# Patient Record
Sex: Male | Born: 1945 | Race: White | Hispanic: No | Marital: Single | Smoking: Current every day smoker
Health system: Southern US, Community
[De-identification: ages and names within clinical notes are randomized; demographics above are authoritative.]

## PROBLEM LIST (undated history)

## (undated) DIAGNOSIS — Z9989 Dependence on other enabling machines and devices: Secondary | ICD-10-CM

## (undated) DIAGNOSIS — R350 Frequency of micturition: Secondary | ICD-10-CM

## (undated) DIAGNOSIS — N210 Calculus in bladder: Secondary | ICD-10-CM

## (undated) DIAGNOSIS — F329 Major depressive disorder, single episode, unspecified: Secondary | ICD-10-CM

## (undated) DIAGNOSIS — Z973 Presence of spectacles and contact lenses: Secondary | ICD-10-CM

## (undated) DIAGNOSIS — G4733 Obstructive sleep apnea (adult) (pediatric): Secondary | ICD-10-CM

## (undated) DIAGNOSIS — E785 Hyperlipidemia, unspecified: Secondary | ICD-10-CM

## (undated) DIAGNOSIS — N281 Cyst of kidney, acquired: Secondary | ICD-10-CM

## (undated) DIAGNOSIS — Z87442 Personal history of urinary calculi: Secondary | ICD-10-CM

## (undated) DIAGNOSIS — F32A Depression, unspecified: Secondary | ICD-10-CM

## (undated) DIAGNOSIS — I10 Essential (primary) hypertension: Secondary | ICD-10-CM

## (undated) HISTORY — PX: TONSILLECTOMY: SUR1361

---

## 2000-02-19 ENCOUNTER — Encounter: Admission: RE | Admit: 2000-02-19 | Discharge: 2000-02-19 | Payer: Self-pay | Admitting: Neurology

## 2000-02-19 ENCOUNTER — Encounter: Payer: Self-pay | Admitting: Neurology

## 2004-02-12 HISTORY — PX: COLONOSCOPY: SHX174

## 2004-05-08 ENCOUNTER — Encounter: Admission: RE | Admit: 2004-05-08 | Discharge: 2004-05-08 | Payer: Self-pay | Admitting: Neurology

## 2008-02-29 ENCOUNTER — Emergency Department (HOSPITAL_COMMUNITY): Admission: EM | Admit: 2008-02-29 | Discharge: 2008-02-29 | Payer: Self-pay | Admitting: Emergency Medicine

## 2010-05-28 LAB — URINE MICROSCOPIC-ADD ON

## 2010-05-28 LAB — URINALYSIS, ROUTINE W REFLEX MICROSCOPIC
Bilirubin Urine: NEGATIVE
Glucose, UA: NEGATIVE mg/dL
Ketones, ur: NEGATIVE mg/dL
Nitrite: NEGATIVE
Protein, ur: NEGATIVE mg/dL
Specific Gravity, Urine: 1.022 (ref 1.005–1.030)
Urobilinogen, UA: 0.2 mg/dL (ref 0.0–1.0)
pH: 7 (ref 5.0–8.0)

## 2013-10-20 ENCOUNTER — Other Ambulatory Visit: Payer: Self-pay | Admitting: Internal Medicine

## 2013-10-20 DIAGNOSIS — R42 Dizziness and giddiness: Secondary | ICD-10-CM

## 2013-10-23 ENCOUNTER — Ambulatory Visit
Admission: RE | Admit: 2013-10-23 | Discharge: 2013-10-23 | Disposition: A | Discharge status: Home / Self Care | Payer: Medicare Other | Source: Ambulatory Visit | Attending: Internal Medicine | Admitting: Internal Medicine

## 2013-10-23 DIAGNOSIS — R42 Dizziness and giddiness: Secondary | ICD-10-CM

## 2013-10-28 ENCOUNTER — Ambulatory Visit
Admission: RE | Admit: 2013-10-28 | Discharge: 2013-10-28 | Disposition: A | Discharge status: Home / Self Care | Payer: Medicare Other | Source: Ambulatory Visit | Attending: Internal Medicine | Admitting: Internal Medicine

## 2013-10-28 ENCOUNTER — Encounter (INDEPENDENT_AMBULATORY_CARE_PROVIDER_SITE_OTHER): Payer: Self-pay

## 2013-10-28 DIAGNOSIS — R42 Dizziness and giddiness: Secondary | ICD-10-CM

## 2014-08-14 ENCOUNTER — Encounter (HOSPITAL_COMMUNITY): Payer: Self-pay | Admitting: Adult Health

## 2014-08-14 ENCOUNTER — Emergency Department (HOSPITAL_COMMUNITY): Payer: PPO

## 2014-08-14 ENCOUNTER — Emergency Department (HOSPITAL_COMMUNITY)
Admission: EM | Admit: 2014-08-14 | Discharge: 2014-08-14 | Disposition: A | Discharge status: Home / Self Care | Payer: PPO | Attending: Emergency Medicine | Admitting: Emergency Medicine

## 2014-08-14 DIAGNOSIS — Z720 Tobacco use: Secondary | ICD-10-CM | POA: Insufficient documentation

## 2014-08-14 DIAGNOSIS — M25511 Pain in right shoulder: Secondary | ICD-10-CM | POA: Insufficient documentation

## 2014-08-14 DIAGNOSIS — I10 Essential (primary) hypertension: Secondary | ICD-10-CM | POA: Diagnosis not present

## 2014-08-14 DIAGNOSIS — R202 Paresthesia of skin: Secondary | ICD-10-CM | POA: Insufficient documentation

## 2014-08-14 HISTORY — DX: Essential (primary) hypertension: I10

## 2014-08-14 MED ORDER — KETOROLAC TROMETHAMINE 60 MG/2ML IM SOLN
60.0000 mg | Freq: Once | INTRAMUSCULAR | Status: AC
  Administered 2014-08-14: 60 mg via INTRAMUSCULAR
  Filled 2014-08-14: qty 2

## 2014-08-14 MED ORDER — HYDROCODONE-ACETAMINOPHEN 5-325 MG PO TABS: 1.0000 | ORAL_TABLET | ORAL | Status: DC | PRN

## 2014-08-14 MED ORDER — PREDNISONE 20 MG PO TABS
60.0000 mg | ORAL_TABLET | Freq: Once | ORAL | Status: AC
  Administered 2014-08-14: 60 mg via ORAL
  Filled 2014-08-14: qty 3

## 2014-08-14 MED ORDER — IBUPROFEN 800 MG PO TABS: 800.0000 mg | ORAL_TABLET | Freq: Three times a day (TID) | ORAL | Status: DC

## 2014-08-14 MED ORDER — PREDNISONE 20 MG PO TABS: 40.0000 mg | ORAL_TABLET | Freq: Every day | ORAL | Status: DC

## 2014-08-14 NOTE — ED Provider Notes (Signed)
CSN: [id number]     Arrival date & time 08/14/14  1517 History  This chart was scribed for non-physician practitioner, Delsa Grana, PA-C, working with Quintella Reichert, MD, by Stephania Fragmin, ED Scribe. This patient was seen in room TR06C/TR06C and the patient's care was started at 4:34 PM.    Chief Complaint  Patient presents with  . Shoulder Pain   The history is provided by the patient. No language interpreter was used.    HPI Comments: Stephen Collins is a 69 y.o. male who presents to the Emergency Department complaining of constant, 9/10 right shoulder pain that began 3 weeks ago, resolved for a period of time, and returned yesterday, gradually worsening since. He complains of an associated tingling sensation in all his right fingers, although he adds he thinks this may be due to moving his right arm less from his discomfort. He denies any known trauma or injury. Patient states he does fly-fishing, which involves some repetitive motion, although he last fished 1 week ago. Certain movements exacerbate his pain. Patient took 2 Advil tablets this morning with only some temporary relief. He denies fever, chills, neck pain, back pain, SOB, or chest pain. Patient has NKDA.   Past Medical History  Diagnosis Date  . Hypertension    History reviewed. No pertinent past surgical history. History reviewed. No pertinent family history. History  Substance Use Topics  . Smoking status: Current Every Day Smoker    Types: Cigarettes  . Smokeless tobacco: Not on file  . Alcohol Use: Yes    Review of Systems  Constitutional: Negative for fever and chills.  Respiratory: Negative for shortness of breath.   Cardiovascular: Negative for chest pain.  Musculoskeletal: Positive for arthralgias (right shoulder pain). Negative for back pain and neck pain.   Allergies  Review of patient's allergies indicates no known allergies.  Home Medications   Prior to Admission medications   Not on File   BP 155/82  mmHg  Pulse 94  Temp(Src) 98.4 F (36.9 C) (Oral)  Resp 18  SpO2 94% Physical Exam  Constitutional: He is oriented to person, place, and time. He appears well-developed and well-nourished. No distress.  HENT:  Head: Normocephalic and atraumatic.  Right Ear: External ear normal.  Left Ear: External ear normal.  Nose: Nose normal.  Mouth/Throat: Oropharynx is clear and moist.  Eyes: Conjunctivae and EOM are normal. Pupils are equal, round, and reactive to light. Right eye exhibits no discharge. Left eye exhibits no discharge. No scleral icterus.  Neck: Normal range of motion. Neck supple. No tracheal deviation present.  Cardiovascular: Normal rate and regular rhythm.  Exam reveals no gallop and no friction rub.   No murmur heard. Pulses:      Radial pulses are 2+ on the right side, and 2+ on the left side.  Pulmonary/Chest: Effort normal and breath sounds normal. No stridor. No respiratory distress. He has no wheezes. He has no rales. He exhibits no tenderness.  Abdominal: Soft.  Musculoskeletal: He exhibits tenderness.       Cervical back: He exhibits normal range of motion, no tenderness, no bony tenderness, no swelling, no edema, no deformity and no spasm.       Thoracic back: Normal.  Sharp/dull intact upper extremities bilaterally Radial pulses 2+ Right shoulder No bony tenderness to clavicle, [place] joint, AC joint, and over scapula.  Tender over bicepital groove and glenohumeral joint.  No obvious effusion. No deformity.  No visible redness or swelling.  ROM limited  do to pain, normal passive ROM Right elbow normal ROM Right wrist normal ROM  Neurological: He is alert and oriented to person, place, and time. He exhibits normal muscle tone.  Skin: Skin is warm and dry. No rash noted. He is not diaphoretic. No erythema. No pallor.  Psychiatric: He has a normal mood and affect. His behavior is normal.  Nursing note and vitals reviewed.   ED Course  Procedures (including critical  care time)  DIAGNOSTIC STUDIES: Oxygen Saturation is 94% on RA, normal by my interpretation.    COORDINATION OF CARE: 4:41 PM - Discussed treatment plan with pt at bedside which includes Rx steroid taper (prednisone) and pain medication (Norco), as well as Toradol injection administered here to reduce inflammation, and pt agreed to plan.  Imaging Review Dg Shoulder Right  08/14/2014   CLINICAL DATA:  Right shoulder pain for 3 weeks.  EXAM: RIGHT SHOULDER - 2+ VIEW  COMPARISON:  None.  FINDINGS: There is no evidence of fracture or dislocation. There is no evidence of arthropathy or other focal bone abnormality. Soft tissues are unremarkable.  IMPRESSION: Negative right shoulder radiographs.   Electronically Signed   By: San Morelle M.D.   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

## 2014-08-14 NOTE — Discharge Instructions (Signed)
Acromioclavicular Injuries The acromioclavicular [facility]) joint is the joint in the shoulder. There are many bands of tissue (ligaments) that surround the [facility] bones and joints. These bands of tissue can tear, which can lead to sprains and separations. The bones of the [facility] joint can also break (fracture).  HOME CARE   Put ice on the injured area.  Put ice in a plastic bag.  Place a towel between your skin and the bag.  Leave the ice on for 15-20 minutes, 03-04 times a day.  Wear your sling as told by your doctor. Remove the sling before showering and bathing. Keep the shoulder in the same place as when the sling is on. Do not lift the arm.  Gently tighten your figure-eight splint (if applied) every day. Tighten it enough to keep the shoulders held back. There should be room to place your finger between your body and the strap. Loosen the splint right away if you lose feeling (numbness) or have tingling in your hands.  Only take medicine as told by your doctor.  Keep all follow-up visits with your doctor. GET HELP RIGHT AWAY IF:   Your medicine does not help your pain.  You have more puffiness (swelling) or your bruising gets worse rather than better.  You were unable to follow up as told by your doctor.  You have tingling or lose even more feeling in your arm, forearm, or hand.  Your arm is cold or pale.  You have more pain in the hand, forearm, or fingers. MAKE SURE YOU:   Understand these instructions.  Will watch your condition.  Will get help right away if you are not doing well or get worse. Document Released: 07/18/2009 Document Revised: 04/22/2011 Document Reviewed: 07/18/2009 [facility] Patient Information 2015 [place], [place]. This information is not intended to replace advice given to you by your health care provider. Make sure you discuss any questions you have with your health care provider.  Musculoskeletal Pain Musculoskeletal pain is muscle and boney aches and pains.  These pains can occur in any part of the body. Your caregiver may treat you without knowing the cause of the pain. They may treat you if blood or urine tests, X-rays, and other tests were normal.  CAUSES There is often not a definite cause or reason for these pains. These pains may be caused by a type of germ (virus). The discomfort may also come from overuse. Overuse includes working out too hard when your body is not fit. Boney aches also come from weather changes. Bone is sensitive to atmospheric pressure changes. HOME CARE INSTRUCTIONS   Ask when your test results will be ready. Make sure you get your test results.  Only take over-the-counter or prescription medicines for pain, discomfort, or fever as directed by your caregiver. If you were given medications for your condition, do not drive, operate machinery or power tools, or sign legal documents for 24 hours. Do not drink alcohol. Do not take sleeping pills or other medications that may interfere with treatment.  Continue all activities unless the activities cause more pain. When the pain lessens, slowly resume normal activities. Gradually increase the intensity and duration of the activities or exercise.  During periods of severe pain, bed rest may be helpful. Lay or sit in any position that is comfortable.  Putting ice on the injured area.  Put ice in a bag.  Place a towel between your skin and the bag.  Leave the ice on for 15 to 20  minutes, 3 to 4 times a day.  Follow up with your caregiver for continued problems and no reason can be found for the pain. If the pain becomes worse or does not go away, it may be necessary to repeat tests or do additional testing. Your caregiver may need to look further for a possible cause. SEEK IMMEDIATE MEDICAL CARE IF:  You have pain that is getting worse and is not relieved by medications.  You develop chest pain that is associated with shortness or breath, sweating, feeling sick to your stomach  (nauseous), or throw up (vomit).  Your pain becomes localized to the abdomen.  You develop any new symptoms that seem different or that concern you. MAKE SURE YOU:   Understand these instructions.  Will watch your condition.  Will get help right away if you are not doing well or get worse. Document Released: 01/28/2005 Document Revised: 04/22/2011 Document Reviewed: 10/02/2012 [facility] Patient Information 2015 [place], [place]. This information is not intended to replace advice given to you by your health care provider. Make sure you discuss any questions you have with your health care provider.  Heat Therapy Heat therapy can help ease sore, stiff, injured, and tight muscles and joints. Heat relaxes your muscles, which may help ease your pain.  RISKS AND COMPLICATIONS If you have any of the following conditions, do not use heat therapy unless your health care provider has approved:  Poor circulation.  Healing wounds or scarred skin in the area being treated.  Diabetes, heart disease, or high blood pressure.  Not being able to feel (numbness) the area being treated.  Unusual swelling of the area being treated.  Active infections.  Blood clots.  Cancer.  Inability to communicate pain. This may include young children and people who have problems with their brain function (dementia).  Pregnancy. Heat therapy should only be used on old, pre-existing, or long-lasting (chronic) injuries. Do not use heat therapy on new injuries unless directed by your health care provider. HOW TO USE HEAT THERAPY There are several different kinds of heat therapy, including:  Moist heat pack.  Warm water bath.  Hot water bottle.  Electric heating pad.  Heated gel pack.  Heated wrap.  Electric heating pad. Use the heat therapy method suggested by your health care provider. Follow your health care provider's instructions on when and how to use heat therapy. GENERAL HEAT THERAPY  RECOMMENDATIONS  Do not sleep while using heat therapy. Only use heat therapy while you are awake.  Your skin may turn pink while using heat therapy. Do not use heat therapy if your skin turns red.  Do not use heat therapy if you have new pain.  High heat or long exposure to heat can cause burns. Be careful when using heat therapy to avoid burning your skin.  Do not use heat therapy on areas of your skin that are already irritated, such as with a rash or sunburn. SEEK MEDICAL CARE IF:  You have blisters, redness, swelling, or numbness.  You have new pain.  Your pain is worse. MAKE SURE YOU:  Understand these instructions.  Will watch your condition.  Will get help right away if you are not doing well or get worse. Document Released: 04/22/2011 Document Revised: 06/14/2013 Document Reviewed: 03/23/2013 [facility] Patient Information 2015 [place], [place]. This information is not intended to replace advice given to you by your health care provider. Make sure you discuss any questions you have with your health care provider.  Shoulder Pain  The shoulder is the joint that connects your arm to your body. Muscles and band-like tissues that connect bones to muscles (tendons) hold the joint together. Shoulder pain is felt if an injury or medical problem affects one or more parts of the shoulder. HOME CARE   Put ice on the sore area.  Put ice in a plastic bag.  Place a towel between your skin and the bag.  Leave the ice on for 15-20 minutes, 03-04 times a day for the first 2 days.  Stop using cold packs if they do not help with the pain.  If you were given something to keep your shoulder from moving (sling; shoulder immobilizer), wear it as told. Only take it off to shower or bathe.  Move your arm as little as possible, but keep your hand moving to prevent puffiness (swelling).  Squeeze a soft ball or foam pad as much as possible to help prevent swelling.  Take medicine as told by  your doctor. GET HELP IF:  You have progressing new pain in your arm, hand, or fingers.  Your hand or fingers get cold.  Your medicine does not help lessen your pain. GET HELP RIGHT AWAY IF:   Your arm, hand, or fingers are numb or tingling.  Your arm, hand, or fingers are puffy (swollen), painful, or turn white or blue. MAKE SURE YOU:   Understand these instructions.  Will watch your condition.  Will get help right away if you are not doing well or get worse. Document Released: 07/17/2007 Document Revised: 06/14/2013 Document Reviewed: 08/12/2011 [facility] Patient Information 2015 [place], [place]. This information is not intended to replace advice given to you by your health care provider. Make sure you discuss any questions you have with your health care provider.

## 2014-08-14 NOTE — ED Notes (Addendum)
Presents with right shoulder and arm pain began a few weeks ago but has gotten worse-last night became severe and reports a slight tingling in hand denies pain in chest, denies SOB, denies nausea. Tried ice and ibuprofen with no relief. Raising shoulder up makes pain worse-denies injury

## 2014-08-14 NOTE — ED Notes (Signed)
Declined W/C at D/C and was escorted to lobby by RN. 

## 2015-02-27 DIAGNOSIS — N210 Calculus in bladder: Secondary | ICD-10-CM | POA: Diagnosis not present

## 2015-02-27 DIAGNOSIS — R3121 Asymptomatic microscopic hematuria: Secondary | ICD-10-CM | POA: Diagnosis not present

## 2015-02-28 ENCOUNTER — Other Ambulatory Visit: Payer: Self-pay | Admitting: Urology

## 2015-04-05 DIAGNOSIS — I10 Essential (primary) hypertension: Secondary | ICD-10-CM | POA: Diagnosis not present

## 2015-04-05 DIAGNOSIS — N210 Calculus in bladder: Secondary | ICD-10-CM | POA: Diagnosis not present

## 2015-04-05 DIAGNOSIS — E119 Type 2 diabetes mellitus without complications: Secondary | ICD-10-CM | POA: Diagnosis not present

## 2015-04-05 DIAGNOSIS — L989 Disorder of the skin and subcutaneous tissue, unspecified: Secondary | ICD-10-CM | POA: Diagnosis not present

## 2015-04-05 DIAGNOSIS — Z6833 Body mass index (BMI) 33.0-33.9, adult: Secondary | ICD-10-CM | POA: Diagnosis not present

## 2015-04-05 DIAGNOSIS — R5383 Other fatigue: Secondary | ICD-10-CM | POA: Diagnosis not present

## 2015-04-05 DIAGNOSIS — E784 Other hyperlipidemia: Secondary | ICD-10-CM | POA: Diagnosis not present

## 2015-04-05 DIAGNOSIS — G4733 Obstructive sleep apnea (adult) (pediatric): Secondary | ICD-10-CM | POA: Diagnosis not present

## 2015-04-07 ENCOUNTER — Encounter (HOSPITAL_BASED_OUTPATIENT_CLINIC_OR_DEPARTMENT_OTHER): Payer: Self-pay | Admitting: *Deleted

## 2015-04-07 DIAGNOSIS — N210 Calculus in bladder: Secondary | ICD-10-CM | POA: Diagnosis not present

## 2015-04-10 ENCOUNTER — Encounter (HOSPITAL_BASED_OUTPATIENT_CLINIC_OR_DEPARTMENT_OTHER): Payer: Self-pay | Admitting: *Deleted

## 2015-04-10 NOTE — Progress Notes (Signed)
NPO AFTER MN.  ARRIVE AT 0715.  NEEDS ISTAT AND EKG.  WILL TAKE AM MEDS W/ SIPS OF WATER DOS.

## 2015-04-14 ENCOUNTER — Encounter (HOSPITAL_BASED_OUTPATIENT_CLINIC_OR_DEPARTMENT_OTHER): Payer: Self-pay | Admitting: *Deleted

## 2015-04-14 ENCOUNTER — Ambulatory Visit (HOSPITAL_BASED_OUTPATIENT_CLINIC_OR_DEPARTMENT_OTHER)
Admission: RE | Admit: 2015-04-14 | Discharge: 2015-04-14 | Disposition: A | Discharge status: Home / Self Care | Payer: PPO | Source: Ambulatory Visit | Attending: Urology | Admitting: Urology

## 2015-04-14 ENCOUNTER — Other Ambulatory Visit: Payer: Self-pay

## 2015-04-14 ENCOUNTER — Ambulatory Visit (HOSPITAL_BASED_OUTPATIENT_CLINIC_OR_DEPARTMENT_OTHER): Payer: PPO | Admitting: Certified Registered"

## 2015-04-14 ENCOUNTER — Encounter (HOSPITAL_BASED_OUTPATIENT_CLINIC_OR_DEPARTMENT_OTHER)
Admission: RE | Disposition: A | Discharge status: Home / Self Care | Payer: Self-pay | Source: Ambulatory Visit | Attending: Urology

## 2015-04-14 DIAGNOSIS — I10 Essential (primary) hypertension: Secondary | ICD-10-CM | POA: Diagnosis not present

## 2015-04-14 DIAGNOSIS — R300 Dysuria: Secondary | ICD-10-CM | POA: Diagnosis not present

## 2015-04-14 DIAGNOSIS — N210 Calculus in bladder: Secondary | ICD-10-CM | POA: Diagnosis not present

## 2015-04-14 DIAGNOSIS — G4730 Sleep apnea, unspecified: Secondary | ICD-10-CM | POA: Insufficient documentation

## 2015-04-14 DIAGNOSIS — Z87891 Personal history of nicotine dependence: Secondary | ICD-10-CM | POA: Insufficient documentation

## 2015-04-14 DIAGNOSIS — F329 Major depressive disorder, single episode, unspecified: Secondary | ICD-10-CM | POA: Insufficient documentation

## 2015-04-14 DIAGNOSIS — E669 Obesity, unspecified: Secondary | ICD-10-CM | POA: Diagnosis not present

## 2015-04-14 DIAGNOSIS — Z6832 Body mass index (BMI) 32.0-32.9, adult: Secondary | ICD-10-CM | POA: Insufficient documentation

## 2015-04-14 DIAGNOSIS — Z79899 Other long term (current) drug therapy: Secondary | ICD-10-CM | POA: Insufficient documentation

## 2015-04-14 DIAGNOSIS — E119 Type 2 diabetes mellitus without complications: Secondary | ICD-10-CM | POA: Diagnosis not present

## 2015-04-14 DIAGNOSIS — E785 Hyperlipidemia, unspecified: Secondary | ICD-10-CM | POA: Diagnosis not present

## 2015-04-14 DIAGNOSIS — R3915 Urgency of urination: Secondary | ICD-10-CM | POA: Diagnosis not present

## 2015-04-14 HISTORY — DX: Calculus in bladder: N21.0

## 2015-04-14 HISTORY — DX: Dependence on other enabling machines and devices: Z99.89

## 2015-04-14 HISTORY — PX: CYSTOSCOPY WITH LITHOLAPAXY: SHX1425

## 2015-04-14 HISTORY — DX: Obstructive sleep apnea (adult) (pediatric): G47.33

## 2015-04-14 HISTORY — DX: Frequency of micturition: R35.0

## 2015-04-14 HISTORY — DX: Presence of spectacles and contact lenses: Z97.3

## 2015-04-14 HISTORY — DX: Major depressive disorder, single episode, unspecified: F32.9

## 2015-04-14 HISTORY — DX: Hyperlipidemia, unspecified: E78.5

## 2015-04-14 HISTORY — DX: Depression, unspecified: F32.A

## 2015-04-14 HISTORY — DX: Cyst of kidney, acquired: N28.1

## 2015-04-14 HISTORY — DX: Personal history of urinary calculi: Z87.442

## 2015-04-14 LAB — POCT I-STAT 4, (NA,K, GLUC, HGB,HCT)
Glucose, Bld: 122 mg/dL — ABNORMAL HIGH (ref 65–99)
HCT: 46 % (ref 39.0–52.0)
HEMOGLOBIN: 15.6 g/dL (ref 13.0–17.0)
POTASSIUM: 4 mmol/L (ref 3.5–5.1)
Sodium: 142 mmol/L (ref 135–145)

## 2015-04-14 SURGERY — CYSTOSCOPY, WITH BLADDER CALCULUS LITHOLAPAXY
Anesthesia: General

## 2015-04-14 MED ORDER — FENTANYL CITRATE (PF) 100 MCG/2ML IJ SOLN
INTRAMUSCULAR | Status: AC
Start: 2015-04-14 — End: 2015-04-14
  Filled 2015-04-14: qty 2

## 2015-04-14 MED ORDER — LIDOCAINE HCL (CARDIAC) 20 MG/ML IV SOLN
INTRAVENOUS | Status: DC | PRN
  Administered 2015-04-14: 60 mg via INTRAVENOUS

## 2015-04-14 MED ORDER — DEXAMETHASONE SODIUM PHOSPHATE 10 MG/ML IJ SOLN
INTRAMUSCULAR | Status: AC
  Filled 2015-04-14: qty 1

## 2015-04-14 MED ORDER — CIPROFLOXACIN IN D5W 400 MG/200ML IV SOLN
INTRAVENOUS | Status: AC
  Filled 2015-04-14: qty 200

## 2015-04-14 MED ORDER — PROPOFOL 10 MG/ML IV BOLUS
INTRAVENOUS | Status: AC
  Filled 2015-04-14: qty 20

## 2015-04-14 MED ORDER — STERILE WATER FOR IRRIGATION IR SOLN
Status: DC | PRN
  Administered 2015-04-14: 1000 mL via INTRAVESICAL
  Administered 2015-04-14: 500 mL via INTRAVESICAL
  Administered 2015-04-14: 3000 mL via INTRAVESICAL

## 2015-04-14 MED ORDER — TRAMADOL HCL 50 MG PO TABS
50.0000 mg | ORAL_TABLET | Freq: Once | ORAL | Status: DC
  Filled 2015-04-14: qty 1

## 2015-04-14 MED ORDER — LIDOCAINE HCL 2 % EX GEL
CUTANEOUS | Status: DC | PRN
  Administered 2015-04-14: 1 via URETHRAL

## 2015-04-14 MED ORDER — EPHEDRINE SULFATE 50 MG/ML IJ SOLN
INTRAMUSCULAR | Status: DC | PRN
  Administered 2015-04-14: 15 mg via INTRAVENOUS
  Administered 2015-04-14 (×2): 10 mg via INTRAVENOUS

## 2015-04-14 MED ORDER — LACTATED RINGERS IV SOLN
INTRAVENOUS | Status: DC
  Administered 2015-04-14: 08:00:00 via INTRAVENOUS
  Filled 2015-04-14: qty 1000

## 2015-04-14 MED ORDER — PROPOFOL 10 MG/ML IV BOLUS
INTRAVENOUS | Status: DC | PRN
  Administered 2015-04-14: 150 mg via INTRAVENOUS
  Administered 2015-04-14: 50 mg via INTRAVENOUS

## 2015-04-14 MED ORDER — OXYCODONE HCL 5 MG/5ML PO SOLN
5.0000 mg | Freq: Once | ORAL | Status: DC | PRN
  Filled 2015-04-14: qty 5

## 2015-04-14 MED ORDER — LIDOCAINE HCL (CARDIAC) 20 MG/ML IV SOLN
INTRAVENOUS | Status: AC
  Filled 2015-04-14: qty 5

## 2015-04-14 MED ORDER — ONDANSETRON HCL 4 MG/2ML IJ SOLN
4.0000 mg | Freq: Four times a day (QID) | INTRAMUSCULAR | Status: DC | PRN
  Filled 2015-04-14: qty 2

## 2015-04-14 MED ORDER — PHENAZOPYRIDINE HCL 200 MG PO TABS: 200.0000 mg | ORAL_TABLET | Freq: Three times a day (TID) | ORAL | Status: DC | PRN

## 2015-04-14 MED ORDER — PHENAZOPYRIDINE HCL 200 MG PO TABS
200.0000 mg | ORAL_TABLET | Freq: Once | ORAL | Status: DC
  Filled 2015-04-14: qty 1

## 2015-04-14 MED ORDER — FENTANYL CITRATE (PF) 100 MCG/2ML IJ SOLN
INTRAMUSCULAR | Status: DC | PRN
Start: 2015-04-14 — End: 2015-04-14
  Administered 2015-04-14: 25 ug via INTRAVENOUS
  Administered 2015-04-14: 50 ug via INTRAVENOUS

## 2015-04-14 MED ORDER — OXYCODONE HCL 5 MG PO TABS
5.0000 mg | ORAL_TABLET | Freq: Once | ORAL | Status: DC | PRN
  Filled 2015-04-14: qty 1

## 2015-04-14 MED ORDER — FENTANYL CITRATE (PF) 100 MCG/2ML IJ SOLN
25.0000 ug | INTRAMUSCULAR | Status: DC | PRN
  Filled 2015-04-14: qty 1

## 2015-04-14 MED ORDER — CIPROFLOXACIN IN D5W 400 MG/200ML IV SOLN
400.0000 mg | INTRAVENOUS | Status: AC
  Administered 2015-04-14: 400 mg via INTRAVENOUS
  Filled 2015-04-14: qty 200

## 2015-04-14 MED ORDER — ONDANSETRON HCL 4 MG/2ML IJ SOLN
INTRAMUSCULAR | Status: AC
  Filled 2015-04-14: qty 2

## 2015-04-14 MED ORDER — TRAMADOL HCL 50 MG PO TABS: 50.0000 mg | ORAL_TABLET | Freq: Four times a day (QID) | ORAL | Status: DC | PRN

## 2015-04-14 MED ORDER — SODIUM CHLORIDE 0.9 % IR SOLN
Status: DC | PRN
  Administered 2015-04-14 (×3): 1000 mL via INTRAVESICAL
  Administered 2015-04-14: 3000 mL via INTRAVESICAL
  Administered 2015-04-14 (×5): 1000 mL via INTRAVESICAL

## 2015-04-14 MED ORDER — DEXAMETHASONE SODIUM PHOSPHATE 4 MG/ML IJ SOLN
INTRAMUSCULAR | Status: DC | PRN
  Administered 2015-04-14: 4 mg via INTRAVENOUS
  Administered 2015-04-14: 10 mg via INTRAVENOUS

## 2015-04-14 SURGICAL SUPPLY — 21 items
BAG DRAIN URO-CYSTO SKYTR STRL (DRAIN) ×2 IMPLANT
CANISTER SUCT LVC 12 LTR MEDI- (MISCELLANEOUS) IMPLANT
CLOTH BEACON ORANGE TIMEOUT ST (SAFETY) ×2 IMPLANT
DRSG TELFA 3X8 NADH (GAUZE/BANDAGES/DRESSINGS) IMPLANT
ELECT REM PT RETURN 9FT ADLT (ELECTROSURGICAL)
ELECTRODE REM PT RTRN 9FT ADLT (ELECTROSURGICAL) IMPLANT
EVACUATOR MICROVAS BLADDER (UROLOGICAL SUPPLIES) ×2 IMPLANT
FIBER LASER FLEXIVA 1000 (UROLOGICAL SUPPLIES) ×2 IMPLANT
GLOVE BIO SURGEON STRL SZ7.5 (GLOVE) ×2 IMPLANT
GOWN STRL REUS W/ TWL XL LVL3 (GOWN DISPOSABLE) ×1 IMPLANT
GOWN STRL REUS W/TWL XL LVL3 (GOWN DISPOSABLE) ×1
IV NS 1000ML (IV SOLUTION) ×9
IV NS 1000ML BAXH (IV SOLUTION) ×9 IMPLANT
IV NS IRRIG 3000ML ARTHROMATIC (IV SOLUTION) ×2 IMPLANT
KIT ROOM TURNOVER WOR (KITS) ×2 IMPLANT
MANIFOLD NEPTUNE II (INSTRUMENTS) ×2 IMPLANT
NS IRRIG 500ML POUR BTL (IV SOLUTION) IMPLANT
PACK CYSTO (CUSTOM PROCEDURE TRAY) ×2 IMPLANT
TUBE CONNECTING 12X1/4 (SUCTIONS) ×2 IMPLANT
WATER STERILE IRR 3000ML UROMA (IV SOLUTION) ×2 IMPLANT
WATER STERILE IRR 500ML POUR (IV SOLUTION) ×2 IMPLANT

## 2015-04-14 NOTE — Op Note (Signed)
Preoperative diagnosis:  1. Bladder stones   Postoperative diagnosis:  1. same   Procedure:  1. Cystolithalopaxy > 2.0cm  Surgeon: Ardis Hughs, MD  Anesthesia: Gen.   Complications: None  Intraoperative findings:  the patient had 2 large bladder stones approximately 1.5 cm each and several small stones within his bladder. All stone fragments were removed. The patient had kissing obstructing lateral lobes of his prostate as well as a very high median bar.  EBL: Minimal  Specimens:  The bladder stones were taken to the [facility] for further stone analysis.   Indication: Stephen Collins is a 70 y.o. patient with  large bladder stones that were found as part of a hematuria workup.   After reviewing the management options for treatment, he elected to proceed with the above surgical procedure(s). We have discussed the potential benefits and risks of the procedure, side effects of the proposed treatment, the likelihood of the patient achieving the goals of the procedure, and any potential problems that might occur during the procedure or recuperation. Informed consent has been obtained.  The patient did decline performing a TURP simultaneously.   Description of procedure:  The patient was taken to the operating room and general anesthesia was induced.  The patient was placed in the dorsal lithotomy position, prepped and draped in the usual sterile fashion, and preoperative antibiotics were administered. A preoperative time-out was performed.   Cystourethroscopy was performed.  The patient's urethra was examined and was normal bilobar prostatic hypertrophy with a median lobe. The bladder was then systematically examined in its entirety. There was no evidence for any bladder tumors or other mucosal pathology.   The bladder stones were noted as above.    Then using the 1000  laser fiber with settings of 1 J and 50 Hz the bladder stones were subsequently and systematically  fragmented into small enough fragments as they could be evacuated through the cystoscopic sheath. Once the stone fragments had been completely removed, I then performed a second cystoscopic evaluation to ensure that there were no additional tumors or mucosal abnormalities. There were several small laser burns on the posterior wall, these were minor in nature.  The bladder was then emptied, the scope removed,  and the procedure ended.  The patient appeared to tolerate the procedure well and without complications.  The patient was able to be awakened and transferred to the recovery unit in satisfactory condition.    Ardis Hughs, M.D.

## 2015-04-14 NOTE — Anesthesia Procedure Notes (Addendum)
Procedure Name: LMA Insertion Date/Time: 04/14/2015 8:42 AM Performed by: Denna Haggard D Pre-anesthesia Checklist: Patient identified, Emergency Drugs available, Suction available and Patient being monitored Patient Re-evaluated:Patient Re-evaluated prior to inductionOxygen Delivery Method: Circle System Utilized Preoxygenation: Pre-oxygenation with 100% oxygen Intubation Type: IV induction Ventilation: Mask ventilation without difficulty LMA: LMA inserted LMA Size: 4.0 Number of attempts: 1 Airway Equipment and Method: bite block Placement Confirmation: positive ETCO2 Tube secured with: Tape Dental Injury: Teeth and Oropharynx as per pre-operative assessment

## 2015-04-14 NOTE — Anesthesia Preprocedure Evaluation (Signed)
Anesthesia Evaluation  Patient identified by MRN, date of birth, ID band Patient awake    Reviewed: Allergy & Precautions, NPO status , Patient's Chart, lab work & pertinent test results  Airway Mallampati: II   Neck ROM: full    Dental   Pulmonary sleep apnea and Continuous Positive Airway Pressure Ventilation , Current Smoker,    breath sounds clear to auscultation       Cardiovascular hypertension,  Rhythm:regular Rate:Normal     Neuro/Psych Depression    GI/Hepatic   Endo/Other  obese  Renal/GU Kidney stones   Bladder stones    Musculoskeletal   Abdominal   Peds  Hematology   Anesthesia Other Findings   Reproductive/Obstetrics                             Anesthesia Physical Anesthesia Plan  ASA: II  Anesthesia Plan: General   Post-op Pain Management:    Induction: Intravenous  Airway Management Planned: LMA  Additional Equipment:   Intra-op Plan:   Post-operative Plan:   Informed Consent: I have reviewed the patients History and Physical, chart, labs and discussed the procedure including the risks, benefits and alternatives for the proposed anesthesia with the patient or authorized representative who has indicated his/her understanding and acceptance.     Plan Discussed with: CRNA, Anesthesiologist and Surgeon  Anesthesia Plan Comments:         Anesthesia Quick Evaluation

## 2015-04-14 NOTE — H&P (Deleted)
Reason For Visit f/u for microscopic hematuria   History of Present Illness 11M referred by Dr. Berneta Sages, MD for further eval and management of microscopic hematuria.    Patient states that he has not had any significant progression of his voiding symptoms over the last 6 months. He does state that he may have some worsening urinary frequency. However, his nocturia is stable at 1-2 times per night, he has a strong stream, feels that he empties his bladder completely, and has persistent urgency. The patient's urge is nothing new, he denies incontinence associated with it. Patient denies any weight loss, night sweats, but has chronic fatigue.   PSA was 2.5 in 11/2014 - up from 1.6 in 11/2013. normal DRE  Cysto 12/16 - three small bladder stones, largest one ~2cm  CT 12/16 - bilateral small non-obstructing stones and bladder stones  Interval: Since the patient was last seen, his voiding symptoms have been somewhat progressive. He now complains of urinary urgency and dysuria. He has not had any fevers or chills. He does not have any flank pain. Has not had any gross hematuria.   Past Medical History Problems  1. History of diabetes mellitus (Z86.39) 2. History of hyperlipidemia (Z86.39) 3. History of hypertension (Z86.79) 4. History of sleep apnea (Z86.69)  Surgical History Problems  1. History of Rhinoplasty  Current Meds 1. Atorvastatin Calcium 20 MG Oral Tablet;  Therapy: (Recorded:15Dec2016) to Recorded 2. BuPROPion HCl ER (SR) 150 MG Oral Tablet Extended Release 12 Hour;  Therapy: (Recorded:15Dec2016) to Recorded 3. Losartan Potassium 100 MG Oral Tablet;  Therapy: (Recorded:15Dec2016) to Recorded 4. Wellbutrin XL 150 MG Oral Tablet Extended Release 24 Hour;  Therapy: (Recorded:15Dec2016) to Recorded  Allergies Medication  1. No Known Drug Allergies  Family History Problems  1. Family history of Death of family member : Mother, Father   Mother at age 25; natural  causesFather at age 31; stroke; cva 2. Family history of cerebrovascular accident (CVA) (Z82.3) : Father  Social History Problems  1. Alcohol use (Z78.9)   3 2. Caffeine use (F15.90)   2 3. Divorced 4. Former smoker [phone]   1 ppd for 40 years and quit in 2011 5. Number of children   2 daughters 6. Occupation   Self- employed  Review of Systems No changes in pts bowel habits, neurological changes, or progressive lower urinary tract symptoms.      Vitals Vital Signs [Data Includes: Last 1 Day]  Recorded: [id number] 03:09PM  Blood Pressure: 150 / 87 Heart Rate: 84  Physical Exam Constitutional: Well nourished and well developed . No acute distress.  ENT:. The ears and nose are normal in appearance.  Neck: The appearance of the neck is normal and no neck mass is present.  Pulmonary: No respiratory distress and normal respiratory rhythm and effort.  Cardiovascular: Heart rate and rhythm are normal . No peripheral edema.  Abdomen: The abdomen is soft and nontender. No masses are palpated. No CVA tenderness. No hernias are palpable. No hepatosplenomegaly noted.  Skin: Normal skin turgor, no visible rash and no visible skin lesions.  Neuro/Psych:. Mood and affect are appropriate.    Results/Data Urine [Data Includes: Last 1 Day]   [id number]  COLOR YELLOW   APPEARANCE CLOUDY   SPECIFIC GRAVITY 1.025   pH 5.0   GLUCOSE NEGATIVE   BILIRUBIN NEGATIVE   KETONE NEGATIVE   BLOOD 3+   PROTEIN NEGATIVE   NITRITE NEGATIVE   LEUKOCYTE ESTERASE NEGATIVE   SQUAMOUS EPITHELIAL/HPF 0-5 HPF  WBC NONE SEEN WBC/HPF  RBC 40-60 RBC/HPF  BACTERIA NONE SEEN HPF  CRYSTALS NONE SEEN HPF  CASTS NONE SEEN LPF  Yeast NONE SEEN HPF   Urinalysis today demonstrates microscopic hematuria without evidence of infection   Assessment Assessed  1. Bladder calculus (N21.0)  Plan Asymptomatic microscopic hematuria  1. Follow-up Schedule Surgery Office  Follow-up  Status: Complete   Done: [id number] 2. URINE CULTURE; Status:Canceled - Specimen/Data Collection,Appointment;  Health Maintenance  3. UA With REFLEX; [Do Not Release]; Status:Complete;   [id number] 02:31PM  Discussion/Summary I went over the patient's CT scan with him quite some detail. We discussed the small nonobstructing bilateral nephrolithiasis. Recommended that we continue to watch these, but would not recommend any intervention at this time. However, I think that he should have his bladder stones removed. I described the surgery for him which would involve general anesthesia, cystoscopy, and laser lithotripsy. We also discussed the option of concomitant TURP as this is almost certainly result of incomplete bladder emptying. However, the patient's symptoms are minimal from an obstructing prostate standpoint and as such, the patient has declined this part of the operation. We will hopefully get this scheduled ASAP.

## 2015-04-14 NOTE — H&P (Signed)
Reason For Visit f/u for microscopic hematuria   History of Present Illness 89M referred by Dr. Berneta Sages, MD for further eval and management of microscopic hematuria.    Patient states that he has not had any significant progression of his voiding symptoms over the last 6 months. He does state that he may have some worsening urinary frequency. However, his nocturia is stable at 1-2 times per night, he has a strong stream, feels that he empties his bladder completely, and has persistent urgency. The patient's urge is nothing new, he denies incontinence associated with it. Patient denies any weight loss, night sweats, but has chronic fatigue.   PSA was 2.5 in 11/2014 - up from 1.6 in 11/2013. normal DRE  Cysto 12/16 - three small bladder stones, largest one ~2cm  CT 12/16 - bilateral small non-obstructing stones and bladder stones  Interval: Since the patient was last seen, his voiding symptoms have been somewhat progressive. He now complains of urinary urgency and dysuria. He has not had any fevers or chills. He does not have any flank pain. Has not had any gross hematuria.   Past Medical History Problems  1. History of diabetes mellitus (Z86.39) 2. History of hyperlipidemia (Z86.39) 3. History of hypertension (Z86.79) 4. History of sleep apnea (Z86.69)  Surgical History Problems  1. History of Rhinoplasty  Current Meds 1. Atorvastatin Calcium 20 MG Oral Tablet;  Therapy: (Recorded:15Dec2016) to Recorded 2. BuPROPion HCl ER (SR) 150 MG Oral Tablet Extended Release 12 Hour;  Therapy: (Recorded:15Dec2016) to Recorded 3. Losartan Potassium 100 MG Oral Tablet;  Therapy: (Recorded:15Dec2016) to Recorded 4. Wellbutrin XL 150 MG Oral Tablet Extended Release 24 Hour;  Therapy: (Recorded:15Dec2016) to Recorded  Allergies Medication  1. No Known Drug Allergies  Family History Problems  1. Family history of Death of family member : Mother, Father   Mother at age 71; natural  causesFather at age 71; stroke; cva 2. Family history of cerebrovascular accident (CVA) (Z82.3) : Father  Social History Problems  1. Alcohol use (Z78.9)   3 2. Caffeine use (F15.90)   2 3. Divorced 4. Former smoker [phone])   1 ppd for 40 years and quit in 2011 5. Number of children   2 daughters 6. Occupation   Self- employed  Review of Systems No changes in pts bowel habits, neurological changes, or progressive lower urinary tract symptoms.      Vitals Vital Signs [Data Includes: Last 1 Day]  Recorded: [id number] 03:09PM  Blood Pressure: 150 / 87 Heart Rate: 84  Physical Exam Constitutional: Well nourished and well developed . No acute distress.  ENT:. The ears and nose are normal in appearance.  Neck: The appearance of the neck is normal and no neck mass is present.  Pulmonary: No respiratory distress and normal respiratory rhythm and effort.  Cardiovascular: Heart rate and rhythm are normal . No peripheral edema.  Abdomen: The abdomen is soft and nontender. No masses are palpated. No CVA tenderness. No hernias are palpable. No hepatosplenomegaly noted.  Skin: Normal skin turgor, no visible rash and no visible skin lesions.  Neuro/Psych:. Mood and affect are appropriate.    Results/Data Urine [Data Includes: Last 1 Day]   [id number]  COLOR YELLOW   APPEARANCE CLOUDY   SPECIFIC GRAVITY 1.025   pH 5.0   GLUCOSE NEGATIVE   BILIRUBIN NEGATIVE   KETONE NEGATIVE   BLOOD 3+   PROTEIN NEGATIVE   NITRITE NEGATIVE   LEUKOCYTE ESTERASE NEGATIVE   SQUAMOUS EPITHELIAL/HPF 0-5 HPF  WBC NONE SEEN WBC/HPF  RBC 40-60 RBC/HPF  BACTERIA NONE SEEN HPF  CRYSTALS NONE SEEN HPF  CASTS NONE SEEN LPF  Yeast NONE SEEN HPF   Urinalysis today demonstrates microscopic hematuria without evidence of infection   Assessment Assessed  1. Bladder calculus (N21.0)  Plan Asymptomatic microscopic hematuria  1. Follow-up Schedule Surgery Office  Follow-up  Status: Complete   Done: [id number] 2. URINE CULTURE; Status:Canceled - Specimen/Data Collection,Appointment;  Health Maintenance  3. UA With REFLEX; [Do Not Release]; Status:Complete;   [id number] 02:31PM  Discussion/Summary I went over the patient's CT scan with him quite some detail. We discussed the small nonobstructing bilateral nephrolithiasis. Recommended that we continue to watch these, but would not recommend any intervention at this time. However, I think that he should have his bladder stones removed. I described the surgery for him which would involve general anesthesia, cystoscopy, and laser lithotripsy. We also discussed the option of concomitant TURP as this is almost certainly result of incomplete bladder emptying. However, the patient's symptoms are minimal from an obstructing prostate standpoint and as such, the patient has declined this part of the operation. We will hopefully get this scheduled ASAP.

## 2015-04-14 NOTE — Interval H&P Note (Signed)
History and Physical Interval Note:  04/14/2015 8:26 AM  Stephen Collins  has presented today for surgery, with the diagnosis of BLADDER STONE  The various methods of treatment have been discussed with the patient and family. After consideration of risks, benefits and other options for treatment, the patient has consented to  Procedure(s): CYSTOSCOPY WITH LITHOLAPAXY (N/A) as a surgical intervention .  The patient's history has been reviewed, patient examined, no change in status, stable for surgery.  I have reviewed the patient's chart and labs.  Questions were answered to the patient's satisfaction.     Louis Meckel W

## 2015-04-14 NOTE — Transfer of Care (Signed)
Immediate Anesthesia Transfer of Care Note  Patient: Stephen Collins  Procedure(s) Performed: Procedure(s) (LRB): CYSTOSCOPY WITH LITHOLAPAXY (N/A)  Patient Location: PACU  Anesthesia Type: General  Level of Consciousness: awake, oriented, sedated and patient cooperative  Airway & Oxygen Therapy: Patient Spontanous Breathing and Patient connected to face mask oxygen  Post-op Assessment: Report given to PACU RN and Post -op Vital signs reviewed and stable  Post vital signs: Reviewed and stable  Complications: No apparent anesthesia complications

## 2015-04-14 NOTE — Discharge Instructions (Signed)
Transurethral Removal of Bladder stones  General instructions:     Your recent bladder surgery requires very little post hospital care but some definite precautions.  Despite the fact that no skin incisions were used, the area around the bladder incisions are raw and covered with scabs to promote healing and prevent bleeding. Certain precautions are needed to insure that the scabs are not disturbed over the next 2-4 weeks while the healing proceeds.  Because the raw surface inside your bladder and the irritating effects of urine you may expect frequency of urination and/or urgency (a stronger desire to urinate) and perhaps even getting up at night more often. This will usually resolve or improve slowly over the healing period. You may see some blood in your urine over the first 6 weeks. Do not be alarmed, even if the urine was clear for a while. Get off your feet and drink lots of fluids until clearing occurs. If you start to pass clots or don't improve call us.  Diet:  You may return to your normal diet immediately. Because of the raw surface of your bladder, alcohol, spicy foods, foods high in acid and drinks with caffeine may cause irritation or frequency and should be used in moderation. To keep your urine flowing freely and avoid constipation, drink plenty of fluids during the day (8-10 glasses). Tip: Avoid cranberry juice because it is very acidic.  Activity:  Your physical activity doesn't need to be restricted. However, if you are very active, you may see some blood in the urine. We suggest that you reduce your activity under the circumstances until the bleeding has stopped.  Bowels:  It is important to keep your bowels regular during the postoperative period. Straining with bowel movements can cause bleeding. A bowel movement every other day is reasonable. Use a mild laxative if needed, such as milk of magnesia 2-3 tablespoons, or 2 Dulcolax tablets. Call if you continue to have  problems. If you had been taking narcotics for pain, before, during or after your surgery, you may be constipated. Take a laxative if necessary.    Medication:  You should resume your pre-surgery medications unless told not to. In addition you may be given an antibiotic to prevent or treat infection. Antibiotics are not always necessary. All medication should be taken as prescribed until the bottles are finished unless you are having an unusual reaction to one of the drugs.      Post Anesthesia Home Care Instructions  Activity: Get plenty of rest for the remainder of the day. A responsible adult should stay with you for 24 hours following the procedure.  For the next 24 hours, DO NOT: -Drive a car -Paediatric nurse -Drink alcoholic beverages -Take any medication unless instructed by your physician -Make any legal decisions or sign important papers.  Meals: Start with liquid foods such as gelatin or soup. Progress to regular foods as tolerated. Avoid greasy, spicy, heavy foods. If nausea and/or vomiting occur, drink only clear liquids until the nausea and/or vomiting subsides. Call your physician if vomiting continues.  Special Instructions/Symptoms: Your throat may feel dry or sore from the anesthesia or the breathing tube placed in your throat during surgery. If this causes discomfort, gargle with warm salt water. The discomfort should disappear within 24 hours.  If you had a scopolamine patch placed behind your ear for the management of post- operative nausea and/or vomiting:  1. The medication in the patch is effective for 72 hours, after which it should be  removed.  Wrap patch in a tissue and discard in the trash. Wash hands thoroughly with soap and water. 2. You may remove the patch earlier than 72 hours if you experience unpleasant side effects which may include dry mouth, dizziness or visual disturbances. 3. Avoid touching the patch. Wash your hands with soap and water after  contact with the patch.

## 2015-04-14 NOTE — H&P (Deleted)
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 She now has intermittent A. fib. She is on xarelte oh. She presented to our clinic several years ago for gross hematuria. She then re-presented with intermittent flank pain and ongoing gross hematuria. A repeat CT scan demonstrated a 10 mm stone at the right UPJ. The patient presents today for discussion of further management.  The patient describes intermittent severe right flank pain. She's also had gross hematuria. She denies any fevers or chills. She denies any progressive voiding symptoms. She denies any weight loss.   Current Meds 1. B-12 TABS;  Therapy: (Recorded:23Dec2014) to Recorded 2. Cinnamon CAPS;  Therapy: (Recorded:23Dec2014) to Recorded 3. DiltiaZEM HCl ER Coated Beads 120 MG Oral Capsule Extended Release 24 Hour;  Therapy: [phone] to Recorded 4. MetFORMIN HCl - 500 MG Oral Tablet;  Therapy: 27Feb2014 to Recorded 5. Metoprolol Succinate ER 50 MG Oral Tablet Extended Release 24 Hour;  Therapy: [id number] to Recorded 6. Multi-Vitamin TABS;  Therapy: (Recorded:23Dec2014) to Recorded 7. Xarelto 20 MG Oral Tablet;  Therapy: [id number] to Recorded  Allergies Medication  1. Codeine Derivatives  Social History Problems  1. Never smoker  Vitals Vital Signs [Data Includes: Last 1 Day]  Recorded: [id number] 03:32PM  Height: 5 ft 1.5 in Weight: 263 lb  BMI Calculated: 48.89 BSA Calculated: 2.13 Blood Pressure: 113 / 62 Heart Rate: 89  Physical Exam Constitutional: Well nourished and well developed . No acute distress.  ENT:. The ears and nose are normal in appearance.  Neck: The appearance of the neck is normal and no neck mass is present.  Pulmonary: No respiratory distress and normal respiratory rhythm and effort.  Cardiovascular: Heart rate and rhythm are normal . No peripheral  edema. A grade /6 systolic murmur was heard.  Abdomen: The abdomen is obese. The abdomen is soft and nontender. No masses are palpated. No CVA tenderness. No hernias are palpable. No hepatosplenomegaly noted.    Results/Data Urine [Data Includes: Last 1 Day]   [id number]  COLOR BROWN   APPEARANCE TURBID   SPECIFIC GRAVITY    pH TNP   GLUCOSE TNP   BILIRUBIN TNP   KETONE TNP   BLOOD TNP   PROTEIN TNP   NITRITE TNP   LEUKOCYTE ESTERASE TNP   SQUAMOUS EPITHELIAL/HPF NONE SEEN HPF  WBC 0-5 WBC/HPF  RBC >60 RBC/HPF  BACTERIA  HPF  CRYSTALS See Below HPF  CASTS NONE SEEN LPF  Yeast NONE SEEN HPF   I've independently reviewed the patient's CT scan which demonstrates a right-sided 10 mm stone in the UPJ. She also has an additional stone in the left kidney.   Assessment Assessed  1. Gross hematuria (R31.0) 2. Nephrolithiasis (N20.0)  Plan Health Maintenance  1. UA With REFLEX; [Do Not Release]; Status:Resulted - Requires Verification;   [id number] 03:21PM  Discussion/Summary The patient has an intermittently obstructing right UPJ stone. This is likely also the cause of her gross hematuria. The patient would like this stone treated given the nagging and persistence of symptoms that she has been experiencing. Carbocaine factors that she is on xarelte toe. We discussed the treatment options for her which would include shockwave lithotripsy and ureteroscopy. The patient would like to go with ureteroscopy given the higher chance of being stone free. Further, the patient would not need to be off of anticoagulation quite as long. I requested  that the patient be off of anticoagulation for the surgery, she tells me that she often bridges with Lovenox. She understands that following the operation she may experience some bleeding, especially if the anticoagulant is restarted to early. We will plan to leave the stent behind with the stent tether. The patient will then be scheduled for follow-up  in 6 weeks with a renal ultrasound now.

## 2015-04-17 ENCOUNTER — Encounter (HOSPITAL_BASED_OUTPATIENT_CLINIC_OR_DEPARTMENT_OTHER): Payer: Self-pay | Admitting: Urology

## 2015-04-17 NOTE — Anesthesia Postprocedure Evaluation (Signed)
Anesthesia Post Note  Patient: Stephen Collins  Procedure(s) Performed: Procedure(s) (LRB): CYSTOSCOPY WITH LITHOLAPAXY (N/A)  Patient location during evaluation: PACU Anesthesia Type: General Level of consciousness: awake and alert and patient cooperative Pain management: pain level controlled Vital Signs Assessment: post-procedure vital signs reviewed and stable Respiratory status: spontaneous breathing and respiratory function stable Cardiovascular status: stable Anesthetic complications: no    Last Vitals:  Filed Vitals:   04/14/15 1030 04/14/15 1200  BP: 127/66 113/68  Pulse: 78 76  Temp:  36.4 C  Resp: 12 14    Last Pain: There were no vitals filed for this visit.               [place]

## 2015-04-28 DIAGNOSIS — D485 Neoplasm of uncertain behavior of skin: Secondary | ICD-10-CM | POA: Diagnosis not present

## 2015-04-28 DIAGNOSIS — L821 Other seborrheic keratosis: Secondary | ICD-10-CM | POA: Diagnosis not present

## 2015-04-28 DIAGNOSIS — L738 Other specified follicular disorders: Secondary | ICD-10-CM | POA: Diagnosis not present

## 2015-04-28 DIAGNOSIS — D2261 Melanocytic nevi of right upper limb, including shoulder: Secondary | ICD-10-CM | POA: Diagnosis not present

## 2015-05-29 DIAGNOSIS — N210 Calculus in bladder: Secondary | ICD-10-CM | POA: Diagnosis not present

## 2015-09-14 DIAGNOSIS — G4733 Obstructive sleep apnea (adult) (pediatric): Secondary | ICD-10-CM | POA: Diagnosis not present

## 2015-12-01 DIAGNOSIS — Z125 Encounter for screening for malignant neoplasm of prostate: Secondary | ICD-10-CM | POA: Diagnosis not present

## 2015-12-01 DIAGNOSIS — I10 Essential (primary) hypertension: Secondary | ICD-10-CM | POA: Diagnosis not present

## 2015-12-01 DIAGNOSIS — E784 Other hyperlipidemia: Secondary | ICD-10-CM | POA: Diagnosis not present

## 2015-12-01 DIAGNOSIS — E119 Type 2 diabetes mellitus without complications: Secondary | ICD-10-CM | POA: Diagnosis not present

## 2015-12-14 DIAGNOSIS — F39 Unspecified mood [affective] disorder: Secondary | ICD-10-CM | POA: Diagnosis not present

## 2015-12-14 DIAGNOSIS — Z6834 Body mass index (BMI) 34.0-34.9, adult: Secondary | ICD-10-CM | POA: Diagnosis not present

## 2015-12-14 DIAGNOSIS — I10 Essential (primary) hypertension: Secondary | ICD-10-CM | POA: Diagnosis not present

## 2015-12-14 DIAGNOSIS — Z1389 Encounter for screening for other disorder: Secondary | ICD-10-CM | POA: Diagnosis not present

## 2015-12-14 DIAGNOSIS — G4733 Obstructive sleep apnea (adult) (pediatric): Secondary | ICD-10-CM | POA: Diagnosis not present

## 2015-12-14 DIAGNOSIS — Z0000 Encounter for general adult medical examination without abnormal findings: Secondary | ICD-10-CM | POA: Diagnosis not present

## 2015-12-14 DIAGNOSIS — E784 Other hyperlipidemia: Secondary | ICD-10-CM | POA: Diagnosis not present

## 2015-12-14 DIAGNOSIS — Z23 Encounter for immunization: Secondary | ICD-10-CM | POA: Diagnosis not present

## 2015-12-14 DIAGNOSIS — N210 Calculus in bladder: Secondary | ICD-10-CM | POA: Diagnosis not present

## 2015-12-14 DIAGNOSIS — K219 Gastro-esophageal reflux disease without esophagitis: Secondary | ICD-10-CM | POA: Diagnosis not present

## 2015-12-14 DIAGNOSIS — E668 Other obesity: Secondary | ICD-10-CM | POA: Diagnosis not present

## 2015-12-14 DIAGNOSIS — E119 Type 2 diabetes mellitus without complications: Secondary | ICD-10-CM | POA: Diagnosis not present

## 2015-12-15 DIAGNOSIS — Z1212 Encounter for screening for malignant neoplasm of rectum: Secondary | ICD-10-CM | POA: Diagnosis not present

## 2016-01-11 DIAGNOSIS — H524 Presbyopia: Secondary | ICD-10-CM | POA: Diagnosis not present

## 2016-02-23 DIAGNOSIS — G4733 Obstructive sleep apnea (adult) (pediatric): Secondary | ICD-10-CM | POA: Diagnosis not present

## 2016-04-01 DIAGNOSIS — E669 Obesity, unspecified: Secondary | ICD-10-CM | POA: Diagnosis not present

## 2016-04-01 DIAGNOSIS — F17200 Nicotine dependence, unspecified, uncomplicated: Secondary | ICD-10-CM | POA: Diagnosis not present

## 2016-04-01 DIAGNOSIS — G4733 Obstructive sleep apnea (adult) (pediatric): Secondary | ICD-10-CM | POA: Diagnosis not present

## 2016-04-01 DIAGNOSIS — J309 Allergic rhinitis, unspecified: Secondary | ICD-10-CM | POA: Diagnosis not present

## 2016-04-01 DIAGNOSIS — E119 Type 2 diabetes mellitus without complications: Secondary | ICD-10-CM | POA: Diagnosis not present

## 2016-04-01 DIAGNOSIS — I10 Essential (primary) hypertension: Secondary | ICD-10-CM | POA: Diagnosis not present

## 2016-04-01 DIAGNOSIS — Z1389 Encounter for screening for other disorder: Secondary | ICD-10-CM | POA: Diagnosis not present

## 2016-04-01 DIAGNOSIS — Z6834 Body mass index (BMI) 34.0-34.9, adult: Secondary | ICD-10-CM | POA: Diagnosis not present

## 2016-04-01 DIAGNOSIS — F39 Unspecified mood [affective] disorder: Secondary | ICD-10-CM | POA: Diagnosis not present

## 2016-08-19 DIAGNOSIS — F17200 Nicotine dependence, unspecified, uncomplicated: Secondary | ICD-10-CM | POA: Diagnosis not present

## 2016-08-19 DIAGNOSIS — E669 Obesity, unspecified: Secondary | ICD-10-CM | POA: Diagnosis not present

## 2016-08-19 DIAGNOSIS — F39 Unspecified mood [affective] disorder: Secondary | ICD-10-CM | POA: Diagnosis not present

## 2016-08-19 DIAGNOSIS — I10 Essential (primary) hypertension: Secondary | ICD-10-CM | POA: Diagnosis not present

## 2016-08-19 DIAGNOSIS — G4733 Obstructive sleep apnea (adult) (pediatric): Secondary | ICD-10-CM | POA: Diagnosis not present

## 2016-08-19 DIAGNOSIS — E119 Type 2 diabetes mellitus without complications: Secondary | ICD-10-CM | POA: Diagnosis not present

## 2016-08-19 DIAGNOSIS — Z6834 Body mass index (BMI) 34.0-34.9, adult: Secondary | ICD-10-CM | POA: Diagnosis not present

## 2016-12-03 DIAGNOSIS — G4733 Obstructive sleep apnea (adult) (pediatric): Secondary | ICD-10-CM | POA: Diagnosis not present

## 2016-12-26 DIAGNOSIS — Z125 Encounter for screening for malignant neoplasm of prostate: Secondary | ICD-10-CM | POA: Diagnosis not present

## 2016-12-26 DIAGNOSIS — E119 Type 2 diabetes mellitus without complications: Secondary | ICD-10-CM | POA: Diagnosis not present

## 2016-12-26 DIAGNOSIS — Z0000 Encounter for general adult medical examination without abnormal findings: Secondary | ICD-10-CM | POA: Diagnosis not present

## 2016-12-26 DIAGNOSIS — I10 Essential (primary) hypertension: Secondary | ICD-10-CM | POA: Diagnosis not present

## 2017-01-09 DIAGNOSIS — H524 Presbyopia: Secondary | ICD-10-CM | POA: Diagnosis not present

## 2017-01-10 DIAGNOSIS — N210 Calculus in bladder: Secondary | ICD-10-CM | POA: Diagnosis not present

## 2017-01-10 DIAGNOSIS — F17200 Nicotine dependence, unspecified, uncomplicated: Secondary | ICD-10-CM | POA: Diagnosis not present

## 2017-01-10 DIAGNOSIS — E668 Other obesity: Secondary | ICD-10-CM | POA: Diagnosis not present

## 2017-01-10 DIAGNOSIS — Z0000 Encounter for general adult medical examination without abnormal findings: Secondary | ICD-10-CM | POA: Diagnosis not present

## 2017-01-10 DIAGNOSIS — F39 Unspecified mood [affective] disorder: Secondary | ICD-10-CM | POA: Diagnosis not present

## 2017-01-10 DIAGNOSIS — Z1389 Encounter for screening for other disorder: Secondary | ICD-10-CM | POA: Diagnosis not present

## 2017-01-10 DIAGNOSIS — Z1212 Encounter for screening for malignant neoplasm of rectum: Secondary | ICD-10-CM | POA: Diagnosis not present

## 2017-01-10 DIAGNOSIS — G4733 Obstructive sleep apnea (adult) (pediatric): Secondary | ICD-10-CM | POA: Diagnosis not present

## 2017-01-10 DIAGNOSIS — K219 Gastro-esophageal reflux disease without esophagitis: Secondary | ICD-10-CM | POA: Diagnosis not present

## 2017-01-10 DIAGNOSIS — E119 Type 2 diabetes mellitus without complications: Secondary | ICD-10-CM | POA: Diagnosis not present

## 2017-01-10 DIAGNOSIS — I10 Essential (primary) hypertension: Secondary | ICD-10-CM | POA: Diagnosis not present

## 2017-01-10 DIAGNOSIS — Z23 Encounter for immunization: Secondary | ICD-10-CM | POA: Diagnosis not present

## 2017-01-10 DIAGNOSIS — Z6836 Body mass index (BMI) 36.0-36.9, adult: Secondary | ICD-10-CM | POA: Diagnosis not present

## 2017-01-10 DIAGNOSIS — E7849 Other hyperlipidemia: Secondary | ICD-10-CM | POA: Diagnosis not present

## 2017-01-22 DIAGNOSIS — Z1211 Encounter for screening for malignant neoplasm of colon: Secondary | ICD-10-CM | POA: Diagnosis not present

## 2017-01-22 DIAGNOSIS — Z1212 Encounter for screening for malignant neoplasm of rectum: Secondary | ICD-10-CM | POA: Diagnosis not present

## 2017-02-20 DIAGNOSIS — R195 Other fecal abnormalities: Secondary | ICD-10-CM | POA: Diagnosis not present

## 2017-02-20 DIAGNOSIS — E119 Type 2 diabetes mellitus without complications: Secondary | ICD-10-CM | POA: Diagnosis not present

## 2017-02-20 DIAGNOSIS — G4733 Obstructive sleep apnea (adult) (pediatric): Secondary | ICD-10-CM | POA: Diagnosis not present

## 2017-02-20 DIAGNOSIS — F320 Major depressive disorder, single episode, mild: Secondary | ICD-10-CM | POA: Diagnosis not present

## 2017-02-26 DIAGNOSIS — R195 Other fecal abnormalities: Secondary | ICD-10-CM | POA: Diagnosis not present

## 2017-02-26 DIAGNOSIS — K635 Polyp of colon: Secondary | ICD-10-CM | POA: Diagnosis not present

## 2017-02-26 DIAGNOSIS — D126 Benign neoplasm of colon, unspecified: Secondary | ICD-10-CM | POA: Diagnosis not present

## 2017-02-26 DIAGNOSIS — K621 Rectal polyp: Secondary | ICD-10-CM | POA: Diagnosis not present

## 2017-03-04 DIAGNOSIS — K635 Polyp of colon: Secondary | ICD-10-CM | POA: Diagnosis not present

## 2017-03-04 DIAGNOSIS — D126 Benign neoplasm of colon, unspecified: Secondary | ICD-10-CM | POA: Diagnosis not present

## 2017-04-07 DIAGNOSIS — Z6836 Body mass index (BMI) 36.0-36.9, adult: Secondary | ICD-10-CM | POA: Diagnosis not present

## 2017-04-07 DIAGNOSIS — I10 Essential (primary) hypertension: Secondary | ICD-10-CM | POA: Diagnosis not present

## 2017-04-16 DIAGNOSIS — I10 Essential (primary) hypertension: Secondary | ICD-10-CM | POA: Diagnosis not present

## 2017-04-21 DIAGNOSIS — I10 Essential (primary) hypertension: Secondary | ICD-10-CM | POA: Diagnosis not present

## 2017-05-19 DIAGNOSIS — R8290 Unspecified abnormal findings in urine: Secondary | ICD-10-CM | POA: Diagnosis not present

## 2017-05-19 DIAGNOSIS — N390 Urinary tract infection, site not specified: Secondary | ICD-10-CM | POA: Diagnosis not present

## 2017-05-19 DIAGNOSIS — J09X2 Influenza due to identified novel influenza A virus with other respiratory manifestations: Secondary | ICD-10-CM | POA: Diagnosis not present

## 2017-05-19 DIAGNOSIS — H6123 Impacted cerumen, bilateral: Secondary | ICD-10-CM | POA: Diagnosis not present

## 2017-05-19 DIAGNOSIS — J111 Influenza due to unidentified influenza virus with other respiratory manifestations: Secondary | ICD-10-CM | POA: Diagnosis not present

## 2017-05-19 DIAGNOSIS — Z6834 Body mass index (BMI) 34.0-34.9, adult: Secondary | ICD-10-CM | POA: Diagnosis not present

## 2017-05-22 DIAGNOSIS — F17200 Nicotine dependence, unspecified, uncomplicated: Secondary | ICD-10-CM | POA: Diagnosis not present

## 2017-05-22 DIAGNOSIS — I10 Essential (primary) hypertension: Secondary | ICD-10-CM | POA: Diagnosis not present

## 2017-05-22 DIAGNOSIS — F39 Unspecified mood [affective] disorder: Secondary | ICD-10-CM | POA: Diagnosis not present

## 2017-05-22 DIAGNOSIS — E119 Type 2 diabetes mellitus without complications: Secondary | ICD-10-CM | POA: Diagnosis not present

## 2017-05-22 DIAGNOSIS — J111 Influenza due to unidentified influenza virus with other respiratory manifestations: Secondary | ICD-10-CM | POA: Diagnosis not present

## 2017-05-22 DIAGNOSIS — Z6834 Body mass index (BMI) 34.0-34.9, adult: Secondary | ICD-10-CM | POA: Diagnosis not present

## 2017-05-22 DIAGNOSIS — E668 Other obesity: Secondary | ICD-10-CM | POA: Diagnosis not present

## 2017-08-20 DIAGNOSIS — E119 Type 2 diabetes mellitus without complications: Secondary | ICD-10-CM | POA: Diagnosis not present

## 2017-08-20 DIAGNOSIS — K621 Rectal polyp: Secondary | ICD-10-CM | POA: Diagnosis not present

## 2017-08-20 DIAGNOSIS — K5900 Constipation, unspecified: Secondary | ICD-10-CM | POA: Diagnosis not present

## 2017-08-20 DIAGNOSIS — G4733 Obstructive sleep apnea (adult) (pediatric): Secondary | ICD-10-CM | POA: Diagnosis not present

## 2017-08-21 ENCOUNTER — Other Ambulatory Visit: Payer: Self-pay | Admitting: Gastroenterology

## 2017-09-22 ENCOUNTER — Ambulatory Visit (HOSPITAL_COMMUNITY)
Admission: RE | Admit: 2017-09-22 | Discharge: 2017-09-22 | Disposition: A | Discharge status: Home / Self Care | Payer: PPO | Source: Ambulatory Visit | Attending: Gastroenterology | Admitting: Gastroenterology

## 2017-09-22 ENCOUNTER — Ambulatory Visit (HOSPITAL_COMMUNITY): Payer: PPO | Admitting: Certified Registered Nurse Anesthetist

## 2017-09-22 ENCOUNTER — Other Ambulatory Visit: Payer: Self-pay

## 2017-09-22 ENCOUNTER — Encounter (HOSPITAL_COMMUNITY): Payer: Self-pay | Admitting: *Deleted

## 2017-09-22 ENCOUNTER — Encounter (HOSPITAL_COMMUNITY)
Admission: RE | Disposition: A | Discharge status: Home / Self Care | Payer: Self-pay | Source: Ambulatory Visit | Attending: Gastroenterology

## 2017-09-22 DIAGNOSIS — D125 Benign neoplasm of sigmoid colon: Secondary | ICD-10-CM | POA: Diagnosis not present

## 2017-09-22 DIAGNOSIS — F17210 Nicotine dependence, cigarettes, uncomplicated: Secondary | ICD-10-CM | POA: Insufficient documentation

## 2017-09-22 DIAGNOSIS — K640 First degree hemorrhoids: Secondary | ICD-10-CM | POA: Diagnosis not present

## 2017-09-22 DIAGNOSIS — K635 Polyp of colon: Secondary | ICD-10-CM | POA: Diagnosis not present

## 2017-09-22 DIAGNOSIS — Z86010 Personal history of colonic polyps: Secondary | ICD-10-CM | POA: Diagnosis not present

## 2017-09-22 DIAGNOSIS — E785 Hyperlipidemia, unspecified: Secondary | ICD-10-CM | POA: Insufficient documentation

## 2017-09-22 DIAGNOSIS — I10 Essential (primary) hypertension: Secondary | ICD-10-CM | POA: Insufficient documentation

## 2017-09-22 DIAGNOSIS — K621 Rectal polyp: Secondary | ICD-10-CM | POA: Diagnosis not present

## 2017-09-22 DIAGNOSIS — Z79899 Other long term (current) drug therapy: Secondary | ICD-10-CM | POA: Insufficient documentation

## 2017-09-22 DIAGNOSIS — G4733 Obstructive sleep apnea (adult) (pediatric): Secondary | ICD-10-CM | POA: Insufficient documentation

## 2017-09-22 DIAGNOSIS — F329 Major depressive disorder, single episode, unspecified: Secondary | ICD-10-CM | POA: Diagnosis not present

## 2017-09-22 HISTORY — PX: FLEXIBLE SIGMOIDOSCOPY: SHX5431

## 2017-09-22 HISTORY — PX: SUBMUCOSAL INJECTION: SHX5543

## 2017-09-22 HISTORY — PX: POLYPECTOMY: SHX5525

## 2017-09-22 SURGERY — SIGMOIDOSCOPY, FLEXIBLE
Anesthesia: Monitor Anesthesia Care

## 2017-09-22 MED ORDER — PROPOFOL 10 MG/ML IV BOLUS
INTRAVENOUS | Status: AC
  Filled 2017-09-22: qty 40

## 2017-09-22 MED ORDER — PROPOFOL 500 MG/50ML IV EMUL
INTRAVENOUS | Status: DC | PRN
  Administered 2017-09-22: 150 ug/kg/min via INTRAVENOUS

## 2017-09-22 MED ORDER — SODIUM CHLORIDE 0.9 % IV SOLN: INTRAVENOUS | Status: DC

## 2017-09-22 MED ORDER — PROPOFOL 500 MG/50ML IV EMUL
INTRAVENOUS | Status: DC | PRN
  Administered 2017-09-22: 30 mg via INTRAVENOUS

## 2017-09-22 MED ORDER — LACTATED RINGERS IV SOLN
INTRAVENOUS | Status: DC
  Administered 2017-09-22: 11:00:00 via INTRAVENOUS

## 2017-09-22 NOTE — Anesthesia Postprocedure Evaluation (Signed)
Anesthesia Post Note  Patient: Stephen Collins  Procedure(s) Performed: FLEXIBLE SIGMOIDOSCOPY (N/A ) HOT HEMOSTASIS (ARGON PLASMA COAGULATION/BICAP) (N/A ) POLYPECTOMY     Patient location during evaluation: Endoscopy Anesthesia Type: MAC Level of consciousness: sedated Pain management: pain level controlled Vital Signs Assessment: post-procedure vital signs reviewed and stable Respiratory status: spontaneous breathing Cardiovascular status: stable Postop Assessment: no apparent nausea or vomiting Anesthetic complications: no    Last Vitals:  Vitals:   09/22/17 1146 09/22/17 1150  BP: 117/71 106/62  Pulse: 75 77  Resp: 13 18  Temp: 36.5 C   SpO2: 99% 96%    Last Pain:  Vitals:   09/22/17 1150  TempSrc:   PainSc: 0-No pain   Pain Goal:                 Juddson Cobern JR,JOHN Chezney Huether

## 2017-09-22 NOTE — Anesthesia Preprocedure Evaluation (Addendum)
Anesthesia Evaluation  Patient identified by MRN, date of birth, ID band Patient awake    Reviewed: Allergy & Precautions, NPO status , Patient's Chart, lab work & pertinent test results  Airway Mallampati: II   Neck ROM: full    Dental  (+) Poor Dentition,    Pulmonary sleep apnea and Continuous Positive Airway Pressure Ventilation , Current Smoker,    breath sounds clear to auscultation       Cardiovascular hypertension, Pt. on medications Normal cardiovascular exam Rhythm:regular Rate:Normal     Neuro/Psych PSYCHIATRIC DISORDERS Depression    GI/Hepatic negative GI ROS, Neg liver ROS,   Endo/Other  obese  Renal/GU Kidney stones   Bladder stones    Musculoskeletal negative musculoskeletal ROS (+)   Abdominal (+) + obese,   Peds  Hematology negative hematology ROS (+)   Anesthesia Other Findings   Reproductive/Obstetrics                            Anesthesia Physical  Anesthesia Plan  ASA: II  Anesthesia Plan: MAC   Post-op Pain Management:    Induction:   PONV Risk Score and Plan: 0  Airway Management Planned: Natural Airway, Nasal Cannula and Simple Face Mask  Additional Equipment:   Intra-op Plan:   Post-operative Plan:   Informed Consent: I have reviewed the patients History and Physical, chart, labs and discussed the procedure including the risks, benefits and alternatives for the proposed anesthesia with the patient or authorized representative who has indicated his/her understanding and acceptance.     Plan Discussed with: CRNA  Anesthesia Plan Comments:         Anesthesia Quick Evaluation

## 2017-09-22 NOTE — H&P (Signed)
Subjective:   Patient is a 72 y.o. male presents with need for flexible sigmoidoscopy possibly APC coagulation of rectal polyp. Patient had colonoscopy 6 months ago with several polyps removed. In the rectum he had a 2 cm villous adenoma removed with piecemeal technique. We injected the area with spot for tattoo. Pathology on the polyp revealed villous adenoma with high-grade dysplasia. This procedure is done to document complete removal of the polyp and to fulgerated any residual polyp with the APC.Marland Kitchen Procedure including risks and benefits discussed in office.  There are no active problems to display for this patient.  Past Medical History:  Diagnosis Date  . Bilateral renal cysts   . Bladder stones   . Depression   . Frequency of urination   . History of kidney stones   . Hyperlipidemia   . Hypertension   . OSA on CPAP   . Wears glasses     Past Surgical History:  Procedure Laterality Date  . COLONOSCOPY  2006  . CYSTOSCOPY WITH LITHOLAPAXY N/A 04/14/2015   Procedure: CYSTOSCOPY WITH LITHOLAPAXY;  Surgeon: Ardis Hughs, MD;  Location: [facility];  Service: Urology;  Laterality: N/A;  . TONSILLECTOMY  as child    Medications Prior to Admission  Medication Sig Dispense Refill Last Dose  . amLODipine (NORVASC) 10 MG tablet Take 10 mg by mouth every morning.   09/22/2017 at Unknown time  . atorvastatin (LIPITOR) 20 MG tablet Take 20 mg by mouth every morning.   09/22/2017 at Unknown time  . buPROPion (WELLBUTRIN XL) 300 MG 24 hr tablet Take 300 mg by mouth every morning.   09/22/2017 at Unknown time  . irbesartan-hydrochlorothiazide (AVALIDE) 300-12.5 MG tablet Take 1 tablet by mouth every morning.    09/22/2017 at Unknown time  . Multiple Vitamin (MULTIVITAMIN) tablet Take 1 tablet by mouth daily.   09/22/2017 at Unknown time  . phenazopyridine (PYRIDIUM) 200 MG tablet Take 1 tablet (200 mg total) by mouth 3 (three) times daily as needed for pain. (Patient not taking:  Reported on 09/15/2017) 10 tablet 0 Not Taking at Unknown time  . traMADol (ULTRAM) 50 MG tablet Take 1-2 tablets (50-100 mg total) by mouth every 6 (six) hours as needed for moderate pain. (Patient not taking: Reported on 09/15/2017) 10 tablet 0 Not Taking at Unknown time   No Known Allergies  Social History   Tobacco Use  . Smoking status: Current Every Day Smoker    Packs/day: 0.50    Years: 48.00    Pack years: 24.00    Types: Cigarettes  . Smokeless tobacco: Never Used  Substance Use Topics  . Alcohol use: Yes    Alcohol/week: 12.0 standard drinks    Types: 12 Glasses of wine per week    Comment: 1-2 wine daily    History reviewed. No pertinent family history.   Objective:   Patient Vitals for the past 8 hrs:  BP Temp Temp src Pulse Resp SpO2 Height Weight  09/22/17 1035 (!) 141/80 98.2 F (36.8 C) Oral 88 16 99 % 5\' 8"  (1.727 m) 103 kg   No intake/output data recorded. No intake/output data recorded.   See MD Preop evaluation      Assessment:   1. History of rectal polyp with high-grade dysplasia  Plan:   Will proceed with flexible sigmoidoscopy. Will use sedation in case APC needed. The patient has been prep.

## 2017-09-22 NOTE — Discharge Instructions (Signed)
YOU HAD AN ENDOSCOPIC PROCEDURE TODAY: Refer to the procedure report and other information in the discharge instructions given to you for any specific questions about what was found during the examination. If this information does not answer your questions, please call [facility] office at [phone] to clarify.   YOU SHOULD EXPECT: Some feelings of bloating in the abdomen. Passage of more gas than usual. Walking can help get rid of the air that was put into your GI tract during the procedure and reduce the bloating. If you had a lower endoscopy (such as a colonoscopy or flexible sigmoidoscopy) you may notice spotting of blood in your stool or on the toilet paper. Some abdominal soreness may be present for a day or two, also.  DIET: Your first meal following the procedure should be a light meal and then it is ok to progress to your normal diet. A half-sandwich or bowl of soup is an example of a good first meal. Heavy or fried foods are harder to digest and may make you feel nauseous or bloated. Drink plenty of fluids but you should avoid alcoholic beverages for 24 hours. If you had a esophageal dilation, please see attached instructions for diet.   ACTIVITY: Your care partner should take you home directly after the procedure. You should plan to take it easy, moving slowly for the rest of the day. You can resume normal activity the day after the procedure however YOU SHOULD NOT DRIVE, use power tools, machinery or perform tasks that involve climbing or major physical exertion for 24 hours (because of the sedation medicines used during the test).   SYMPTOMS TO REPORT IMMEDIATELY: A gastroenterologist can be reached at any hour. Please call [phone]  for any of the following symptoms:  Following lower endoscopy (colonoscopy, flexible sigmoidoscopy) Excessive amounts of blood in the stool  Significant tenderness, worsening of abdominal pains  Swelling of the abdomen that is new, acute  Fever of 100 or  higher  Following upper endoscopy (EGD, EUS, ERCP, esophageal dilation) Vomiting of blood or coffee ground material  New, significant abdominal pain  New, significant chest pain or pain under the shoulder blades  Painful or persistently difficult swallowing  New shortness of breath  Black, tarry-looking or red, bloody stools  FOLLOW UP:  If any biopsies were taken you will be contacted by phone or by letter within the next 1-3 weeks. Call [phone]  if you have not heard about the biopsies in 3 weeks.  Please also call with any specific questions about appointments or follow up tests.  No aspirin, ibuprofen, Aleve for other medications in this category for 5 days. Tylenol is okay

## 2017-09-22 NOTE — Transfer of Care (Signed)
Immediate Anesthesia Transfer of Care Note  Patient: Stephen Collins  Procedure(s) Performed: FLEXIBLE SIGMOIDOSCOPY (N/A ) HOT HEMOSTASIS (ARGON PLASMA COAGULATION/BICAP) (N/A ) POLYPECTOMY  Patient Location: PACU  Anesthesia Type:MAC  Level of Consciousness: sedated, patient cooperative and responds to stimulation  Airway & Oxygen Therapy: Patient Spontanous Breathing and Patient connected to face mask oxygen  Post-op Assessment: Report given to RN and Post -op Vital signs reviewed and stable  Post vital signs: Reviewed and stable  Last Vitals:  Vitals Value Taken Time  BP    Temp    Pulse 72 09/22/2017 11:44 AM  Resp 13 09/22/2017 11:44 AM  SpO2 97 % 09/22/2017 11:44 AM  Vitals shown include unvalidated device data.  Last Pain:  Vitals:   09/22/17 1035  TempSrc: Oral  PainSc: 0-No pain         Complications: No apparent anesthesia complications

## 2017-09-22 NOTE — Op Note (Signed)
[facility] Patient Name: Stephen Collins Procedure Date: 09/22/2017 MRN: [id number] Attending MD: Nancy Fetter Dr., MD Date of Birth: December 25, 1945 CSN: [id number] Age: 72 Admit Type: Outpatient Procedure:                Flexible Sigmoidoscopy Indications:              High risk colon cancer surveillance: Personal                            history of colonic polyps. Patient had colonoscopy                            6 months ago with a large polyp removed from the                            rectum. This was tattooed. The pathology showed                            high-grade dysplasia. It was felt it was likely                            completely remove that we wanted to come back in 6                            months and make certain. Providers:                Joyice Faster. Thanos Cousineau Dr., MD, Zenon Mayo, RN,                            William Dalton, Technician Referring MD:             [name] MD Medicines:                Monitored Anesthesia Care Complications:            No immediate complications. Estimated Blood Loss:     Estimated blood loss was minimal. Procedure:                Pre-Anesthesia Assessment:                           - Prior to the procedure, a History and Physical                            was performed, and patient medications and                            allergies were reviewed. The patient's tolerance of                            previous anesthesia was also reviewed. The risks                            and benefits of the procedure and the sedation  options and risks were discussed with the patient.                            All questions were answered, and informed consent                            was obtained. Prior Anticoagulants: The patient has                            taken no previous anticoagulant or antiplatelet                            agents. ASA Grade Assessment: II - A patient with                           mild systemic disease. After reviewing the risks                            and benefits, the patient was deemed in                            satisfactory condition to undergo the procedure.                           After obtaining informed consent, the scope was                            passed under direct vision. The [id number]-[id number]                            ([id number] Olympus peds colonoscope was introduced                            through the anus and advanced to the the splenic                            flexure. The flexible sigmoidoscopy was                            accomplished without difficulty. The patient                            tolerated the procedure well. The quality of the                            bowel preparation was good. Scope In: 11:22:11 AM Scope Out: 11:39:54 AM Total Procedure Duration: 0 hours 17 minutes 43 seconds  Findings:      The sigmoid colon and descending colon appeared normal.      The perianal and digital rectal examinations were normal.      A 4 mm polyp was found in the rectum. The polyp was sessile. The polyp       was removed with a hot snare. Resection and retrieval were complete.       This polyp  was located in the tattooed area felt to be residual from the       original rectal polyp. The area around the tattooed appeared       endoscopically normal there did appear to be some scar tissue.      Two sessile polyps were found in the rectum. The polyps were 3 to 4 mm       in size. These polyps were removed with a hot snare. Resection and       retrieval were complete. These polyps were small and located somewhat       distal to the previously remove large polyp.      Non-bleeding internal hemorrhoids were found during retroflexion. The       hemorrhoids were large and Grade I (internal hemorrhoids that do not       prolapse). Was possible improvements anal polyps mixed in with the       hemorrhoids. Impression:                - The sigmoid colon and descending colon are normal.                           - One 4 mm polyp in the rectum, removed with a hot                            snare. Resected and retrieved.                           - Two 3 to 4 mm polyps in the rectum, removed with                            a hot snare. Resected and retrieved.                           - Non-bleeding internal hemorrhoids. Moderate Sedation:      See anesthesia note, no moderate sedation. Recommendation:           - Discharge patient to home.                           - Patient has a contact number available for                            emergencies. The signs and symptoms of potential                            delayed complications were discussed with the                            patient. Return to normal activities tomorrow.                            Written discharge instructions were provided to the                            patient.                           -  No aspirin, ibuprofen, naproxen, or other                            non-steroidal anti-inflammatory drugs for 5 days                            after polyp removal.                           - Repeat flexible sigmoidoscopy in 1 year for                            surveillance.                           - Resume previous diet.                           - Continue present medications. Procedure Code(s):        --- Professional ---                           [id number], Sigmoidoscopy, flexible; with removal of                            tumor(s), polyp(s), or other lesion(s) by snare                            technique Diagnosis Code(s):        --- Professional ---                           K62.1, Rectal polyp                           Z86.010, Personal history of colonic polyps                           K64.0, First degree hemorrhoids CPT copyright 2017 American Medical Association. All rights reserved. The codes documented in this report are  preliminary and upon coder review may  be revised to meet current compliance requirements. Nancy Fetter Dr., MD 09/22/2017 11:48:33 AM This report has been signed electronically. Number of Addenda: 0

## 2017-09-23 ENCOUNTER — Encounter (HOSPITAL_COMMUNITY): Payer: Self-pay | Admitting: Gastroenterology

## 2017-09-26 DIAGNOSIS — E668 Other obesity: Secondary | ICD-10-CM | POA: Diagnosis not present

## 2017-09-26 DIAGNOSIS — I10 Essential (primary) hypertension: Secondary | ICD-10-CM | POA: Diagnosis not present

## 2017-09-26 DIAGNOSIS — F17200 Nicotine dependence, unspecified, uncomplicated: Secondary | ICD-10-CM | POA: Diagnosis not present

## 2017-09-26 DIAGNOSIS — F39 Unspecified mood [affective] disorder: Secondary | ICD-10-CM | POA: Diagnosis not present

## 2017-09-26 DIAGNOSIS — Z6837 Body mass index (BMI) 37.0-37.9, adult: Secondary | ICD-10-CM | POA: Diagnosis not present

## 2017-09-26 DIAGNOSIS — K635 Polyp of colon: Secondary | ICD-10-CM | POA: Diagnosis not present

## 2017-09-26 DIAGNOSIS — E119 Type 2 diabetes mellitus without complications: Secondary | ICD-10-CM | POA: Diagnosis not present

## 2017-11-17 DIAGNOSIS — G4733 Obstructive sleep apnea (adult) (pediatric): Secondary | ICD-10-CM | POA: Diagnosis not present

## 2017-11-24 DIAGNOSIS — Z6835 Body mass index (BMI) 35.0-35.9, adult: Secondary | ICD-10-CM | POA: Diagnosis not present

## 2017-11-24 DIAGNOSIS — H6123 Impacted cerumen, bilateral: Secondary | ICD-10-CM | POA: Diagnosis not present

## 2017-11-24 DIAGNOSIS — J069 Acute upper respiratory infection, unspecified: Secondary | ICD-10-CM | POA: Diagnosis not present

## 2017-11-24 DIAGNOSIS — R05 Cough: Secondary | ICD-10-CM | POA: Diagnosis not present

## 2018-01-28 DIAGNOSIS — H40053 Ocular hypertension, bilateral: Secondary | ICD-10-CM | POA: Diagnosis not present

## 2018-02-06 DIAGNOSIS — E7849 Other hyperlipidemia: Secondary | ICD-10-CM | POA: Diagnosis not present

## 2018-02-06 DIAGNOSIS — R82998 Other abnormal findings in urine: Secondary | ICD-10-CM | POA: Diagnosis not present

## 2018-02-06 DIAGNOSIS — E119 Type 2 diabetes mellitus without complications: Secondary | ICD-10-CM | POA: Diagnosis not present

## 2018-02-06 DIAGNOSIS — Z125 Encounter for screening for malignant neoplasm of prostate: Secondary | ICD-10-CM | POA: Diagnosis not present

## 2018-02-06 DIAGNOSIS — I10 Essential (primary) hypertension: Secondary | ICD-10-CM | POA: Diagnosis not present

## 2018-02-12 DIAGNOSIS — G4733 Obstructive sleep apnea (adult) (pediatric): Secondary | ICD-10-CM | POA: Diagnosis not present

## 2018-02-12 DIAGNOSIS — E668 Other obesity: Secondary | ICD-10-CM | POA: Diagnosis not present

## 2018-02-12 DIAGNOSIS — K635 Polyp of colon: Secondary | ICD-10-CM | POA: Diagnosis not present

## 2018-02-12 DIAGNOSIS — E7849 Other hyperlipidemia: Secondary | ICD-10-CM | POA: Diagnosis not present

## 2018-02-12 DIAGNOSIS — Z6835 Body mass index (BMI) 35.0-35.9, adult: Secondary | ICD-10-CM | POA: Diagnosis not present

## 2018-02-12 DIAGNOSIS — F17200 Nicotine dependence, unspecified, uncomplicated: Secondary | ICD-10-CM | POA: Diagnosis not present

## 2018-02-12 DIAGNOSIS — F39 Unspecified mood [affective] disorder: Secondary | ICD-10-CM | POA: Diagnosis not present

## 2018-02-12 DIAGNOSIS — E1169 Type 2 diabetes mellitus with other specified complication: Secondary | ICD-10-CM | POA: Diagnosis not present

## 2018-02-12 DIAGNOSIS — I10 Essential (primary) hypertension: Secondary | ICD-10-CM | POA: Diagnosis not present

## 2018-02-12 DIAGNOSIS — Z23 Encounter for immunization: Secondary | ICD-10-CM | POA: Diagnosis not present

## 2018-02-12 DIAGNOSIS — Z0000 Encounter for general adult medical examination without abnormal findings: Secondary | ICD-10-CM | POA: Diagnosis not present

## 2018-02-12 DIAGNOSIS — R972 Elevated prostate specific antigen [PSA]: Secondary | ICD-10-CM | POA: Diagnosis not present

## 2018-02-19 DIAGNOSIS — Z1212 Encounter for screening for malignant neoplasm of rectum: Secondary | ICD-10-CM | POA: Diagnosis not present

## 2018-03-20 DIAGNOSIS — R3912 Poor urinary stream: Secondary | ICD-10-CM | POA: Diagnosis not present

## 2018-03-20 DIAGNOSIS — R972 Elevated prostate specific antigen [PSA]: Secondary | ICD-10-CM | POA: Diagnosis not present

## 2018-03-20 DIAGNOSIS — R300 Dysuria: Secondary | ICD-10-CM | POA: Diagnosis not present

## 2018-04-01 DIAGNOSIS — S46811A Strain of other muscles, fascia and tendons at shoulder and upper arm level, right arm, initial encounter: Secondary | ICD-10-CM | POA: Diagnosis not present

## 2018-04-01 DIAGNOSIS — I10 Essential (primary) hypertension: Secondary | ICD-10-CM | POA: Diagnosis not present

## 2018-04-01 DIAGNOSIS — F17200 Nicotine dependence, unspecified, uncomplicated: Secondary | ICD-10-CM | POA: Diagnosis not present

## 2018-04-01 DIAGNOSIS — Z6832 Body mass index (BMI) 32.0-32.9, adult: Secondary | ICD-10-CM | POA: Diagnosis not present

## 2018-04-01 DIAGNOSIS — E669 Obesity, unspecified: Secondary | ICD-10-CM | POA: Diagnosis not present

## 2018-06-11 DIAGNOSIS — G4733 Obstructive sleep apnea (adult) (pediatric): Secondary | ICD-10-CM | POA: Diagnosis not present

## 2018-06-15 DIAGNOSIS — E785 Hyperlipidemia, unspecified: Secondary | ICD-10-CM | POA: Diagnosis not present

## 2018-06-15 DIAGNOSIS — G4733 Obstructive sleep apnea (adult) (pediatric): Secondary | ICD-10-CM | POA: Diagnosis not present

## 2018-06-15 DIAGNOSIS — F39 Unspecified mood [affective] disorder: Secondary | ICD-10-CM | POA: Diagnosis not present

## 2018-06-15 DIAGNOSIS — R972 Elevated prostate specific antigen [PSA]: Secondary | ICD-10-CM | POA: Diagnosis not present

## 2018-06-15 DIAGNOSIS — E669 Obesity, unspecified: Secondary | ICD-10-CM | POA: Diagnosis not present

## 2018-06-15 DIAGNOSIS — Z1331 Encounter for screening for depression: Secondary | ICD-10-CM | POA: Diagnosis not present

## 2018-06-15 DIAGNOSIS — I10 Essential (primary) hypertension: Secondary | ICD-10-CM | POA: Diagnosis not present

## 2018-06-15 DIAGNOSIS — E1169 Type 2 diabetes mellitus with other specified complication: Secondary | ICD-10-CM | POA: Diagnosis not present

## 2018-06-16 DIAGNOSIS — R972 Elevated prostate specific antigen [PSA]: Secondary | ICD-10-CM | POA: Diagnosis not present

## 2018-06-23 DIAGNOSIS — R972 Elevated prostate specific antigen [PSA]: Secondary | ICD-10-CM | POA: Diagnosis not present

## 2018-08-05 DIAGNOSIS — H40053 Ocular hypertension, bilateral: Secondary | ICD-10-CM | POA: Diagnosis not present

## 2018-10-08 DIAGNOSIS — E1169 Type 2 diabetes mellitus with other specified complication: Secondary | ICD-10-CM | POA: Diagnosis not present

## 2018-10-14 DIAGNOSIS — R972 Elevated prostate specific antigen [PSA]: Secondary | ICD-10-CM | POA: Diagnosis not present

## 2018-10-14 DIAGNOSIS — E785 Hyperlipidemia, unspecified: Secondary | ICD-10-CM | POA: Diagnosis not present

## 2018-10-14 DIAGNOSIS — E1169 Type 2 diabetes mellitus with other specified complication: Secondary | ICD-10-CM | POA: Diagnosis not present

## 2018-10-14 DIAGNOSIS — F39 Unspecified mood [affective] disorder: Secondary | ICD-10-CM | POA: Diagnosis not present

## 2018-10-14 DIAGNOSIS — E669 Obesity, unspecified: Secondary | ICD-10-CM | POA: Diagnosis not present

## 2018-10-14 DIAGNOSIS — I10 Essential (primary) hypertension: Secondary | ICD-10-CM | POA: Diagnosis not present

## 2018-10-14 DIAGNOSIS — F17200 Nicotine dependence, unspecified, uncomplicated: Secondary | ICD-10-CM | POA: Diagnosis not present

## 2018-10-14 DIAGNOSIS — G4733 Obstructive sleep apnea (adult) (pediatric): Secondary | ICD-10-CM | POA: Diagnosis not present

## 2019-02-11 ENCOUNTER — Ambulatory Visit: Payer: PPO | Attending: Internal Medicine

## 2019-02-11 DIAGNOSIS — Z20828 Contact with and (suspected) exposure to other viral communicable diseases: Secondary | ICD-10-CM | POA: Diagnosis not present

## 2019-02-11 DIAGNOSIS — Z20822 Contact with and (suspected) exposure to covid-19: Secondary | ICD-10-CM

## 2019-02-13 LAB — NOVEL CORONAVIRUS, NAA: SARS-CoV-2, NAA: NOT DETECTED

## 2019-02-23 DIAGNOSIS — Z125 Encounter for screening for malignant neoplasm of prostate: Secondary | ICD-10-CM | POA: Diagnosis not present

## 2019-02-23 DIAGNOSIS — E1169 Type 2 diabetes mellitus with other specified complication: Secondary | ICD-10-CM | POA: Diagnosis not present

## 2019-02-23 DIAGNOSIS — E7849 Other hyperlipidemia: Secondary | ICD-10-CM | POA: Diagnosis not present

## 2019-02-25 DIAGNOSIS — I10 Essential (primary) hypertension: Secondary | ICD-10-CM | POA: Diagnosis not present

## 2019-02-25 DIAGNOSIS — R82998 Other abnormal findings in urine: Secondary | ICD-10-CM | POA: Diagnosis not present

## 2019-03-02 DIAGNOSIS — F17200 Nicotine dependence, unspecified, uncomplicated: Secondary | ICD-10-CM | POA: Diagnosis not present

## 2019-03-02 DIAGNOSIS — R972 Elevated prostate specific antigen [PSA]: Secondary | ICD-10-CM | POA: Diagnosis not present

## 2019-03-02 DIAGNOSIS — S46811D Strain of other muscles, fascia and tendons at shoulder and upper arm level, right arm, subsequent encounter: Secondary | ICD-10-CM | POA: Diagnosis not present

## 2019-03-02 DIAGNOSIS — E785 Hyperlipidemia, unspecified: Secondary | ICD-10-CM | POA: Diagnosis not present

## 2019-03-02 DIAGNOSIS — G4733 Obstructive sleep apnea (adult) (pediatric): Secondary | ICD-10-CM | POA: Diagnosis not present

## 2019-03-02 DIAGNOSIS — E669 Obesity, unspecified: Secondary | ICD-10-CM | POA: Diagnosis not present

## 2019-03-02 DIAGNOSIS — E1169 Type 2 diabetes mellitus with other specified complication: Secondary | ICD-10-CM | POA: Diagnosis not present

## 2019-03-02 DIAGNOSIS — Z1331 Encounter for screening for depression: Secondary | ICD-10-CM | POA: Diagnosis not present

## 2019-03-02 DIAGNOSIS — F39 Unspecified mood [affective] disorder: Secondary | ICD-10-CM | POA: Diagnosis not present

## 2019-03-02 DIAGNOSIS — K219 Gastro-esophageal reflux disease without esophagitis: Secondary | ICD-10-CM | POA: Diagnosis not present

## 2019-03-02 DIAGNOSIS — I10 Essential (primary) hypertension: Secondary | ICD-10-CM | POA: Diagnosis not present

## 2019-03-02 DIAGNOSIS — K635 Polyp of colon: Secondary | ICD-10-CM | POA: Diagnosis not present

## 2019-03-02 DIAGNOSIS — Z0000 Encounter for general adult medical examination without abnormal findings: Secondary | ICD-10-CM | POA: Diagnosis not present

## 2019-03-12 DIAGNOSIS — Z1212 Encounter for screening for malignant neoplasm of rectum: Secondary | ICD-10-CM | POA: Diagnosis not present

## 2019-03-25 DIAGNOSIS — G4733 Obstructive sleep apnea (adult) (pediatric): Secondary | ICD-10-CM | POA: Diagnosis not present

## 2019-03-26 DIAGNOSIS — G4733 Obstructive sleep apnea (adult) (pediatric): Secondary | ICD-10-CM | POA: Diagnosis not present

## 2019-03-26 DIAGNOSIS — M25511 Pain in right shoulder: Secondary | ICD-10-CM | POA: Diagnosis not present

## 2019-04-06 DIAGNOSIS — H6121 Impacted cerumen, right ear: Secondary | ICD-10-CM | POA: Diagnosis not present

## 2019-04-06 DIAGNOSIS — H9202 Otalgia, left ear: Secondary | ICD-10-CM | POA: Diagnosis not present

## 2019-04-06 DIAGNOSIS — J309 Allergic rhinitis, unspecified: Secondary | ICD-10-CM | POA: Diagnosis not present

## 2019-04-06 DIAGNOSIS — H6592 Unspecified nonsuppurative otitis media, left ear: Secondary | ICD-10-CM | POA: Diagnosis not present

## 2019-04-06 DIAGNOSIS — H6122 Impacted cerumen, left ear: Secondary | ICD-10-CM | POA: Diagnosis not present

## 2019-04-23 ENCOUNTER — Ambulatory Visit: Payer: PPO | Attending: Internal Medicine

## 2019-04-23 DIAGNOSIS — Z23 Encounter for immunization: Secondary | ICD-10-CM

## 2019-04-23 NOTE — Progress Notes (Signed)
   Covid-19 Vaccination Clinic  Name:  Stephen Collins    MRN: [id number] DOB: 06-Sep-1945  04/23/2019  [name] was observed post Covid-19 immunization for 15 minutes without incident. He was provided with Vaccine Information Sheet and instruction to access the V-Safe system.   [name] was instructed to call 911 with any severe reactions post vaccine: Marland Kitchen Difficulty breathing  . Swelling of face and throat  . A fast heartbeat  . A bad rash all over body  . Dizziness and weakness   Immunizations Administered    Name Date Dose VIS Date Route   Pfizer COVID-19 Vaccine 04/23/2019  2:24 PM 0.3 mL 01/22/2019 Intramuscular   Manufacturer: [place]   Lot: [id number]   [place]: [id number]

## 2019-05-18 ENCOUNTER — Ambulatory Visit: Payer: PPO | Attending: Internal Medicine

## 2019-05-18 DIAGNOSIS — Z23 Encounter for immunization: Secondary | ICD-10-CM

## 2019-05-18 NOTE — Progress Notes (Signed)
   Covid-19 Vaccination Clinic  Name:  Stephen Collins    MRN: [id number] DOB: [birth year]  05/18/2019  [name] was observed post Covid-19 immunization for 15 minutes without incident. He was provided with Vaccine Information Sheet and instruction to access the V-Safe system.   [name] was instructed to call 911 with any severe reactions post vaccine: Marland Kitchen Difficulty breathing  . Swelling of face and throat  . A fast heartbeat  . A bad rash all over body  . Dizziness and weakness   Immunizations Administered    Name Date Dose VIS Date Route   Pfizer COVID-19 Vaccine 05/18/2019 11:31 AM 0.3 mL 01/22/2019 Intramuscular   Manufacturer: [employer]   Lot: [id number]   [place]: [id number]

## 2019-05-23 DIAGNOSIS — G4733 Obstructive sleep apnea (adult) (pediatric): Secondary | ICD-10-CM | POA: Diagnosis not present

## 2019-06-15 DIAGNOSIS — G4733 Obstructive sleep apnea (adult) (pediatric): Secondary | ICD-10-CM | POA: Diagnosis not present

## 2019-06-22 DIAGNOSIS — G4733 Obstructive sleep apnea (adult) (pediatric): Secondary | ICD-10-CM | POA: Diagnosis not present

## 2019-07-19 DIAGNOSIS — G4733 Obstructive sleep apnea (adult) (pediatric): Secondary | ICD-10-CM | POA: Diagnosis not present

## 2019-07-19 DIAGNOSIS — F17200 Nicotine dependence, unspecified, uncomplicated: Secondary | ICD-10-CM | POA: Diagnosis not present

## 2019-07-19 DIAGNOSIS — I10 Essential (primary) hypertension: Secondary | ICD-10-CM | POA: Diagnosis not present

## 2019-07-19 DIAGNOSIS — E669 Obesity, unspecified: Secondary | ICD-10-CM | POA: Diagnosis not present

## 2019-07-19 DIAGNOSIS — R972 Elevated prostate specific antigen [PSA]: Secondary | ICD-10-CM | POA: Diagnosis not present

## 2019-07-19 DIAGNOSIS — E1169 Type 2 diabetes mellitus with other specified complication: Secondary | ICD-10-CM | POA: Diagnosis not present

## 2019-07-19 DIAGNOSIS — F39 Unspecified mood [affective] disorder: Secondary | ICD-10-CM | POA: Diagnosis not present

## 2019-07-23 DIAGNOSIS — G4733 Obstructive sleep apnea (adult) (pediatric): Secondary | ICD-10-CM | POA: Diagnosis not present

## 2019-08-22 DIAGNOSIS — G4733 Obstructive sleep apnea (adult) (pediatric): Secondary | ICD-10-CM | POA: Diagnosis not present

## 2019-09-09 DIAGNOSIS — R972 Elevated prostate specific antigen [PSA]: Secondary | ICD-10-CM | POA: Diagnosis not present

## 2019-09-21 ENCOUNTER — Other Ambulatory Visit: Payer: Self-pay | Admitting: Urology

## 2019-09-21 DIAGNOSIS — R972 Elevated prostate specific antigen [PSA]: Secondary | ICD-10-CM

## 2019-09-22 DIAGNOSIS — G4733 Obstructive sleep apnea (adult) (pediatric): Secondary | ICD-10-CM | POA: Diagnosis not present

## 2019-10-02 ENCOUNTER — Other Ambulatory Visit: Payer: Self-pay

## 2019-10-02 ENCOUNTER — Ambulatory Visit
Admission: RE | Admit: 2019-10-02 | Discharge: 2019-10-02 | Disposition: A | Discharge status: Home / Self Care | Payer: PPO | Source: Ambulatory Visit | Attending: Urology | Admitting: Urology

## 2019-10-02 DIAGNOSIS — R972 Elevated prostate specific antigen [PSA]: Secondary | ICD-10-CM

## 2019-10-02 DIAGNOSIS — N3289 Other specified disorders of bladder: Secondary | ICD-10-CM | POA: Diagnosis not present

## 2019-10-02 DIAGNOSIS — K5730 Diverticulosis of large intestine without perforation or abscess without bleeding: Secondary | ICD-10-CM | POA: Diagnosis not present

## 2019-10-02 DIAGNOSIS — K4090 Unilateral inguinal hernia, without obstruction or gangrene, not specified as recurrent: Secondary | ICD-10-CM | POA: Diagnosis not present

## 2019-10-02 DIAGNOSIS — N210 Calculus in bladder: Secondary | ICD-10-CM | POA: Diagnosis not present

## 2019-10-02 MED ORDER — GADOBENATE DIMEGLUMINE 529 MG/ML IV SOLN
20.0000 mL | Freq: Once | INTRAVENOUS | Status: AC | PRN
  Administered 2019-10-02: 20 mL via INTRAVENOUS

## 2019-10-03 DIAGNOSIS — Z20822 Contact with and (suspected) exposure to covid-19: Secondary | ICD-10-CM | POA: Diagnosis not present

## 2019-10-23 DIAGNOSIS — G4733 Obstructive sleep apnea (adult) (pediatric): Secondary | ICD-10-CM | POA: Diagnosis not present

## 2019-10-27 DIAGNOSIS — G4733 Obstructive sleep apnea (adult) (pediatric): Secondary | ICD-10-CM | POA: Diagnosis not present

## 2019-11-22 DIAGNOSIS — G4733 Obstructive sleep apnea (adult) (pediatric): Secondary | ICD-10-CM | POA: Diagnosis not present

## 2019-12-02 DIAGNOSIS — C61 Malignant neoplasm of prostate: Secondary | ICD-10-CM | POA: Diagnosis not present

## 2019-12-02 DIAGNOSIS — R972 Elevated prostate specific antigen [PSA]: Secondary | ICD-10-CM | POA: Diagnosis not present

## 2019-12-16 DIAGNOSIS — E785 Hyperlipidemia, unspecified: Secondary | ICD-10-CM | POA: Diagnosis not present

## 2019-12-16 DIAGNOSIS — E1169 Type 2 diabetes mellitus with other specified complication: Secondary | ICD-10-CM | POA: Diagnosis not present

## 2019-12-16 DIAGNOSIS — K219 Gastro-esophageal reflux disease without esophagitis: Secondary | ICD-10-CM | POA: Diagnosis not present

## 2019-12-16 DIAGNOSIS — E669 Obesity, unspecified: Secondary | ICD-10-CM | POA: Diagnosis not present

## 2019-12-16 DIAGNOSIS — G4733 Obstructive sleep apnea (adult) (pediatric): Secondary | ICD-10-CM | POA: Diagnosis not present

## 2019-12-16 DIAGNOSIS — Z23 Encounter for immunization: Secondary | ICD-10-CM | POA: Diagnosis not present

## 2019-12-16 DIAGNOSIS — F39 Unspecified mood [affective] disorder: Secondary | ICD-10-CM | POA: Diagnosis not present

## 2019-12-16 DIAGNOSIS — F17200 Nicotine dependence, unspecified, uncomplicated: Secondary | ICD-10-CM | POA: Diagnosis not present

## 2019-12-16 DIAGNOSIS — K635 Polyp of colon: Secondary | ICD-10-CM | POA: Diagnosis not present

## 2019-12-16 DIAGNOSIS — R972 Elevated prostate specific antigen [PSA]: Secondary | ICD-10-CM | POA: Diagnosis not present

## 2019-12-16 DIAGNOSIS — I10 Essential (primary) hypertension: Secondary | ICD-10-CM | POA: Diagnosis not present

## 2019-12-23 DIAGNOSIS — G4733 Obstructive sleep apnea (adult) (pediatric): Secondary | ICD-10-CM | POA: Diagnosis not present

## 2020-01-04 DIAGNOSIS — G4733 Obstructive sleep apnea (adult) (pediatric): Secondary | ICD-10-CM | POA: Diagnosis not present

## 2020-01-22 DIAGNOSIS — G4733 Obstructive sleep apnea (adult) (pediatric): Secondary | ICD-10-CM | POA: Diagnosis not present

## 2020-02-22 DIAGNOSIS — G4733 Obstructive sleep apnea (adult) (pediatric): Secondary | ICD-10-CM | POA: Diagnosis not present

## 2020-03-10 DIAGNOSIS — Z125 Encounter for screening for malignant neoplasm of prostate: Secondary | ICD-10-CM | POA: Diagnosis not present

## 2020-03-10 DIAGNOSIS — Z0000 Encounter for general adult medical examination without abnormal findings: Secondary | ICD-10-CM | POA: Diagnosis not present

## 2020-03-10 DIAGNOSIS — E785 Hyperlipidemia, unspecified: Secondary | ICD-10-CM | POA: Diagnosis not present

## 2020-03-10 DIAGNOSIS — E1169 Type 2 diabetes mellitus with other specified complication: Secondary | ICD-10-CM | POA: Diagnosis not present

## 2020-03-16 DIAGNOSIS — Z1212 Encounter for screening for malignant neoplasm of rectum: Secondary | ICD-10-CM | POA: Diagnosis not present

## 2020-03-16 DIAGNOSIS — F39 Unspecified mood [affective] disorder: Secondary | ICD-10-CM | POA: Diagnosis not present

## 2020-03-16 DIAGNOSIS — Z0000 Encounter for general adult medical examination without abnormal findings: Secondary | ICD-10-CM | POA: Diagnosis not present

## 2020-03-16 DIAGNOSIS — R972 Elevated prostate specific antigen [PSA]: Secondary | ICD-10-CM | POA: Diagnosis not present

## 2020-03-16 DIAGNOSIS — E1169 Type 2 diabetes mellitus with other specified complication: Secondary | ICD-10-CM | POA: Diagnosis not present

## 2020-03-16 DIAGNOSIS — Z1331 Encounter for screening for depression: Secondary | ICD-10-CM | POA: Diagnosis not present

## 2020-03-16 DIAGNOSIS — E785 Hyperlipidemia, unspecified: Secondary | ICD-10-CM | POA: Diagnosis not present

## 2020-03-16 DIAGNOSIS — I10 Essential (primary) hypertension: Secondary | ICD-10-CM | POA: Diagnosis not present

## 2020-03-16 DIAGNOSIS — Z1339 Encounter for screening examination for other mental health and behavioral disorders: Secondary | ICD-10-CM | POA: Diagnosis not present

## 2020-03-16 DIAGNOSIS — K635 Polyp of colon: Secondary | ICD-10-CM | POA: Diagnosis not present

## 2020-03-16 DIAGNOSIS — F17200 Nicotine dependence, unspecified, uncomplicated: Secondary | ICD-10-CM | POA: Diagnosis not present

## 2020-03-16 DIAGNOSIS — R82998 Other abnormal findings in urine: Secondary | ICD-10-CM | POA: Diagnosis not present

## 2020-03-16 DIAGNOSIS — E669 Obesity, unspecified: Secondary | ICD-10-CM | POA: Diagnosis not present

## 2020-03-16 DIAGNOSIS — G4733 Obstructive sleep apnea (adult) (pediatric): Secondary | ICD-10-CM | POA: Diagnosis not present

## 2020-03-24 DIAGNOSIS — G4733 Obstructive sleep apnea (adult) (pediatric): Secondary | ICD-10-CM | POA: Diagnosis not present

## 2020-06-09 DIAGNOSIS — S80861A Insect bite (nonvenomous), right lower leg, initial encounter: Secondary | ICD-10-CM | POA: Diagnosis not present

## 2020-06-09 DIAGNOSIS — W57XXXA Bitten or stung by nonvenomous insect and other nonvenomous arthropods, initial encounter: Secondary | ICD-10-CM | POA: Diagnosis not present

## 2020-08-15 DIAGNOSIS — I10 Essential (primary) hypertension: Secondary | ICD-10-CM | POA: Diagnosis not present

## 2020-08-15 DIAGNOSIS — E785 Hyperlipidemia, unspecified: Secondary | ICD-10-CM | POA: Diagnosis not present

## 2020-08-15 DIAGNOSIS — F17200 Nicotine dependence, unspecified, uncomplicated: Secondary | ICD-10-CM | POA: Diagnosis not present

## 2020-08-15 DIAGNOSIS — F39 Unspecified mood [affective] disorder: Secondary | ICD-10-CM | POA: Diagnosis not present

## 2020-08-15 DIAGNOSIS — E1169 Type 2 diabetes mellitus with other specified complication: Secondary | ICD-10-CM | POA: Diagnosis not present

## 2020-08-15 DIAGNOSIS — K219 Gastro-esophageal reflux disease without esophagitis: Secondary | ICD-10-CM | POA: Diagnosis not present

## 2020-08-15 DIAGNOSIS — E669 Obesity, unspecified: Secondary | ICD-10-CM | POA: Diagnosis not present

## 2020-08-15 DIAGNOSIS — G4733 Obstructive sleep apnea (adult) (pediatric): Secondary | ICD-10-CM | POA: Diagnosis not present

## 2020-08-15 DIAGNOSIS — R972 Elevated prostate specific antigen [PSA]: Secondary | ICD-10-CM | POA: Diagnosis not present

## 2020-08-15 DIAGNOSIS — K635 Polyp of colon: Secondary | ICD-10-CM | POA: Diagnosis not present

## 2020-10-17 DIAGNOSIS — C61 Malignant neoplasm of prostate: Secondary | ICD-10-CM | POA: Diagnosis not present

## 2020-11-27 DIAGNOSIS — R972 Elevated prostate specific antigen [PSA]: Secondary | ICD-10-CM | POA: Diagnosis not present

## 2020-11-27 DIAGNOSIS — C61 Malignant neoplasm of prostate: Secondary | ICD-10-CM | POA: Diagnosis not present

## 2020-12-01 ENCOUNTER — Other Ambulatory Visit (HOSPITAL_COMMUNITY): Payer: Self-pay | Admitting: Urology

## 2020-12-01 DIAGNOSIS — C61 Malignant neoplasm of prostate: Secondary | ICD-10-CM

## 2020-12-07 ENCOUNTER — Encounter (HOSPITAL_COMMUNITY)
Admission: RE | Admit: 2020-12-07 | Discharge: 2020-12-07 | Disposition: A | Discharge status: Home / Self Care | Payer: PPO | Source: Ambulatory Visit | Attending: Urology | Admitting: Urology

## 2020-12-07 ENCOUNTER — Other Ambulatory Visit: Payer: Self-pay

## 2020-12-07 DIAGNOSIS — C61 Malignant neoplasm of prostate: Secondary | ICD-10-CM | POA: Diagnosis not present

## 2020-12-07 DIAGNOSIS — R590 Localized enlarged lymph nodes: Secondary | ICD-10-CM | POA: Diagnosis not present

## 2020-12-07 DIAGNOSIS — C7951 Secondary malignant neoplasm of bone: Secondary | ICD-10-CM | POA: Diagnosis not present

## 2020-12-07 MED ORDER — PIFLIFOLASTAT F 18 (PYLARIFY) INJECTION
9.0000 | Freq: Once | INTRAVENOUS | Status: AC
  Administered 2020-12-07: 9 via INTRAVENOUS

## 2020-12-07 MED ORDER — FLUDEOXYGLUCOSE F - 18 (FDG) INJECTION: 9.3000 | Freq: Once | INTRAVENOUS | Status: DC

## 2020-12-11 ENCOUNTER — Telehealth: Payer: Self-pay | Admitting: Oncology

## 2020-12-11 NOTE — Telephone Encounter (Signed)
Scheduled appt per 10/31 referral. Pt is aware of appt date and time.  

## 2020-12-12 ENCOUNTER — Telehealth: Payer: Self-pay | Admitting: Oncology

## 2020-12-12 NOTE — Telephone Encounter (Signed)
R/s new patient appt to an earlier date per pt request and approval by Dr. Alen Blew. Pt is aware of new appt date and time.

## 2020-12-15 ENCOUNTER — Ambulatory Visit: Payer: PPO | Admitting: Oncology

## 2020-12-21 ENCOUNTER — Inpatient Hospital Stay: Payer: PPO | Attending: Oncology | Admitting: Oncology

## 2020-12-21 ENCOUNTER — Ambulatory Visit: Payer: PPO | Admitting: Oncology

## 2020-12-21 ENCOUNTER — Other Ambulatory Visit: Payer: Self-pay

## 2020-12-21 VITALS — BP 137/78 | HR 81 | Temp 98.1°F | Resp 18 | Ht 68.0 in | Wt 215.2 lb

## 2020-12-21 DIAGNOSIS — C7951 Secondary malignant neoplasm of bone: Secondary | ICD-10-CM | POA: Diagnosis not present

## 2020-12-21 DIAGNOSIS — C779 Secondary and unspecified malignant neoplasm of lymph node, unspecified: Secondary | ICD-10-CM

## 2020-12-21 DIAGNOSIS — C61 Malignant neoplasm of prostate: Secondary | ICD-10-CM | POA: Insufficient documentation

## 2020-12-21 DIAGNOSIS — Z5111 Encounter for antineoplastic chemotherapy: Secondary | ICD-10-CM | POA: Insufficient documentation

## 2020-12-21 DIAGNOSIS — F17210 Nicotine dependence, cigarettes, uncomplicated: Secondary | ICD-10-CM | POA: Diagnosis not present

## 2020-12-21 MED ORDER — PROCHLORPERAZINE MALEATE 10 MG PO TABS: 10.0000 mg | ORAL_TABLET | Freq: Four times a day (QID) | ORAL | 0 refills | Status: DC | PRN

## 2020-12-21 MED ORDER — LIDOCAINE-PRILOCAINE 2.5-2.5 % EX CREA: 1.0000 "application " | TOPICAL_CREAM | CUTANEOUS | 0 refills | Status: AC | PRN

## 2020-12-21 NOTE — Progress Notes (Signed)
Reason for the request:    Prostate cancer  HPI: I was asked by Dr. Louis Meckel to evaluate [name] for the evaluation of prostate cancer.  He is a 75 year old man with a history of hypertension, hyperlipidemia and sleep apnea and diagnosis of prostate cancer in 2021.  At that time he was found to have a Gleason score of 3+3 equal 6 with a PSA of 14.8.  He was on active surveillance at that time.  His PSA in July 2022 was 30 and subsequently was 83.2 in September 2022.  Based on these findings he underwent a PSMA PET scan obtained on December 07, 2020 showed uptake in the left prostate gland consistent with prostate primary.  It is multiple small radiotracer uptake in the lymph nodes including pelvic, para-aortic, retroperitoneal and mediastinal.  Widespread uptake in bony skeleton was also noted with more than 100 lesions. Prostate biopsy obtained on November 27, 2020 showed Gleason score 4+5 = 9 in the lateral region in all cores.   Clinically, he reports no major complaints at this time.  He denies any nausea, vomiting or abdominal pain.  He continues to be active and attends to activities of daily living.  He denies any bone pain or pathological fractures.  He denies any weight loss or appetite changes.   does not report any headaches, blurry vision, syncope or seizures. Does not report any fevers, chills or sweats.  Does not report any cough, wheezing or hemoptysis.  Does not report any chest pain, palpitation, orthopnea or leg edema.  Does not report any nausea, vomiting or abdominal pain.  Does not report any constipation or diarrhea.  Does not report any skeletal complaints.    Does not report frequency, urgency or hematuria.  Does not report any skin rashes or lesions. Does not report any heat or cold intolerance.  Does not report any lymphadenopathy or petechiae.  Does not report any anxiety or depression.  Remaining review of systems is negative.     Past Medical History:  Diagnosis Date    Bilateral renal cysts    Bladder stones    Depression    Frequency of urination    History of kidney stones    Hyperlipidemia    Hypertension    OSA on CPAP    Wears glasses   :   Past Surgical History:  Procedure Laterality Date   COLONOSCOPY  2006   CYSTOSCOPY WITH LITHOLAPAXY N/A 04/14/2015   Procedure: CYSTOSCOPY WITH LITHOLAPAXY;  Surgeon: Ardis Hughs, MD;  Location: [facility];  Service: Urology;  Laterality: N/A;   FLEXIBLE SIGMOIDOSCOPY N/A 09/22/2017   Procedure: FLEXIBLE SIGMOIDOSCOPY;  Surgeon: Laurence Spates, MD;  Location: [facility];  Service: Endoscopy;  Laterality: N/A;   POLYPECTOMY  09/22/2017   Procedure: POLYPECTOMY;  Surgeon: Laurence Spates, MD;  Location: [facility];  Service: Endoscopy;;   SUBMUCOSAL INJECTION  09/22/2017   Procedure: SUBMUCOSAL INJECTION;  Surgeon: Laurence Spates, MD;  Location: [facility];  Service: Endoscopy;;   TONSILLECTOMY  as child  :   Current Outpatient Medications:    amLODipine (NORVASC) 10 MG tablet, Take 10 mg by mouth every morning., Disp: , Rfl:    atorvastatin (LIPITOR) 20 MG tablet, Take 20 mg by mouth every morning., Disp: , Rfl:    buPROPion (WELLBUTRIN XL) 300 MG 24 hr tablet, Take 300 mg by mouth every morning., Disp: , Rfl:    irbesartan-hydrochlorothiazide (AVALIDE) 300-12.5 MG tablet, Take 1 tablet by mouth every morning. ,  Disp: , Rfl:    Multiple Vitamin (MULTIVITAMIN) tablet, Take 1 tablet by mouth daily., Disp: , Rfl:    phenazopyridine (PYRIDIUM) 200 MG tablet, Take 1 tablet (200 mg total) by mouth 3 (three) times daily as needed for pain. (Patient not taking: Reported on 09/15/2017), Disp: 10 tablet, Rfl: 0   traMADol (ULTRAM) 50 MG tablet, Take 1-2 tablets (50-100 mg total) by mouth every 6 (six) hours as needed for moderate pain. (Patient not taking: Reported on 09/15/2017), Disp: 10 tablet, Rfl: 0:  No Known Allergies:  No family history on file.:   Social History   Socioeconomic  History   Marital status: Single    Spouse name: Not on file   Number of children: Not on file   Years of education: Not on file   Highest education level: Not on file  Occupational History   Not on file  Tobacco Use   Smoking status: Every Day    Packs/day: 0.50    Years: 48.00    Pack years: 24.00    Types: Cigarettes   Smokeless tobacco: Never  Substance and Sexual Activity   Alcohol use: Yes    Alcohol/week: 12.0 standard drinks    Types: 12 Glasses of wine per week    Comment: 1-2 wine daily   Drug use: No   Sexual activity: Not on file  Other Topics Concern   Not on file  Social History Narrative   Not on file   Social Determinants of Health   Financial Resource Strain: Not on file  Food Insecurity: Not on file  Transportation Needs: Not on file  Physical Activity: Not on file  Stress: Not on file  Social Connections: Not on file  Intimate Partner Violence: Not on file  :  Pertinent items are noted in HPI.  Exam: Blood pressure 137/78, pulse 81, temperature 98.1 F (36.7 C), temperature source Temporal, resp. rate 18, height 5\' 8"  (1.727 m), weight 215 lb 3.2 oz (97.6 kg), SpO2 99 %. ECOG 0 General appearance: alert and cooperative appeared without distress. Head: atraumatic without any abnormalities. Eyes: conjunctivae/corneas clear. PERRL.  Sclera anicteric. Throat: lips, mucosa, and tongue normal; without oral thrush or ulcers. Resp: clear to auscultation bilaterally without rhonchi, wheezes or dullness to percussion. Cardio: regular rate and rhythm, S1, S2 normal, no murmur, click, rub or gallop GI: soft, non-tender; bowel sounds normal; no masses,  no organomegaly Skin: Skin color, texture, turgor normal. No rashes or lesions Lymph nodes: Cervical, supraclavicular, and axillary nodes normal. Neurologic: Grossly normal without any motor, sensory or deep tendon reflexes. Musculoskeletal: No joint deformity or effusion.    NM PET (PSMA) SKULL TO MID  THIGH  Result Date: 12/10/2020 CLINICAL DATA:  Prostate cancer with elevated PSA equal 83. EXAM: NUCLEAR MEDICINE PET SKULL BASE TO THIGH TECHNIQUE: 9.3 mCi F18 Piflufolastat (Pylarify) was injected intravenously. Full-ring PET imaging was performed from the skull base to thigh after the radiotracer. CT data was obtained and used for attenuation correction and anatomic localization. COMPARISON:  prostate MRI 10/02/2019 FINDINGS: NECK No radiotracer activity in neck lymph nodes. Incidental CT finding: None CHEST Several small intensely radiotracer avid mediastinal and hilar nodes. For example RIGHT lower paratracheal node measuring 8 mm (image 69/series 4) with SUV max equal 8.6. Subcentimeter subcarinal node with SUV max equal 9.2. Similar intense radiotracer activity in small hilar lymph nodes. No suspicious pulmonary nodules. Incidental CT finding: None ABDOMEN/PELVIS Prostate: Intense radiotracer activity within the anterior and LEFT anterior aspect of  the prostate gland involving the mid gland and apex. Broad lesion of intense radiotracer activity with SUV max equal 13.1. Lymph nodes: Intensely radiotracer avid but very small pelvic lymph nodes. LEFT internal iliac lymph node measuring 6 mm (162/4) with SUV max equal 18.2. Small LEFT common iliac node measuring 8 mm (143/4) with SUV max equal 12.6. Several similar periaortic retroperitoneal nodes with intense radiotracer activity. For example node LEFT aorta at the level the kidneys measures 7 mm with SUV max equal 13.7. Larger node ventral to the aorta measures 14 mm with SUV max equal 18.2 on image 140. Liver: No liver metastasis. Incidental CT finding: None SKELETON There are widespread focal lesions with intense metabolic activity within the axillary and appendicular skeleton. Lesion too numerous to count and number over 100. Lesions are intensely radiotracer avid and accompanied by subtle sclerotic lesions. Example lesion in the posterior RIGHT  acetabulum with SUV max equal 22. Lesion in the proximal LEFT femur SUV max equal 23.5. Lesion in the T9 vertebral body with SUV max equal 20.4. Lesions involve the shoulder girdles and calvarium. Example lesion in the clivus with SUV max equal 25.6 IMPRESSION: 1. Intense radiotracer activity with involving a broad region of the anterior and anterior LEFT prostate gland. Findings consistent primary prostate carcinoma. 2. Multiple small intensely radiotracer avid lymph nodes in the pelvis, periaortic retroperitoneum and MEDIASTINUM consistent with metastatic prostate cancer adenopathy. 3. Widespread focal radiotracer avid prostate cancer skeletal metastasis involving the axillary and appendicular skeleton. Lesions number greater than 100. Electronically Signed   By: Suzy Bouchard M.D.   On: 12/10/2020 12:48    Assessment and Plan:   75 year old with:  1.  Prostate cancer diagnosed in 2021 with Gleason score 6 and a PSA of 14.  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  Repeat biopsy showed a Gleason score 4+5 = 9.  The natural course of this disease was reviewed at this time and treatment options were discussed.  He understands he has advanced incurable disease with many treatment choices they will offer significant disease control and palliation.  The cornerstone of treating his disease would be androgen deprivation therapy which is already started under the care of Dr. Louis Meckel.  Additional therapy at this time including Taxotere chemotherapy, androgen receptor pathway inhibitors or combination of the above were discussed.  The rationale for therapy escalation in his case was reviewed.  Given his high-volume, high risk [place] prostate cancer he would be a reasonable candidate for triple therapy.  Complication associated with Taxotere chemotherapy were discussed.  These include nausea, vomiting, myelosuppression, neutropenia  and possible sepsis were reiterated.  After discussion he is agreeable to proceed.  We will arrange for chemotherapy education class  Abiraterone 1000 mg with prednisone could also be added which was discussed at this time and will be deferred after the start of chemotherapy to reduce complications and complexity of scheduling.     2.  IV access: Risks and benefits of using Port-A-Cath versus peripheral veins was discussed today.  Complication associated with Port-A-Cath insertion include bleeding, infection and thrombosis.  After discussing the risks and benefits, he is agreeable to proceed.  We will arrange for placement in the near future with Emla cream prescription available to him.   3.  Antiemetics: Prescription for Compazine was made available to him.   4.  Androgen deprivation therapy: This will continue indefinitely.  This was started under the care of Dr. Louis Meckel at  will resume here if needed.   5.  Goals of care: Therapy is palliative although aggressive measures are warranted given his excellent performance status.  6.  Bone directed therapy: Calcium and vitamin D supplements are recommended and will consider Xgeva after obtaining dental clearance.  This will be addressed further in the future.  7.  Growth factor support: He will require growth factor after each cycle of therapy given his risk of neutropenia and possible sepsis.   8.  Follow-up: We will be in the immediate future to start chemotherapy.  60  minutes were dedicated to this visit. The time was spent on reviewing laboratory data, imaging studies, discussing treatment options,  and answering questions regarding future plan.      A copy of this consult has been forwarded to the requesting physician.

## 2020-12-21 NOTE — Progress Notes (Signed)
START ON PATHWAY REGIMEN - Prostate     Abiraterone/prednisone: A cycle is every 28 days:     Abiraterone acetate (conventional)      Prednisone    Docetaxel cycles 1 through 6: A cycle is every 21 days:     Docetaxel      [id number]   **Always confirm dose/schedule in your pharmacy ordering system**  Patient Characteristics: Adenocarcinoma, Recurrent/New Systemic Disease, Non-Castrate, M1 Histology: Adenocarcinoma Therapeutic Status: Recurrent/New Systemic Disease Intent of Therapy: Non-Curative / Palliative Intent, Discussed with Patient

## 2020-12-21 NOTE — Progress Notes (Signed)
Introduced myself to patient as the prostate nurse navigator and discussed my role.  No barriers to care identified at this time.  He is here to discuss his treatment options with Dr. Shadad.  I gave him my business card and asked him to call me with questions or concerns.  Verbalized understanding.   

## 2020-12-22 ENCOUNTER — Telehealth: Payer: Self-pay | Admitting: Oncology

## 2020-12-22 NOTE — Telephone Encounter (Signed)
Scheduled per 11/10 los, patient has been called and voicemail was left.

## 2020-12-25 ENCOUNTER — Other Ambulatory Visit: Payer: Self-pay | Admitting: Oncology

## 2020-12-25 ENCOUNTER — Telehealth: Payer: Self-pay

## 2020-12-25 NOTE — Progress Notes (Signed)
RN contacted IR department to inquire if port placement can be moved up due to first chemotherapy starting on 11/29.   Patient is now scheduled for port placement on 11/22.  Pt aware, verbalized understanding.  RN confirmed upcoming chemo education class with patient.

## 2020-12-25 NOTE — Telephone Encounter (Signed)
Mr. Malter daughter, Lanelle Bal, called to see if she could be added to get information about her dad.  She knows he must authorize to add her to get information and she said he told her he would sign a medical release on Thursday.  I advised her that he needed to sign up for MyChart and then he can share his information with her that way.  He has his patient education appointment on 11/17 and I have put in the notes to call her for a phone visit with her father's consent of course.  She will be the point of contact and will be coming to care for her dad for his port placement on 11/22 but she is not currently listed on his chart to give out information.  She was happy that we can call her on Thursday when her dad has his chemo education.  She said he is overwhelmed and not understanding some things.  Sending to [facility] so they can discuss in his chemo education appt. Gardiner Rhyme, RN

## 2020-12-26 ENCOUNTER — Encounter: Payer: Self-pay | Admitting: Oncology

## 2020-12-27 ENCOUNTER — Ambulatory Visit: Payer: PPO | Admitting: Oncology

## 2020-12-27 ENCOUNTER — Telehealth: Payer: Self-pay | Admitting: Oncology

## 2020-12-27 NOTE — Telephone Encounter (Signed)
Scheduled per los, patient has been called and notified. 

## 2020-12-28 ENCOUNTER — Other Ambulatory Visit: Payer: Self-pay

## 2020-12-28 ENCOUNTER — Encounter: Payer: Self-pay | Admitting: Oncology

## 2020-12-28 ENCOUNTER — Inpatient Hospital Stay: Payer: PPO

## 2020-12-28 NOTE — Progress Notes (Signed)
Met with patient at registration to introduce myself as Arboriculturist and to offer available resources.  Discussed one-time $1000 Radio broadcast assistant to assist with personal expenses while going through treatment as well as additional $200 Prostate grant.  Gave him my card if interested in applying and for any additional financial questions or concerns.

## 2021-01-01 ENCOUNTER — Other Ambulatory Visit: Payer: Self-pay | Admitting: Internal Medicine

## 2021-01-01 NOTE — Progress Notes (Signed)
Pharmacist Chemotherapy Monitoring - Initial Assessment    Anticipated start date: 01/09/21   The following has been reviewed per standard work regarding the patient's treatment regimen: The patient's diagnosis, treatment plan and drug doses, and organ/hematologic function Lab orders and baseline tests specific to treatment regimen  The treatment plan start date, drug sequencing, and pre-medications Prior authorization status  Patient's documented medication list, including drug-drug interaction screen and prescriptions for anti-emetics and supportive care specific to the treatment regimen The drug concentrations, fluid compatibility, administration routes, and timing of the medications to be used The patient's access for treatment and lifetime cumulative dose history, if applicable  The patient's medication allergies and previous infusion related reactions, if applicable   Changes made to treatment plan:  N/A  Follow up needed:  N/A   Larene Beach, [place], 01/01/2021  2:36 PM

## 2021-01-02 ENCOUNTER — Telehealth: Payer: Self-pay | Admitting: *Deleted

## 2021-01-02 ENCOUNTER — Ambulatory Visit (HOSPITAL_COMMUNITY)
Admission: RE | Admit: 2021-01-02 | Discharge: 2021-01-02 | Disposition: A | Discharge status: Home / Self Care | Payer: PPO | Source: Ambulatory Visit | Attending: Oncology | Admitting: Oncology

## 2021-01-02 DIAGNOSIS — C61 Malignant neoplasm of prostate: Secondary | ICD-10-CM

## 2021-01-02 MED ORDER — SODIUM CHLORIDE 0.9 % IV SOLN: INTRAVENOUS | Status: DC

## 2021-01-02 NOTE — Telephone Encounter (Signed)
Patient failed to NPO prior to PCA placement so that procedure was rescheduled.

## 2021-01-02 NOTE — Progress Notes (Signed)
Patient had protein shake and juice at 1100 today. Rowe Robert PA and Dr Reesa Chew in to see patient. Patient would like sedation. PAC placement to be rescheduled.

## 2021-01-02 NOTE — Progress Notes (Signed)
Pt notified RN that port placement had to be rescheduled due to not being NPO and they were unable to get him in until after first treatment is scheduled.   RN called [facility], pt is not scheduled for 11/25 @ 10am.  NPO 6 hours prior, and driver needed.    Pt notified and pt's daughter notified.  Verbalized understanding, no further needs at this time.

## 2021-01-03 ENCOUNTER — Other Ambulatory Visit: Payer: Self-pay | Admitting: Radiology

## 2021-01-05 ENCOUNTER — Ambulatory Visit (HOSPITAL_COMMUNITY)
Admission: RE | Admit: 2021-01-05 | Discharge: 2021-01-05 | Disposition: A | Discharge status: Home / Self Care | Payer: PPO | Source: Ambulatory Visit | Attending: Oncology | Admitting: Oncology

## 2021-01-05 ENCOUNTER — Other Ambulatory Visit: Payer: Self-pay

## 2021-01-05 ENCOUNTER — Other Ambulatory Visit: Payer: Self-pay | Admitting: Oncology

## 2021-01-05 ENCOUNTER — Encounter (HOSPITAL_COMMUNITY): Payer: Self-pay

## 2021-01-05 DIAGNOSIS — Z452 Encounter for adjustment and management of vascular access device: Secondary | ICD-10-CM | POA: Diagnosis not present

## 2021-01-05 DIAGNOSIS — C7951 Secondary malignant neoplasm of bone: Secondary | ICD-10-CM | POA: Insufficient documentation

## 2021-01-05 DIAGNOSIS — C61 Malignant neoplasm of prostate: Secondary | ICD-10-CM

## 2021-01-05 HISTORY — PX: IR IMAGING GUIDED PORT INSERTION: IMG5740

## 2021-01-05 MED ORDER — LIDOCAINE HCL 1 % IJ SOLN
INTRAMUSCULAR | Status: AC
  Filled 2021-01-05: qty 20

## 2021-01-05 MED ORDER — LIDOCAINE-EPINEPHRINE 1 %-1:100000 IJ SOLN
INTRAMUSCULAR | Status: AC
  Administered 2021-01-05: 20 mL
  Filled 2021-01-05: qty 1

## 2021-01-05 MED ORDER — MIDAZOLAM HCL 2 MG/2ML IJ SOLN
INTRAMUSCULAR | Status: AC | PRN
  Administered 2021-01-05: 1 mg via INTRAVENOUS
  Administered 2021-01-05: .5 mg via INTRAVENOUS

## 2021-01-05 MED ORDER — HEPARIN SOD (PORK) LOCK FLUSH 100 UNIT/ML IV SOLN
INTRAVENOUS | Status: AC
  Filled 2021-01-05: qty 5

## 2021-01-05 MED ORDER — FENTANYL CITRATE (PF) 100 MCG/2ML IJ SOLN
INTRAMUSCULAR | Status: AC
  Filled 2021-01-05: qty 2

## 2021-01-05 MED ORDER — FENTANYL CITRATE (PF) 100 MCG/2ML IJ SOLN
INTRAMUSCULAR | Status: AC | PRN
  Administered 2021-01-05 (×2): 25 ug via INTRAVENOUS

## 2021-01-05 MED ORDER — MIDAZOLAM HCL 2 MG/2ML IJ SOLN
INTRAMUSCULAR | Status: AC
  Filled 2021-01-05: qty 2

## 2021-01-05 MED ORDER — SODIUM CHLORIDE 0.9 % IV SOLN: INTRAVENOUS | Status: DC

## 2021-01-05 NOTE — Procedures (Signed)
  Procedure: R ij  PowerPORT placement    EBL:   minimal Complications:  none immediate  See full dictation in [employer].  Dillard Cannon MD Main # [phone] Pager  [phone] Mobile [phone]

## 2021-01-05 NOTE — H&P (Signed)
Chief Complaint: Prostate cancer  Referring Physician(s): OFBPZW  Supervising Physician: Arne Cleveland  Patient Status: [name] - Out-pt  History of Present Illness: Stephen Collins is a 75 y.o. male with prostate cancer who is here today for placement of a tunneled catheter with port for chemotherapy.  PSMA PET done 12/10/20 showed= 1. Intense radiotracer activity with involving a broad region of the anterior and anterior LEFT prostate gland. Findings consistent primary prostate carcinoma. 2. Multiple small intensely radiotracer avid lymph nodes in the pelvis, periaortic retroperitoneum and MEDIASTINUM consistent with metastatic prostate cancer adenopathy. 3. Widespread focal radiotracer avid prostate cancer skeletal metastasis involving the axillary and appendicular skeleton. Lesions number greater than 100.  He is NPO. No nausea/vomiting. No Fever/chills. ROS negative.   Past Medical History:  Diagnosis Date   Bilateral renal cysts    Bladder stones    Depression    Frequency of urination    History of kidney stones    Hyperlipidemia    Hypertension    OSA on CPAP    Wears glasses     Past Surgical History:  Procedure Laterality Date   COLONOSCOPY  2006   CYSTOSCOPY WITH LITHOLAPAXY N/A 04/14/2015   Procedure: CYSTOSCOPY WITH LITHOLAPAXY;  Surgeon: Ardis Hughs, MD;  Location: [facility];  Service: Urology;  Laterality: N/A;   FLEXIBLE SIGMOIDOSCOPY N/A 09/22/2017   Procedure: FLEXIBLE SIGMOIDOSCOPY;  Surgeon: Laurence Spates, MD;  Location: [facility];  Service: Endoscopy;  Laterality: N/A;   POLYPECTOMY  09/22/2017   Procedure: POLYPECTOMY;  Surgeon: Laurence Spates, MD;  Location: [facility];  Service: Endoscopy;;   SUBMUCOSAL INJECTION  09/22/2017   Procedure: SUBMUCOSAL INJECTION;  Surgeon: Laurence Spates, MD;  Location: [facility];  Service: Endoscopy;;   TONSILLECTOMY  as child    Allergies: Patient has no known  allergies.  Medications: Prior to Admission medications   Medication Sig Start Date End Date Taking? Authorizing Provider  buPROPion (WELLBUTRIN XL) 150 MG 24 hr tablet Take 150 mg by mouth daily.   Yes [provider]  CALCIUM PO Take 1,200 mg by mouth daily.   Yes [provider]  cetirizine (ZYRTEC) 10 MG tablet Take 10 mg by mouth daily as needed for allergies.   Yes [provider]  cholecalciferol (VITAMIN D3) 25 MCG (1000 UNIT) tablet Take 1,000 Units by mouth daily.   Yes [provider]  irbesartan-hydrochlorothiazide (AVALIDE) 300-12.5 MG tablet Take 1 tablet by mouth every morning.    Yes [provider]  Multiple Vitamins-Minerals (ADULT GUMMY PO) Take 2 capsules by mouth daily.   Yes [provider]  naproxen sodium (ALEVE) 220 MG tablet Take 220 mg by mouth daily as needed (pain).   Yes [provider]  rosuvastatin (CRESTOR) 20 MG tablet Take 20 mg by mouth daily.   Yes [provider]  lidocaine-prilocaine (EMLA) cream Apply 1 application topically as needed. 12/21/20   [name], MD  prochlorperazine (COMPAZINE) 10 MG tablet TAKE 1 TABLET(10 MG) BY MOUTH EVERY 6 HOURS AS NEEDED FOR NAUSEA OR VOMITING 12/25/20   [name], MD     No family history on file.  Social History   Socioeconomic History   Marital status: Single    Spouse name: Not on file   Number of children: Not on file   Years of education: Not on file   Highest education level: Not on file  Occupational History   Not on file  Tobacco  Use   Smoking status: Every Day    Packs/day: 0.50    Years: 48.00    Pack years: 24.00    Types: Cigarettes   Smokeless tobacco: Never  Substance and Sexual Activity   Alcohol use: Yes    Alcohol/week: 12.0 standard drinks    Types: 12 Glasses of wine per week    Comment: 1-2 wine daily   Drug use: No   Sexual activity: Not on file  Other Topics Concern   Not on file  Social  History Narrative   Not on file   Social Determinants of Health   Financial Resource Strain: Not on file  Food Insecurity: Not on file  Transportation Needs: Not on file  Physical Activity: Not on file  Stress: Not on file  Social Connections: Not on file     Review of Systems: A 12 point ROS discussed and pertinent positives are indicated in the HPI above.  All other systems are negative.  Review of Systems  Vital Signs: BP (!) 163/73 (BP Location: Right Arm)   Pulse 93   Temp 98 F (36.7 C) (Oral)   Ht 5\' 8"  (1.727 m)   Wt 95.7 kg   SpO2 98%   BMI 32.08 kg/m   Physical Exam Constitutional:      Appearance: Normal appearance.  HENT:     Head: Normocephalic and atraumatic.  Cardiovascular:     Rate and Rhythm: Normal rate.  Pulmonary:     Effort: Pulmonary effort is normal. No respiratory distress.  Abdominal:     Palpations: Abdomen is soft.     Tenderness: There is no abdominal tenderness.  Skin:    General: Skin is warm and dry.  Neurological:     General: No focal deficit present.     Mental Status: He is alert and oriented to person, place, and time.  Psychiatric:        Mood and Affect: Mood normal.        Behavior: Behavior normal.        Thought Content: Thought content normal.        Judgment: Judgment normal.    Imaging: NM PET (PSMA) SKULL TO MID THIGH  Result Date: 12/10/2020 CLINICAL DATA:  Prostate cancer with elevated PSA equal 83. EXAM: NUCLEAR MEDICINE PET SKULL BASE TO THIGH TECHNIQUE: 9.3 mCi F18 Piflufolastat (Pylarify) was injected intravenously. Full-ring PET imaging was performed from the skull base to thigh after the radiotracer. CT data was obtained and used for attenuation correction and anatomic localization. COMPARISON:  prostate MRI 10/02/2019 FINDINGS: NECK No radiotracer activity in neck lymph nodes. Incidental CT finding: None CHEST Several small intensely radiotracer avid mediastinal and hilar nodes. For example RIGHT lower  paratracheal node measuring 8 mm (image 69/series 4) with SUV max equal 8.6. Subcentimeter subcarinal node with SUV max equal 9.2. Similar intense radiotracer activity in small hilar lymph nodes. No suspicious pulmonary nodules. Incidental CT finding: None ABDOMEN/PELVIS Prostate: Intense radiotracer activity within the anterior and LEFT anterior aspect of the prostate gland involving the mid gland and apex. Broad lesion of intense radiotracer activity with SUV max equal 13.1. Lymph nodes: Intensely radiotracer avid but very small pelvic lymph nodes. LEFT internal iliac lymph node measuring 6 mm (162/4) with SUV max equal 18.2. Small LEFT common iliac node measuring 8 mm (143/4) with SUV max equal 12.6. Several similar periaortic retroperitoneal nodes with intense radiotracer activity. For example node LEFT aorta at the level the kidneys measures 7 mm  with SUV max equal 13.7. Larger node ventral to the aorta measures 14 mm with SUV max equal 18.2 on image 140. Liver: No liver metastasis. Incidental CT finding: None SKELETON There are widespread focal lesions with intense metabolic activity within the axillary and appendicular skeleton. Lesion too numerous to count and number over 100. Lesions are intensely radiotracer avid and accompanied by subtle sclerotic lesions. Example lesion in the posterior RIGHT acetabulum with SUV max equal 22. Lesion in the proximal LEFT femur SUV max equal 23.5. Lesion in the T9 vertebral body with SUV max equal 20.4. Lesions involve the shoulder girdles and calvarium. Example lesion in the clivus with SUV max equal 25.6 IMPRESSION: 1. Intense radiotracer activity with involving a broad region of the anterior and anterior LEFT prostate gland. Findings consistent primary prostate carcinoma. 2. Multiple small intensely radiotracer avid lymph nodes in the pelvis, periaortic retroperitoneum and MEDIASTINUM consistent with metastatic prostate cancer adenopathy. 3. Widespread focal radiotracer  avid prostate cancer skeletal metastasis involving the axillary and appendicular skeleton. Lesions number greater than 100. Electronically Signed   By: Suzy Bouchard M.D.   On: 12/10/2020 12:48    Labs:  CBC: No results for input(s): WBC, HGB, HCT, PLT in the last 8760 hours.  COAGS: No results for input(s): INR, APTT in the last 8760 hours.  BMP: No results for input(s): NA, K, CL, CO2, GLUCOSE, BUN, CALCIUM, CREATININE, GFRNONAA, GFRAA in the last 8760 hours.  Invalid input(s): CMP  LIVER FUNCTION TESTS: No results for input(s): BILITOT, AST, ALT, ALKPHOS, PROT, ALBUMIN in the last 8760 hours.  TUMOR MARKERS: No results for input(s): AFPTM, CEA, CA199, CHROMGRNA in the last 8760 hours.  Assessment and Plan:  Widespread metastatic prostate cancer.  Will proceed with image guided placement of a tunneled catheter with port today by Dr.Hassell.  Risks and benefits of image guided port-a-catheter placement was discussed with the patient including, but not limited to bleeding, infection, pneumothorax, or fibrin sheath development and need for additional procedures.  All of the patient's questions were answered, patient is agreeable to proceed. Consent signed and in chart.  Thank you for allowing our service to participate in CARL BUTNER 's care.  Electronically Signed: Murrell Redden, PA-C   01/05/2021, 10:34 AM      I spent a total of 20 Minutes    in face to face in clinical consultation, greater than 50% of which was counseling/coordinating care for [facility].

## 2021-01-09 ENCOUNTER — Inpatient Hospital Stay: Payer: PPO

## 2021-01-09 ENCOUNTER — Other Ambulatory Visit: Payer: Self-pay

## 2021-01-09 VITALS — BP 139/83 | HR 77 | Temp 98.7°F | Resp 17 | Wt 212.2 lb

## 2021-01-09 DIAGNOSIS — C61 Malignant neoplasm of prostate: Secondary | ICD-10-CM | POA: Diagnosis not present

## 2021-01-09 DIAGNOSIS — Z95828 Presence of other vascular implants and grafts: Secondary | ICD-10-CM | POA: Insufficient documentation

## 2021-01-09 DIAGNOSIS — Z5111 Encounter for antineoplastic chemotherapy: Secondary | ICD-10-CM | POA: Diagnosis not present

## 2021-01-09 LAB — CMP (CANCER CENTER ONLY)
ALT: 44 U/L (ref 0–44)
AST: 23 U/L (ref 15–41)
Albumin: 4 g/dL (ref 3.5–5.0)
Alkaline Phosphatase: 622 U/L — ABNORMAL HIGH (ref 38–126)
Anion gap: 8 (ref 5–15)
BUN: 22 mg/dL (ref 8–23)
CO2: 24 mmol/L (ref 22–32)
Calcium: 9 mg/dL (ref 8.9–10.3)
Chloride: 106 mmol/L (ref 98–111)
Creatinine: 0.69 mg/dL (ref 0.61–1.24)
GFR, Estimated: >60 mL/min (ref >60)
Glucose, Bld: 115 mg/dL — ABNORMAL HIGH (ref 70–99)
Potassium: 3.8 mmol/L (ref 3.5–5.1)
Sodium: 138 mmol/L (ref 135–145)
Total Bilirubin: 0.4 mg/dL (ref 0.3–1.2)
Total Protein: 7.3 g/dL (ref 6.5–8.1)

## 2021-01-09 LAB — CBC WITH DIFFERENTIAL (CANCER CENTER ONLY)
Abs Immature Granulocytes: 0.03 10*3/uL (ref 0.00–0.07)
Basophils Absolute: 0 10*3/uL (ref 0.0–0.1)
Basophils Relative: 0 %
Eosinophils Absolute: 0.1 10*3/uL (ref 0.0–0.5)
Eosinophils Relative: 2 %
HCT: 37.6 % — ABNORMAL LOW (ref 39.0–52.0)
Hemoglobin: 12.4 g/dL — ABNORMAL LOW (ref 13.0–17.0)
Immature Granulocytes: 0 %
Lymphocytes Relative: 25 %
Lymphs Abs: 1.8 10*3/uL (ref 0.7–4.0)
MCH: 30.5 pg (ref 26.0–34.0)
MCHC: 33 g/dL (ref 30.0–36.0)
MCV: 92.4 fL (ref 80.0–100.0)
Monocytes Absolute: 0.5 10*3/uL (ref 0.1–1.0)
Monocytes Relative: 7 %
Neutro Abs: 4.7 10*3/uL (ref 1.7–7.7)
Neutrophils Relative %: 66 %
Platelet Count: 176 10*3/uL (ref 150–400)
RBC: 4.07 MIL/uL — ABNORMAL LOW (ref 4.22–5.81)
RDW: 13.9 % (ref 11.5–15.5)
WBC Count: 7.2 10*3/uL (ref 4.0–10.5)
nRBC: 0 % (ref 0.0–0.2)

## 2021-01-09 MED ORDER — SODIUM CHLORIDE 0.9 % IV SOLN
10.0000 mg | Freq: Once | INTRAVENOUS | Status: AC
  Administered 2021-01-09: 10 mg via INTRAVENOUS
  Filled 2021-01-09: qty 10

## 2021-01-09 MED ORDER — HEPARIN SOD (PORK) LOCK FLUSH 100 UNIT/ML IV SOLN
500.0000 [IU] | Freq: Once | INTRAVENOUS | Status: AC | PRN
  Administered 2021-01-09: 500 [IU]

## 2021-01-09 MED ORDER — SODIUM CHLORIDE 0.9 % IV SOLN: Freq: Once | INTRAVENOUS | Status: AC

## 2021-01-09 MED ORDER — SODIUM CHLORIDE 0.9% FLUSH
10.0000 mL | Freq: Once | INTRAVENOUS | Status: AC
  Administered 2021-01-09: 10 mL

## 2021-01-09 MED ORDER — SODIUM CHLORIDE 0.9% FLUSH
10.0000 mL | INTRAVENOUS | Status: DC | PRN
  Administered 2021-01-09: 10 mL

## 2021-01-09 MED ORDER — SODIUM CHLORIDE 0.9 % IV SOLN
75.0000 mg/m2 | Freq: Once | INTRAVENOUS | Status: AC
  Administered 2021-01-09: 160 mg via INTRAVENOUS
  Filled 2021-01-09: qty 16

## 2021-01-09 NOTE — Patient Instructions (Signed)
[facility]COL[facility]  Discharge Instructions: °Thank you for choosing [facility] to provide your oncology and hematology care.  ° °If you have a lab appointment with the Cancer Center, please go directly to the Cancer Center and check in at the registration area. °  °Wear comfortable clothing and clothing appropriate for easy access to any Portacath or PICC line.  ° °We strive to give you quality time with your provider. You may need to reschedule your appointment if you arrive late (15 or more minutes).  Arriving late affects you and other patients whose appointments are after yours.  Also, if you miss three or more appointments without notifying the office, you may be dismissed from the clinic at the provider’s discretion.    °  °For prescription refill requests, have your pharmacy contact our office and allow 72 hours for refills to be completed.   ° °Today you received the following chemotherapy and/or immunotherapy agents: Docetaxel.    °  °To help prevent nausea and vomiting after your treatment, we encourage you to take your nausea medication as directed. ° °BELOW ARE SYMPTOMS THAT SHOULD BE REPORTED IMMEDIATELY: °*FEVER GREATER THAN 100.4 F (38 °C) OR HIGHER °*CHILLS OR SWEATING °*NAUSEA AND VOMITING THAT IS NOT CONTROLLED WITH YOUR NAUSEA MEDICATION °*UNUSUAL SHORTNESS OF BREATH °*UNUSUAL BRUISING OR BLEEDING °*URINARY PROBLEMS (pain or burning when urinating, or frequent urination) °*BOWEL PROBLEMS (unusual diarrhea, constipation, pain near the anus) °TENDERNESS IN MOUTH AND THROAT WITH OR WITHOUT PRESENCE OF ULCERS (sore throat, sores in mouth, or a toothache) °UNUSUAL RASH, SWELLING OR PAIN  °UNUSUAL VAGINAL DISCHARGE OR ITCHING  ° °Items with * indicate a potential emergency and should be followed up as soon as possible or go to the Emergency Department if any problems should occur. ° °Please show the CHEMOTHERAPY ALERT CARD or IMMUNOTHERAPY ALERT CARD at check-in to  the Emergency Department and triage nurse. ° °Should you have questions after your visit or need to cancel or reschedule your appointment, please contact [facility]  Dept: [phone]  and follow the prompts.  Office hours are 8:00 a.m. to 4:30 p.m. Monday - Friday. Please note that voicemails left after 4:00 p.m. may not be returned until the following business day.  We are closed weekends and major holidays. You have access to a nurse at all times for urgent questions. Please call the main number to the clinic Dept: [phone] and follow the prompts. ° ° °For any non-urgent questions, you may also contact your provider using MyChart. We now offer e-Visits for anyone 18 and older to request care online for non-urgent symptoms. For details visit [email or web address]. °  °Also download the MyChart app! Go to the app store, search "MyChart", open the app, select [facility], and log in with your MyChart username and password. ° °Due to Covid, a mask is required upon entering the hospital/clinic. If you do not have a mask, one will be given to you upon arrival. For doctor visits, patients may have 1 support person aged 18 or older with them. For treatment visits, patients cannot have anyone with them due to current Covid guidelines and our immunocompromised population.  ° °Docetaxel injection °What is this medication? °DOCETAXEL (doe se TAX el) is a chemotherapy drug. It targets fast dividing cells, like cancer cells, and causes these cells to die. This medicine is used to treat many types of cancers like breast cancer, certain stomach cancers, head and neck cancer,   lung cancer, and prostate cancer. °This medicine may be used for other purposes; ask your health care provider or pharmacist if you have questions. °COMMON BRAND NAME(S): Docefrez, Taxotere °What should I tell my care team before I take this medication? °They need to know if you have any of these conditions: °infection  (especially a virus infection such as chickenpox, cold sores, or herpes) °liver disease °low blood counts, like low white cell, platelet, or red cell counts °an unusual or allergic reaction to docetaxel, polysorbate 80, other chemotherapy agents, other medicines, foods, dyes, or preservatives °pregnant or trying to get pregnant °breast-feeding °How should I use this medication? °This drug is given as an infusion into a vein. It is administered in a hospital or clinic by a specially trained health care professional. °Talk to your pediatrician regarding the use of this medicine in children. Special care may be needed. °Overdosage: If you think you have taken too much of this medicine contact a poison control center or emergency room at once. °NOTE: This medicine is only for you. Do not share this medicine with others. °What if I miss a dose? °It is important not to miss your dose. Call your doctor or health care professional if you are unable to keep an appointment. °What may interact with this medication? °Do not take this medicine with any of the following medications: °live virus vaccines °This medicine may also interact with the following medications: °aprepitant °certain antibiotics like erythromycin or clarithromycin °certain antivirals for HIV or hepatitis °certain medicines for fungal infections like fluconazole, itraconazole, ketoconazole, posaconazole, or voriconazole °cimetidine °ciprofloxacin °conivaptan °cyclosporine °dronedarone °fluvoxamine °grapefruit juice °imatinib °verapamil °This list may not describe all possible interactions. Give your health care provider a list of all the medicines, herbs, non-prescription drugs, or dietary supplements you use. Also tell them if you smoke, drink alcohol, or use illegal drugs. Some items may interact with your medicine. °What should I watch for while using this medication? °Your condition will be monitored carefully while you are receiving this medicine. You  will need important blood work done while you are taking this medicine. °Call your doctor or health care professional for advice if you get a fever, chills or sore throat, or other symptoms of a cold or flu. Do not treat yourself. This drug decreases your body's ability to fight infections. Try to avoid being around people who are sick. °Some products may contain alcohol. Ask your health care professional if this medicine contains alcohol. Be sure to tell all health care professionals you are taking this medicine. Certain medicines, like metronidazole and disulfiram, can cause an unpleasant reaction when taken with alcohol. The reaction includes flushing, headache, nausea, vomiting, sweating, and increased thirst. The reaction can last from 30 minutes to several hours. °You may get drowsy or dizzy. Do not drive, use machinery, or do anything that needs mental alertness until you know how this medicine affects you. Do not stand or sit up quickly, especially if you are an older patient. This reduces the risk of dizzy or fainting spells. Alcohol may interfere with the effect of this medicine. °Talk to your health care professional about your risk of cancer. You may be more at risk for certain types of cancer if you take this medicine. °Do not become pregnant while taking this medicine or for 6 months after stopping it. Women should inform their doctor if they wish to become pregnant or think they might be pregnant. There is a potential for serious side effects to   an unborn child. Talk to your health care professional or pharmacist for more information. Do not breast-feed an infant while taking this medicine or for 1 week after stopping it. °Males who get this medicine must use a condom during sex with females who can get pregnant. If you get a woman pregnant, the baby could have birth defects. The baby could die before they are born. You will need to continue wearing a condom for 3 months after stopping the medicine.  Tell your health care provider right away if your partner becomes pregnant while you are taking this medicine. °This may interfere with the ability to father a child. You should talk to your doctor or health care professional if you are concerned about your fertility. °What side effects may I notice from receiving this medication? °Side effects that you should report to your doctor or health care professional as soon as possible: °allergic reactions like skin rash, itching or hives, swelling of the face, lips, or tongue °blurred vision °breathing problems °changes in vision °low blood counts - This drug may decrease the number of white blood cells, red blood cells and platelets. You may be at increased risk for infections and bleeding. °nausea and vomiting °pain, redness or irritation at site where injected °pain, tingling, numbness in the hands or feet °redness, blistering, peeling, or loosening of the skin, including inside the mouth °signs of decreased platelets or bleeding - bruising, pinpoint red spots on the skin, black, tarry stools, nosebleeds °signs of decreased red blood cells - unusually weak or tired, fainting spells, lightheadedness °signs of infection - fever or chills, cough, sore throat, pain or difficulty passing urine °swelling of the ankle, feet, hands °Side effects that usually do not require medical attention (report to your doctor or health care professional if they continue or are bothersome): °constipation °diarrhea °fingernail or toenail changes °hair loss °loss of appetite °mouth sores °muscle pain °This list may not describe all possible side effects. Call your doctor for medical advice about side effects. You may report side effects to FDA at 1-800-FDA-1088. °Where should I keep my medication? °This drug is given in a hospital or clinic and will not be stored at home. °NOTE: This sheet is a summary. It may not cover all possible information. If you have questions about this medicine, talk  to your doctor, pharmacist, or health care provider. °© 2022 [employer]/Gold Standard (2020-10-17 00:00:00) ° ° °

## 2021-01-10 ENCOUNTER — Other Ambulatory Visit (HOSPITAL_COMMUNITY): Payer: PPO

## 2021-01-10 ENCOUNTER — Ambulatory Visit (HOSPITAL_COMMUNITY): Payer: PPO

## 2021-01-10 LAB — PROSTATE-SPECIFIC AG, SERUM (LABCORP): Prostate Specific Ag, Serum: 57.1 ng/mL — ABNORMAL HIGH (ref 0.0–4.0)

## 2021-01-11 ENCOUNTER — Inpatient Hospital Stay: Payer: PPO | Attending: Oncology

## 2021-01-11 ENCOUNTER — Encounter: Payer: Self-pay | Admitting: Oncology

## 2021-01-11 ENCOUNTER — Other Ambulatory Visit: Payer: Self-pay

## 2021-01-11 VITALS — BP 137/62 | HR 91 | Temp 98.4°F | Resp 20

## 2021-01-11 DIAGNOSIS — C61 Malignant neoplasm of prostate: Secondary | ICD-10-CM | POA: Insufficient documentation

## 2021-01-11 DIAGNOSIS — Z23 Encounter for immunization: Secondary | ICD-10-CM | POA: Diagnosis not present

## 2021-01-11 DIAGNOSIS — Z5111 Encounter for antineoplastic chemotherapy: Secondary | ICD-10-CM | POA: Insufficient documentation

## 2021-01-11 DIAGNOSIS — Z5189 Encounter for other specified aftercare: Secondary | ICD-10-CM | POA: Insufficient documentation

## 2021-01-11 MED ORDER — PEGFILGRASTIM-CBQV 6 MG/0.6ML ~~LOC~~ SOSY
6.0000 mg | PREFILLED_SYRINGE | Freq: Once | SUBCUTANEOUS | Status: AC
  Administered 2021-01-11: 6 mg via SUBCUTANEOUS
  Filled 2021-01-11: qty 0.6

## 2021-01-11 NOTE — Patient Instructions (Signed)

## 2021-01-12 ENCOUNTER — Other Ambulatory Visit (HOSPITAL_COMMUNITY): Payer: PPO

## 2021-01-12 ENCOUNTER — Ambulatory Visit (HOSPITAL_COMMUNITY): Payer: PPO

## 2021-01-31 ENCOUNTER — Other Ambulatory Visit: Payer: Self-pay

## 2021-01-31 ENCOUNTER — Inpatient Hospital Stay: Payer: PPO

## 2021-01-31 ENCOUNTER — Inpatient Hospital Stay (HOSPITAL_BASED_OUTPATIENT_CLINIC_OR_DEPARTMENT_OTHER): Payer: PPO | Admitting: Oncology

## 2021-01-31 VITALS — BP 128/76 | HR 83 | Temp 97.8°F | Resp 17 | Ht 68.0 in | Wt 215.8 lb

## 2021-01-31 DIAGNOSIS — Z5111 Encounter for antineoplastic chemotherapy: Secondary | ICD-10-CM | POA: Diagnosis not present

## 2021-01-31 DIAGNOSIS — Z23 Encounter for immunization: Secondary | ICD-10-CM

## 2021-01-31 DIAGNOSIS — C61 Malignant neoplasm of prostate: Secondary | ICD-10-CM | POA: Diagnosis not present

## 2021-01-31 DIAGNOSIS — Z95828 Presence of other vascular implants and grafts: Secondary | ICD-10-CM

## 2021-01-31 LAB — CBC WITH DIFFERENTIAL (CANCER CENTER ONLY)
Abs Immature Granulocytes: 0.03 10*3/uL (ref 0.00–0.07)
Basophils Absolute: 0.1 10*3/uL (ref 0.0–0.1)
Basophils Relative: 1 %
Eosinophils Absolute: 0 10*3/uL (ref 0.0–0.5)
Eosinophils Relative: 0 %
HCT: 34.6 % — ABNORMAL LOW (ref 39.0–52.0)
Hemoglobin: 11.4 g/dL — ABNORMAL LOW (ref 13.0–17.0)
Immature Granulocytes: 0 %
Lymphocytes Relative: 21 %
Lymphs Abs: 1.5 10*3/uL (ref 0.7–4.0)
MCH: 30.6 pg (ref 26.0–34.0)
MCHC: 32.9 g/dL (ref 30.0–36.0)
MCV: 92.8 fL (ref 80.0–100.0)
Monocytes Absolute: 0.8 10*3/uL (ref 0.1–1.0)
Monocytes Relative: 11 %
Neutro Abs: 4.6 10*3/uL (ref 1.7–7.7)
Neutrophils Relative %: 67 %
Platelet Count: 284 10*3/uL (ref 150–400)
RBC: 3.73 MIL/uL — ABNORMAL LOW (ref 4.22–5.81)
RDW: 14.8 % (ref 11.5–15.5)
WBC Count: 7 10*3/uL (ref 4.0–10.5)
nRBC: 0 % (ref 0.0–0.2)

## 2021-01-31 LAB — CMP (CANCER CENTER ONLY)
ALT: 38 U/L (ref 0–44)
AST: 19 U/L (ref 15–41)
Albumin: 3.9 g/dL (ref 3.5–5.0)
Alkaline Phosphatase: 274 U/L — ABNORMAL HIGH (ref 38–126)
Anion gap: 6 (ref 5–15)
BUN: 35 mg/dL — ABNORMAL HIGH (ref 8–23)
CO2: 25 mmol/L (ref 22–32)
Calcium: 9.2 mg/dL (ref 8.9–10.3)
Chloride: 108 mmol/L (ref 98–111)
Creatinine: 0.94 mg/dL (ref 0.61–1.24)
GFR, Estimated: >60 mL/min (ref >60)
Glucose, Bld: 135 mg/dL — ABNORMAL HIGH (ref 70–99)
Potassium: 4.3 mmol/L (ref 3.5–5.1)
Sodium: 139 mmol/L (ref 135–145)
Total Bilirubin: 0.4 mg/dL (ref 0.3–1.2)
Total Protein: 6.7 g/dL (ref 6.5–8.1)

## 2021-01-31 MED ORDER — SODIUM CHLORIDE 0.9% FLUSH
10.0000 mL | INTRAVENOUS | Status: DC | PRN
  Administered 2021-01-31: 11:00:00 10 mL

## 2021-01-31 MED ORDER — SODIUM CHLORIDE 0.9 % IV SOLN
Freq: Once | INTRAVENOUS | Status: AC
Start: 2021-01-31 — End: 2021-01-31

## 2021-01-31 MED ORDER — SODIUM CHLORIDE 0.9% FLUSH
10.0000 mL | Freq: Once | INTRAVENOUS | Status: AC
  Administered 2021-01-31: 08:00:00 10 mL

## 2021-01-31 MED ORDER — HEPARIN SOD (PORK) LOCK FLUSH 100 UNIT/ML IV SOLN
500.0000 [IU] | Freq: Once | INTRAVENOUS | Status: AC | PRN
  Administered 2021-01-31: 11:00:00 500 [IU]

## 2021-01-31 MED ORDER — SODIUM CHLORIDE 0.9 % IV SOLN
75.0000 mg/m2 | Freq: Once | INTRAVENOUS | Status: AC
  Administered 2021-01-31: 10:00:00 160 mg via INTRAVENOUS
  Filled 2021-01-31: qty 16

## 2021-01-31 MED ORDER — DEXAMETHASONE SODIUM PHOSPHATE 100 MG/10ML IJ SOLN
10.0000 mg | Freq: Once | INTRAMUSCULAR | Status: AC
  Administered 2021-01-31: 09:00:00 10 mg via INTRAVENOUS
  Filled 2021-01-31: qty 10

## 2021-01-31 MED ORDER — INFLUENZA VAC A&B SA ADJ QUAD 0.5 ML IM PRSY
0.5000 mL | PREFILLED_SYRINGE | Freq: Once | INTRAMUSCULAR | Status: AC
  Administered 2021-01-31: 10:00:00 0.5 mL via INTRAMUSCULAR
  Filled 2021-01-31: qty 0.5

## 2021-01-31 NOTE — Patient Instructions (Signed)
[facility]   ?Discharge Instructions: ?Thank you for choosing [facility] to provide your oncology and hematology care.  ? ?If you have a lab appointment with the Cancer Center, please go directly to the Cancer Center and check in at the registration area. ?  ?Wear comfortable clothing and clothing appropriate for easy access to any Portacath or PICC line.  ? ?We strive to give you quality time with your provider. You may need to reschedule your appointment if you arrive late (15 or more minutes).  Arriving late affects you and other patients whose appointments are after yours.  Also, if you miss three or more appointments without notifying the office, you may be dismissed from the clinic at the provider?s discretion.    ?  ?For prescription refill requests, have your pharmacy contact our office and allow 72 hours for refills to be completed.   ? ?Today you received the following chemotherapy and/or immunotherapy agents: docetaxel    ?  ?To help prevent nausea and vomiting after your treatment, we encourage you to take your nausea medication as directed. ? ?BELOW ARE SYMPTOMS THAT SHOULD BE REPORTED IMMEDIATELY: ?*FEVER GREATER THAN 100.4 F (38 ?C) OR HIGHER ?*CHILLS OR SWEATING ?*NAUSEA AND VOMITING THAT IS NOT CONTROLLED WITH YOUR NAUSEA MEDICATION ?*UNUSUAL SHORTNESS OF BREATH ?*UNUSUAL BRUISING OR BLEEDING ?*URINARY PROBLEMS (pain or burning when urinating, or frequent urination) ?*BOWEL PROBLEMS (unusual diarrhea, constipation, pain near the anus) ?TENDERNESS IN MOUTH AND THROAT WITH OR WITHOUT PRESENCE OF ULCERS (sore throat, sores in mouth, or a toothache) ?UNUSUAL RASH, SWELLING OR PAIN  ?UNUSUAL VAGINAL DISCHARGE OR ITCHING  ? ?Items with * indicate a potential emergency and should be followed up as soon as possible or go to the Emergency Department if any problems should occur. ? ?Please show the CHEMOTHERAPY ALERT CARD or IMMUNOTHERAPY ALERT CARD at check-in  to the Emergency Department and triage nurse. ? ?Should you have questions after your visit or need to cancel or reschedule your appointment, please contact [facility]  Dept: [phone]  and follow the prompts.  Office hours are 8:00 a.m. to 4:30 p.m. Monday - Friday. Please note that voicemails left after 4:00 p.m. may not be returned until the following business day.  We are closed weekends and major holidays. You have access to a nurse at all times for urgent questions. Please call the main number to the clinic Dept: [phone] and follow the prompts. ? ? ?For any non-urgent questions, you may also contact your provider using MyChart. We now offer e-Visits for anyone 18 and older to request care online for non-urgent symptoms. For details visit [email or web address]. ?  ?Also download the MyChart app! Go to the app store, search "MyChart", open the app, select [facility], and log in with your MyChart username and password. ? ?Due to Covid, a mask is required upon entering the hospital/clinic. If you do not have a mask, one will be given to you upon arrival. For doctor visits, patients may have 1 support person aged 18 or older with them. For treatment visits, patients cannot have anyone with them due to current Covid guidelines and our immunocompromised population.  ? ?

## 2021-01-31 NOTE — Progress Notes (Signed)
Hematology and Oncology Follow Up Visit  Stephen Collins [id number] 12-18-1945 75 y.o. 01/31/2021 8:06 AM [name], [name] Ready, MDAvva, [name], MD   Principle Diagnosis: 75 year old with castration-sensitive advanced prostate cancer with disease to the bone and lymphadenopathy noted in October 2022.  He initially presented with Gleason score 08/01/2019 PSA 14.   Prior Therapy: He was on active surveillance and did not receive any additional treatment.  His PSA increased to 83.2 in September 2022 and a PET scan showed diffuse involvement.  Repeat prostate biopsy on November 27, 2020 showed a Gleason score 4+5 = 9.  Current therapy:  Androgen deprivation therapy started with Mills Koller under the care of Dr. Louis Meckel.  He will receive Eligard 30 mg on February 02, 2021.  Taxotere chemotherapy 75 mg per metered squared started on January 09, 2021.  He is here for cycle 2 of therapy to complete 6 cycles.  Interim History: Stephen Collins returns today for a follow-up visit.  Since the last visit, he completed the first cycle of chemotherapy without any major complaints.  He denies any nausea, vomiting or abdominal pain.  He denies any hospitalizations or illnesses.  He denies any worsening neuropathy or excessive fatigue.  His performance status quality of life remains excellent.     Medications: I have reviewed the patient's current medications.  Current Outpatient Medications  Medication Sig Dispense Refill   buPROPion (WELLBUTRIN XL) 150 MG 24 hr tablet Take 150 mg by mouth daily.     CALCIUM PO Take 1,200 mg by mouth daily.     cetirizine (ZYRTEC) 10 MG tablet Take 10 mg by mouth daily as needed for allergies.     cholecalciferol (VITAMIN D3) 25 MCG (1000 UNIT) tablet Take 1,000 Units by mouth daily.     irbesartan-hydrochlorothiazide (AVALIDE) 300-12.5 MG tablet Take 1 tablet by mouth every morning.      lidocaine-prilocaine (EMLA) cream Apply 1 application topically as needed. 30 g 0    Multiple Vitamins-Minerals (ADULT GUMMY PO) Take 2 capsules by mouth daily.     naproxen sodium (ALEVE) 220 MG tablet Take 220 mg by mouth daily as needed (pain).     prochlorperazine (COMPAZINE) 10 MG tablet TAKE 1 TABLET(10 MG) BY MOUTH EVERY 6 HOURS AS NEEDED FOR NAUSEA OR VOMITING 30 tablet 0   rosuvastatin (CRESTOR) 20 MG tablet Take 20 mg by mouth daily.     No current facility-administered medications for this visit.   Facility-Administered Medications Ordered in Other Visits  Medication Dose Route Frequency Provider Last Rate Last Admin   sodium chloride flush (NS) 0.9 % injection 10 mL  10 mL Intracatheter Once [name], MD           Physical Exam: Blood pressure 128/76, pulse 83, temperature 97.8 F (36.6 C), temperature source Temporal, resp. rate 17, height 5\' 8"  (1.727 m), weight 215 lb 12.8 oz (97.9 kg), SpO2 100 %.  ECOG: 1   General appearance: Comfortable appearing without any discomfort Head: Normocephalic without any trauma Oropharynx: Mucous membranes are moist and pink without any thrush or ulcers. Eyes: Pupils are equal and round reactive to light. Lymph nodes: No cervical, supraclavicular, inguinal or axillary lymphadenopathy.   Heart:regular rate and rhythm.  S1 and S2 without leg edema. Lung: Clear without any rhonchi or wheezes.  No dullness to percussion. Abdomin: Soft, nontender, nondistended with good bowel sounds.  No hepatosplenomegaly. Musculoskeletal: No joint deformity or effusion.  Full range of motion noted. Neurological: No deficits noted on motor, sensory  and deep tendon reflex exam. Skin: No petechial rash or dryness.  Appeared moist.     Lab Results: Lab Results  Component Value Date   WBC 7.2 01/09/2021   HGB 12.4 (L) 01/09/2021   HCT 37.6 (L) 01/09/2021   MCV 92.4 01/09/2021   PLT 176 01/09/2021     Chemistry      Component Value Date/Time   NA 138 01/09/2021 1310   K 3.8 01/09/2021 1310   CL 106 01/09/2021 1310   CO2  24 01/09/2021 1310   BUN 22 01/09/2021 1310   CREATININE 0.69 01/09/2021 1310      Component Value Date/Time   CALCIUM 9.0 01/09/2021 1310   ALKPHOS 622 (H) 01/09/2021 1310   AST 23 01/09/2021 1310   ALT 44 01/09/2021 1310   BILITOT 0.4 01/09/2021 1310        Impression and Plan:  75 year old with:  1.  Castration-sensitive advanced prostate cancer documented in October 2022 with bone and lymphadenopathy.       He is currently on Taxotere chemotherapy and completed the first cycle.  Risks and benefits of proceeding with cycle 2 reviewed today.  Potential complications include nausea, vomiting, myelosuppression, neutropenia and possible sepsis were reiterated.  The role for therapy escalation with androgen receptor pathway inhibitors were reviewed at this time which will be considered in the future.  He is agreeable to proceed with chemotherapy at this time.     2.  IV access: Port-A-Cath inserted and currently in use without any issues.   3.  Antiemetics: No nausea or vomiting reported at this time.  Compazine is available to him.   4.  Androgen deprivation therapy: The will receive Eligard with his growth factor support in the near future.  Complications that include weight gain, hot flashes among others were reiterated.   5.  Goals of care: His disease is incurable although aggressive measures are warranted at this time.   6.  Bone directed therapy: Delton See will be considered after obtaining dental clearance.  For the time being I recommended calcium and vitamin D supplements.   7.  Growth factor support: He is at risk of neutropenia and possible sepsis and will add Protonix 40 each cycle.   8.  Follow-up: In 3 weeks for the next cycle of therapy.    30  minutes were spent on this encounter.  The time was dedicated to reviewing disease status, addressing complications related to cancer, cancer therapy and future plan of care discussion.   Zola Button, MD 12/21/20228:06  AM

## 2021-02-01 ENCOUNTER — Telehealth: Payer: Self-pay | Admitting: *Deleted

## 2021-02-01 LAB — PROSTATE-SPECIFIC AG, SERUM (LABCORP): Prostate Specific Ag, Serum: 19.4 ng/mL — ABNORMAL HIGH (ref 0.0–4.0)

## 2021-02-01 NOTE — Telephone Encounter (Signed)
Left message to call Dr Hazeline Junker office

## 2021-02-01 NOTE — Telephone Encounter (Signed)
-----   Message from [name], MD sent at 02/01/2021  8:43 AM EST ----- Please let him know his PSA is down

## 2021-02-02 ENCOUNTER — Inpatient Hospital Stay: Payer: PPO

## 2021-02-02 ENCOUNTER — Other Ambulatory Visit: Payer: Self-pay

## 2021-02-02 VITALS — BP 136/66 | HR 80 | Temp 98.6°F | Resp 20

## 2021-02-02 DIAGNOSIS — C61 Malignant neoplasm of prostate: Secondary | ICD-10-CM | POA: Diagnosis not present

## 2021-02-02 DIAGNOSIS — Z5111 Encounter for antineoplastic chemotherapy: Secondary | ICD-10-CM | POA: Diagnosis not present

## 2021-02-02 DIAGNOSIS — Z95828 Presence of other vascular implants and grafts: Secondary | ICD-10-CM

## 2021-02-02 MED ORDER — LEUPROLIDE ACETATE (4 MONTH) 30 MG IM KIT
30.0000 mg | PACK | Freq: Once | INTRAMUSCULAR | Status: DC
  Filled 2021-02-02: qty 30

## 2021-02-02 MED ORDER — PEGFILGRASTIM-CBQV 6 MG/0.6ML ~~LOC~~ SOSY
6.0000 mg | PREFILLED_SYRINGE | Freq: Once | SUBCUTANEOUS | Status: AC
  Administered 2021-02-02: 14:00:00 6 mg via SUBCUTANEOUS
  Filled 2021-02-02: qty 0.6

## 2021-02-02 MED ORDER — LEUPROLIDE ACETATE (3 MONTH) 22.5 MG IM KIT
22.5000 mg | PACK | Freq: Once | INTRAMUSCULAR | Status: AC
  Administered 2021-02-02: 14:00:00 22.5 mg via INTRAMUSCULAR
  Filled 2021-02-02: qty 22.5

## 2021-02-02 MED ORDER — LEUPROLIDE ACETATE 7.5 MG IM KIT
7.5000 mg | PACK | Freq: Once | INTRAMUSCULAR | Status: AC
  Administered 2021-02-02: 14:00:00 7.5 mg via INTRAMUSCULAR
  Filled 2021-02-02: qty 7.5

## 2021-02-02 NOTE — Patient Instructions (Signed)

## 2021-02-19 MED FILL — Dexamethasone Sodium Phosphate Inj 100 MG/10ML: INTRAMUSCULAR | Qty: 1 | Status: AC

## 2021-02-20 ENCOUNTER — Inpatient Hospital Stay: Payer: PPO

## 2021-02-20 ENCOUNTER — Inpatient Hospital Stay: Payer: PPO | Attending: Oncology

## 2021-02-20 ENCOUNTER — Other Ambulatory Visit: Payer: Self-pay

## 2021-02-20 ENCOUNTER — Inpatient Hospital Stay (HOSPITAL_BASED_OUTPATIENT_CLINIC_OR_DEPARTMENT_OTHER): Payer: PPO | Admitting: Oncology

## 2021-02-20 ENCOUNTER — Encounter: Payer: Self-pay | Admitting: Oncology

## 2021-02-20 VITALS — BP 127/68 | HR 75 | Temp 98.1°F | Resp 17 | Ht 68.0 in | Wt 219.8 lb

## 2021-02-20 DIAGNOSIS — C61 Malignant neoplasm of prostate: Secondary | ICD-10-CM | POA: Diagnosis not present

## 2021-02-20 DIAGNOSIS — Z95828 Presence of other vascular implants and grafts: Secondary | ICD-10-CM

## 2021-02-20 DIAGNOSIS — Z5189 Encounter for other specified aftercare: Secondary | ICD-10-CM | POA: Insufficient documentation

## 2021-02-20 LAB — CBC WITH DIFFERENTIAL (CANCER CENTER ONLY)
Abs Immature Granulocytes: 0.03 10*3/uL (ref 0.00–0.07)
Basophils Absolute: 0.1 10*3/uL (ref 0.0–0.1)
Basophils Relative: 1 %
Eosinophils Absolute: 0 10*3/uL (ref 0.0–0.5)
Eosinophils Relative: 0 %
HCT: 31.4 % — ABNORMAL LOW (ref 39.0–52.0)
Hemoglobin: 10.2 g/dL — ABNORMAL LOW (ref 13.0–17.0)
Immature Granulocytes: 1 %
Lymphocytes Relative: 22 %
Lymphs Abs: 1.3 10*3/uL (ref 0.7–4.0)
MCH: 30.3 pg (ref 26.0–34.0)
MCHC: 32.5 g/dL (ref 30.0–36.0)
MCV: 93.2 fL (ref 80.0–100.0)
Monocytes Absolute: 0.7 10*3/uL (ref 0.1–1.0)
Monocytes Relative: 11 %
Neutro Abs: 3.8 10*3/uL (ref 1.7–7.7)
Neutrophils Relative %: 65 %
Platelet Count: 281 10*3/uL (ref 150–400)
RBC: 3.37 MIL/uL — ABNORMAL LOW (ref 4.22–5.81)
RDW: 15.9 % — ABNORMAL HIGH (ref 11.5–15.5)
WBC Count: 5.9 10*3/uL (ref 4.0–10.5)
nRBC: 0 % (ref 0.0–0.2)

## 2021-02-20 LAB — CMP (CANCER CENTER ONLY)
ALT: 45 U/L — ABNORMAL HIGH (ref 0–44)
AST: 20 U/L (ref 15–41)
Albumin: 3.8 g/dL (ref 3.5–5.0)
Alkaline Phosphatase: 152 U/L — ABNORMAL HIGH (ref 38–126)
Anion gap: 6 (ref 5–15)
BUN: 26 mg/dL — ABNORMAL HIGH (ref 8–23)
CO2: 25 mmol/L (ref 22–32)
Calcium: 9.2 mg/dL (ref 8.9–10.3)
Chloride: 107 mmol/L (ref 98–111)
Creatinine: 0.77 mg/dL (ref 0.61–1.24)
GFR, Estimated: >60 mL/min (ref >60)
Glucose, Bld: 153 mg/dL — ABNORMAL HIGH (ref 70–99)
Potassium: 4.2 mmol/L (ref 3.5–5.1)
Sodium: 138 mmol/L (ref 135–145)
Total Bilirubin: 0.4 mg/dL (ref 0.3–1.2)
Total Protein: 6.4 g/dL — ABNORMAL LOW (ref 6.5–8.1)

## 2021-02-20 MED ORDER — SODIUM CHLORIDE 0.9% FLUSH
10.0000 mL | Freq: Once | INTRAVENOUS | Status: AC
  Administered 2021-02-20: 10 mL

## 2021-02-20 MED ORDER — SODIUM CHLORIDE 0.9 % IV SOLN
10.0000 mg | Freq: Once | INTRAVENOUS | Status: AC
  Administered 2021-02-20: 10 mg via INTRAVENOUS
  Filled 2021-02-20: qty 10

## 2021-02-20 MED ORDER — SODIUM CHLORIDE 0.9 % IV SOLN
75.0000 mg/m2 | Freq: Once | INTRAVENOUS | Status: AC
  Administered 2021-02-20: 160 mg via INTRAVENOUS
  Filled 2021-02-20: qty 16

## 2021-02-20 MED ORDER — SODIUM CHLORIDE 0.9 % IV SOLN: Freq: Once | INTRAVENOUS | Status: AC

## 2021-02-20 NOTE — Progress Notes (Signed)
Hematology and Oncology Follow Up Visit  Stephen Collins [id number] 1945/05/02 76 y.o. 02/20/2021 8:08 AM Stephen Collins, MDAvva, [name], MD   Principle Diagnosis: 76 year old with advanced prostate cancer with disease to the bone and lymphadenopathy diagnosed in October 2022.  He has castration-sensitive after initial presentation in 2021 with Gleason score of 6.   Prior Therapy: He was on active surveillance and did not receive any additional treatment.  His PSA increased to 83.2 in September 2022 and a PET scan showed diffuse involvement.  Repeat prostate biopsy on November 27, 2020 showed a Gleason score 4+5 = 9.  Current therapy:  Eligard 22.5 mg every 3 months started on February 02, 2021.  Taxotere chemotherapy 75 mg per metered squared started on January 09, 2021.  He is here for cycle 3 of therapy for a total of 6.  Interim History: Stephen Collins is here for return evaluation.  Since this last visit he reports no major complaints at this time.  He has tolerated chemotherapy without any nausea, vomiting or abdominal pain.  He denies any worsening neuropathy or excessive fatigue.  He denies any worsening diarrhea.    Medications: Updated on review. Current Outpatient Medications  Medication Sig Dispense Refill   buPROPion (WELLBUTRIN XL) 150 MG 24 hr tablet Take 150 mg by mouth daily.     CALCIUM PO Take 1,200 mg by mouth daily.     cetirizine (ZYRTEC) 10 MG tablet Take 10 mg by mouth daily as needed for allergies.     cholecalciferol (VITAMIN D3) 25 MCG (1000 UNIT) tablet Take 1,000 Units by mouth daily.     irbesartan-hydrochlorothiazide (AVALIDE) 300-12.5 MG tablet Take 1 tablet by mouth every morning.      lidocaine-prilocaine (EMLA) cream Apply 1 application topically as needed. 30 g 0   Multiple Vitamins-Minerals (ADULT GUMMY PO) Take 2 capsules by mouth daily.     naproxen sodium (ALEVE) 220 MG tablet Take 220 mg by mouth daily as needed (pain).     prochlorperazine  (COMPAZINE) 10 MG tablet TAKE 1 TABLET(10 MG) BY MOUTH EVERY 6 HOURS AS NEEDED FOR NAUSEA OR VOMITING 30 tablet 0   rosuvastatin (CRESTOR) 20 MG tablet Take 20 mg by mouth daily.     No current facility-administered medications for this visit.       Physical Exam:  Blood pressure 127/68, pulse 75, temperature 98.1 F (36.7 C), temperature source Axillary, resp. rate 17, height 5\' 8"  (1.727 m), weight 219 lb 12.8 oz (99.7 kg), SpO2 98 %.  ECOG: 1    General appearance: Alert, awake without any distress. Head: Atraumatic without abnormalities Oropharynx: Without any thrush or ulcers. Eyes: No scleral icterus. Lymph nodes: No lymphadenopathy noted in the cervical, supraclavicular, or axillary nodes Heart:regular rate and rhythm, without any murmurs or gallops.   Lung: Clear to auscultation without any rhonchi, wheezes or dullness to percussion. Abdomin: Soft, nontender without any shifting dullness or ascites. Musculoskeletal: No clubbing or cyanosis. Neurological: No motor or sensory deficits.     Lab Results: Lab Results  Component Value Date   WBC 7.0 01/31/2021   HGB 11.4 (L) 01/31/2021   HCT 34.6 (L) 01/31/2021   MCV 92.8 01/31/2021   PLT 284 01/31/2021     Chemistry      Component Value Date/Time   NA 139 01/31/2021 0809   K 4.3 01/31/2021 0809   CL 108 01/31/2021 0809   CO2 25 01/31/2021 0809   BUN 35 (H) 01/31/2021 0809   CREATININE  0.94 01/31/2021 0809      Component Value Date/Time   CALCIUM 9.2 01/31/2021 0809   ALKPHOS 274 (H) 01/31/2021 0809   AST 19 01/31/2021 0809   ALT 38 01/31/2021 0809   BILITOT 0.4 01/31/2021 0809        Impression and Plan:  76 year old with:  1.  Advanced prostate cancer with disease to the bone and lymphadenopathy diagnosed in October 2022.  He has castration-sensitive disease.     His disease status was updated at this time and treatment choices were reviewed.  He is currently on Taxotere chemotherapy without  any major complaints.  Complications such as nausea, vomiting, myelosuppression, neutropenia and possible sepsis were reiterated.  He is agreeable to proceed and the plan is to complete 6 cycles of therapy.     2.  IV access: Port-A-Cath remains in use without any issues.   3.  Antiemetics: Compazine is available to her without any nausea or vomiting.   4.  Androgen deprivation therapy: He will receive Eligard again in March 2023.   5.  Goals of care: Therapy remains palliative although aggressive measures are warranted at this time.   6.  Bone directed therapy: He is to continue calcium and vitamin D supplements.  Delton See has been deferred till dental clearance is complete.   7.  Growth factor support: Ellen Henri will be given after each cycle of therapy given his risk of neutropenia.   8.  Follow-up: He will return in 3 weeks for the next cycle of therapy.    30  minutes were dedicated to this visit.  Time spent on reviewing laboratory data, disease status update and outlining future plan of care review.   Zola Button, MD 1/10/20238:08 AM

## 2021-02-20 NOTE — Patient Instructions (Signed)
[facility]   ?Discharge Instructions: ?Thank you for choosing [facility] to provide your oncology and hematology care.  ? ?If you have a lab appointment with the Cancer Center, please go directly to the Cancer Center and check in at the registration area. ?  ?Wear comfortable clothing and clothing appropriate for easy access to any Portacath or PICC line.  ? ?We strive to give you quality time with your provider. You may need to reschedule your appointment if you arrive late (15 or more minutes).  Arriving late affects you and other patients whose appointments are after yours.  Also, if you miss three or more appointments without notifying the office, you may be dismissed from the clinic at the provider?s discretion.    ?  ?For prescription refill requests, have your pharmacy contact our office and allow 72 hours for refills to be completed.   ? ?Today you received the following chemotherapy and/or immunotherapy agents: docetaxel    ?  ?To help prevent nausea and vomiting after your treatment, we encourage you to take your nausea medication as directed. ? ?BELOW ARE SYMPTOMS THAT SHOULD BE REPORTED IMMEDIATELY: ?*FEVER GREATER THAN 100.4 F (38 ?C) OR HIGHER ?*CHILLS OR SWEATING ?*NAUSEA AND VOMITING THAT IS NOT CONTROLLED WITH YOUR NAUSEA MEDICATION ?*UNUSUAL SHORTNESS OF BREATH ?*UNUSUAL BRUISING OR BLEEDING ?*URINARY PROBLEMS (pain or burning when urinating, or frequent urination) ?*BOWEL PROBLEMS (unusual diarrhea, constipation, pain near the anus) ?TENDERNESS IN MOUTH AND THROAT WITH OR WITHOUT PRESENCE OF ULCERS (sore throat, sores in mouth, or a toothache) ?UNUSUAL RASH, SWELLING OR PAIN  ?UNUSUAL VAGINAL DISCHARGE OR ITCHING  ? ?Items with * indicate a potential emergency and should be followed up as soon as possible or go to the Emergency Department if any problems should occur. ? ?Please show the CHEMOTHERAPY ALERT CARD or IMMUNOTHERAPY ALERT CARD at check-in  to the Emergency Department and triage nurse. ? ?Should you have questions after your visit or need to cancel or reschedule your appointment, please contact [facility]  Dept: [phone]  and follow the prompts.  Office hours are 8:00 a.m. to 4:30 p.m. Monday - Friday. Please note that voicemails left after 4:00 p.m. may not be returned until the following business day.  We are closed weekends and major holidays. You have access to a nurse at all times for urgent questions. Please call the main number to the clinic Dept: [phone] and follow the prompts. ? ? ?For any non-urgent questions, you may also contact your provider using MyChart. We now offer e-Visits for anyone 18 and older to request care online for non-urgent symptoms. For details visit [email or web address]. ?  ?Also download the MyChart app! Go to the app store, search "MyChart", open the app, select [facility], and log in with your MyChart username and password. ? ?Due to Covid, a mask is required upon entering the hospital/clinic. If you do not have a mask, one will be given to you upon arrival. For doctor visits, patients may have 1 support person aged 18 or older with them. For treatment visits, patients cannot have anyone with them due to current Covid guidelines and our immunocompromised population.  ? ?

## 2021-02-21 ENCOUNTER — Telehealth: Payer: Self-pay | Admitting: *Deleted

## 2021-02-21 ENCOUNTER — Encounter: Payer: Self-pay | Admitting: Oncology

## 2021-02-21 LAB — PROSTATE-SPECIFIC AG, SERUM (LABCORP): Prostate Specific Ag, Serum: 10.8 ng/mL — ABNORMAL HIGH (ref 0.0–4.0)

## 2021-02-21 NOTE — Telephone Encounter (Signed)
PC to patient, no answer, left VM - informed him his PSA is 10.8   Instructed patient to call this office with any questions or concerns, phone number given.

## 2021-02-21 NOTE — Telephone Encounter (Signed)
-----   Message from [name], MD sent at 02/21/2021  8:58 AM EST ----- Please let him know his PSA is down.

## 2021-02-22 ENCOUNTER — Other Ambulatory Visit: Payer: Self-pay

## 2021-02-22 ENCOUNTER — Inpatient Hospital Stay: Payer: PPO

## 2021-02-22 VITALS — BP 131/67 | HR 88 | Temp 98.5°F | Resp 18

## 2021-02-22 DIAGNOSIS — C61 Malignant neoplasm of prostate: Secondary | ICD-10-CM | POA: Diagnosis not present

## 2021-02-22 MED ORDER — PEGFILGRASTIM-CBQV 6 MG/0.6ML ~~LOC~~ SOSY
6.0000 mg | PREFILLED_SYRINGE | Freq: Once | SUBCUTANEOUS | Status: AC
  Administered 2021-02-22: 6 mg via SUBCUTANEOUS
  Filled 2021-02-22: qty 0.6

## 2021-02-22 NOTE — Patient Instructions (Signed)

## 2021-03-13 MED FILL — Dexamethasone Sodium Phosphate Inj 100 MG/10ML: INTRAMUSCULAR | Qty: 1 | Status: AC

## 2021-03-14 ENCOUNTER — Inpatient Hospital Stay: Payer: PPO | Attending: Oncology

## 2021-03-14 ENCOUNTER — Inpatient Hospital Stay (HOSPITAL_BASED_OUTPATIENT_CLINIC_OR_DEPARTMENT_OTHER): Payer: PPO | Admitting: Oncology

## 2021-03-14 ENCOUNTER — Other Ambulatory Visit: Payer: Self-pay

## 2021-03-14 ENCOUNTER — Inpatient Hospital Stay: Payer: PPO

## 2021-03-14 VITALS — BP 145/79 | HR 89 | Temp 97.6°F | Resp 20 | Ht 68.0 in | Wt 220.0 lb

## 2021-03-14 DIAGNOSIS — Z5189 Encounter for other specified aftercare: Secondary | ICD-10-CM | POA: Insufficient documentation

## 2021-03-14 DIAGNOSIS — Z95828 Presence of other vascular implants and grafts: Secondary | ICD-10-CM

## 2021-03-14 DIAGNOSIS — Z5111 Encounter for antineoplastic chemotherapy: Secondary | ICD-10-CM | POA: Diagnosis not present

## 2021-03-14 DIAGNOSIS — C61 Malignant neoplasm of prostate: Secondary | ICD-10-CM | POA: Diagnosis not present

## 2021-03-14 LAB — CMP (CANCER CENTER ONLY)
ALT: 29 U/L (ref 0–44)
AST: 17 U/L (ref 15–41)
Albumin: 4.1 g/dL (ref 3.5–5.0)
Alkaline Phosphatase: 134 U/L — ABNORMAL HIGH (ref 38–126)
Anion gap: 6 (ref 5–15)
BUN: 21 mg/dL (ref 8–23)
CO2: 27 mmol/L (ref 22–32)
Calcium: 9.4 mg/dL (ref 8.9–10.3)
Chloride: 106 mmol/L (ref 98–111)
Creatinine: 0.77 mg/dL (ref 0.61–1.24)
GFR, Estimated: >60 mL/min (ref >60)
Glucose, Bld: 114 mg/dL — ABNORMAL HIGH (ref 70–99)
Potassium: 4.1 mmol/L (ref 3.5–5.1)
Sodium: 139 mmol/L (ref 135–145)
Total Bilirubin: 0.6 mg/dL (ref 0.3–1.2)
Total Protein: 6.8 g/dL (ref 6.5–8.1)

## 2021-03-14 LAB — CBC WITH DIFFERENTIAL (CANCER CENTER ONLY)
Abs Immature Granulocytes: 0.03 10*3/uL (ref 0.00–0.07)
Basophils Absolute: 0 10*3/uL (ref 0.0–0.1)
Basophils Relative: 1 %
Eosinophils Absolute: 0 10*3/uL (ref 0.0–0.5)
Eosinophils Relative: 0 %
HCT: 31.2 % — ABNORMAL LOW (ref 39.0–52.0)
Hemoglobin: 10.1 g/dL — ABNORMAL LOW (ref 13.0–17.0)
Immature Granulocytes: 1 %
Lymphocytes Relative: 17 %
Lymphs Abs: 1 10*3/uL (ref 0.7–4.0)
MCH: 30.5 pg (ref 26.0–34.0)
MCHC: 32.4 g/dL (ref 30.0–36.0)
MCV: 94.3 fL (ref 80.0–100.0)
Monocytes Absolute: 0.6 10*3/uL (ref 0.1–1.0)
Monocytes Relative: 11 %
Neutro Abs: 4.1 10*3/uL (ref 1.7–7.7)
Neutrophils Relative %: 70 %
Platelet Count: 253 10*3/uL (ref 150–400)
RBC: 3.31 MIL/uL — ABNORMAL LOW (ref 4.22–5.81)
RDW: 17.7 % — ABNORMAL HIGH (ref 11.5–15.5)
WBC Count: 5.7 10*3/uL (ref 4.0–10.5)
nRBC: 0 % (ref 0.0–0.2)

## 2021-03-14 MED ORDER — SODIUM CHLORIDE 0.9% FLUSH
10.0000 mL | INTRAVENOUS | Status: DC | PRN
  Administered 2021-03-14: 10 mL

## 2021-03-14 MED ORDER — SODIUM CHLORIDE 0.9 % IV SOLN
10.0000 mg | Freq: Once | INTRAVENOUS | Status: AC
  Administered 2021-03-14: 10 mg via INTRAVENOUS
  Filled 2021-03-14: qty 10

## 2021-03-14 MED ORDER — SODIUM CHLORIDE 0.9% FLUSH
10.0000 mL | Freq: Once | INTRAVENOUS | Status: AC
  Administered 2021-03-14: 10 mL

## 2021-03-14 MED ORDER — HEPARIN SOD (PORK) LOCK FLUSH 100 UNIT/ML IV SOLN
500.0000 [IU] | Freq: Once | INTRAVENOUS | Status: AC | PRN
  Administered 2021-03-14: 500 [IU]

## 2021-03-14 MED ORDER — SODIUM CHLORIDE 0.9 % IV SOLN
60.0000 mg/m2 | Freq: Once | INTRAVENOUS | Status: AC
  Administered 2021-03-14: 130 mg via INTRAVENOUS
  Filled 2021-03-14: qty 13

## 2021-03-14 MED ORDER — SODIUM CHLORIDE 0.9 % IV SOLN: Freq: Once | INTRAVENOUS | Status: AC

## 2021-03-14 NOTE — Progress Notes (Signed)
Hematology and Oncology Follow Up Visit  Stephen Collins [id number] [birth year] 76 y.o. 03/14/2021 11:31 AM [name], [name] Ready, MDAvva, [name], MD   Principle Diagnosis: 76 year old with castration-sensitive advanced prostate cancer with disease to the bone and lymphadenopathy diagnosed in October 2022.  He was initially diagnosed 2021 with Gleason score of 6 localized prostate cancer.   Prior Therapy: He was on active surveillance and did not receive any additional treatment.  His PSA increased to 83.2 in September 2022 and a PET scan showed diffuse involvement.  Repeat prostate biopsy on November 27, 2020 showed a Gleason score 4+5 = 9.  Current therapy:  Eligard 22.5 mg every 3 months started on February 02, 2021.   Taxotere chemotherapy 75 mg per metered squared started on January 09, 2021.  He is here for cycle 4 of therapy for a total of 6.  Interim History: Stephen Collins returns today for a follow-up visit.  Since the last visit, he reports no major changes in his health.  He denies any recent hospitalizations or illnesses.  He denies any nausea, vomiting or abdominal pain.  He has reported some mild fatigue and tiredness associated with chemotherapy.  He remains active but has been limited as of late.    Medications: Reviewed without changes. Current Outpatient Medications  Medication Sig Dispense Refill   buPROPion (WELLBUTRIN XL) 150 MG 24 hr tablet Take 150 mg by mouth daily.     CALCIUM PO Take 1,200 mg by mouth daily.     cetirizine (ZYRTEC) 10 MG tablet Take 10 mg by mouth daily as needed for allergies.     cholecalciferol (VITAMIN D3) 25 MCG (1000 UNIT) tablet Take 1,000 Units by mouth daily.     irbesartan-hydrochlorothiazide (AVALIDE) 300-12.5 MG tablet Take 1 tablet by mouth every morning.      lidocaine-prilocaine (EMLA) cream Apply 1 application topically as needed. 30 g 0   Multiple Vitamins-Minerals (ADULT GUMMY PO) Take 2 capsules by mouth daily.     naproxen  sodium (ALEVE) 220 MG tablet Take 220 mg by mouth daily as needed (pain).     prochlorperazine (COMPAZINE) 10 MG tablet TAKE 1 TABLET(10 MG) BY MOUTH EVERY 6 HOURS AS NEEDED FOR NAUSEA OR VOMITING 30 tablet 0   rosuvastatin (CRESTOR) 20 MG tablet Take 20 mg by mouth daily.     No current facility-administered medications for this visit.       Physical Exam:  Blood pressure (!) 145/79, pulse 89, temperature 97.6 F (36.4 C), temperature source Tympanic, resp. rate 20, height 5\' 8"  (1.727 m), weight 220 lb (99.8 kg), SpO2 100 %.   ECOG: 1   General appearance: Comfortable appearing without any discomfort Head: Normocephalic without any trauma Oropharynx: Mucous membranes are moist and pink without any thrush or ulcers. Eyes: Pupils are equal and round reactive to light. Lymph nodes: No cervical, supraclavicular, inguinal or axillary lymphadenopathy.   Heart:regular rate and rhythm.  S1 and S2 without leg edema. Lung: Clear without any rhonchi or wheezes.  No dullness to percussion. Abdomin: Soft, nontender, nondistended with good bowel sounds.  No hepatosplenomegaly. Musculoskeletal: No joint deformity or effusion.  Full range of motion noted. Neurological: No deficits noted on motor, sensory and deep tendon reflex exam. Skin: No petechial rash or dryness.  Appeared moist.      Lab Results: Lab Results  Component Value Date   WBC 5.9 02/20/2021   HGB 10.2 (L) 02/20/2021   HCT 31.4 (L) 02/20/2021   MCV 93.2 02/20/2021  PLT 281 02/20/2021     Chemistry      Component Value Date/Time   NA 138 02/20/2021 0812   K 4.2 02/20/2021 0812   CL 107 02/20/2021 0812   CO2 25 02/20/2021 0812   BUN 26 (H) 02/20/2021 0812   CREATININE 0.77 02/20/2021 0812      Component Value Date/Time   CALCIUM 9.2 02/20/2021 0812   ALKPHOS 152 (H) 02/20/2021 0812   AST 20 02/20/2021 0812   ALT 45 (H) 02/20/2021 0812   BILITOT 0.4 02/20/2021 0812       Latest Reference Range & Units  01/09/21 13:10 01/31/21 08:09 02/20/21 08:12  Prostate Specific Ag, Serum 0.0 - 4.0 ng/mL 57.1 (H) 19.4 (H) 10.8 (H)  (H): Data is abnormally high  Impression and Plan:  76 year old with:  1.  Castration-sensitive advanced prostate cancer with disease to the bone and lymphadenopathy diagnosed in October 2022.   He continues to tolerate Taxotere chemotherapy with reasonable response.  His PSA continues to decline and was at 10.8 in January 2023.  Risks and benefits of continuing this treatment were reviewed at this time.  Alternative treatment choices including abiraterone or enzalutamide could be used in subsequent or concomitant to his chemotherapy administration.  He is agreeable to proceed and we will reduce the dose of Taxotere for better tolerance given his excessive fatigue.     2.  IV access: Port-A-Cath continues to be in use without any issues.   3.  Antiemetics: No nausea or vomiting reported at this time.  Compazine is available to him.   4.  Androgen deprivation therapy: This will be continued indefinitely.  Complications including weight gain and hot flashes among others were reviewed.  Next injection will be in March 2023.   5.  Goals of care: Aggressive measures are warranted given his excellent performance status for treatment is palliative.   6.  Bone directed therapy: It would be reasonable to use Xgeva after obtaining dental clearance.  I recommended calcium and vitamin D supplements.   7.  Growth factor support: He will receive growth factor support after each cycle of therapy given his risk of neutropenia.   8.  Follow-up: In 3 weeks for repeat follow-up.  30  minutes were spent on this encounter.  The time was dedicated to reviewing laboratory data, disease status update and outlining future plan of care discussion.   Zola Button, MD 2/1/202311:31 AM

## 2021-03-14 NOTE — Patient Instructions (Signed)
[facility]  Discharge Instructions: ?Thank you for choosing [facility] to provide your oncology and hematology care.  ? ?If you have a lab appointment with the Cancer Center, please go directly to the Cancer Center and check in at the registration area. ?  ?Wear comfortable clothing and clothing appropriate for easy access to any Portacath or PICC line.  ? ?We strive to give you quality time with your provider. You may need to reschedule your appointment if you arrive late (15 or more minutes).  Arriving late affects you and other patients whose appointments are after yours.  Also, if you miss three or more appointments without notifying the office, you may be dismissed from the clinic at the provider?s discretion.    ?  ?For prescription refill requests, have your pharmacy contact our office and allow 72 hours for refills to be completed.   ? ?Today you received the following chemotherapy and/or immunotherapy agents : Taxotere    ?  ?To help prevent nausea and vomiting after your treatment, we encourage you to take your nausea medication as directed. ? ?BELOW ARE SYMPTOMS THAT SHOULD BE REPORTED IMMEDIATELY: ?*FEVER GREATER THAN 100.4 F (38 ?C) OR HIGHER ?*CHILLS OR SWEATING ?*NAUSEA AND VOMITING THAT IS NOT CONTROLLED WITH YOUR NAUSEA MEDICATION ?*UNUSUAL SHORTNESS OF BREATH ?*UNUSUAL BRUISING OR BLEEDING ?*URINARY PROBLEMS (pain or burning when urinating, or frequent urination) ?*BOWEL PROBLEMS (unusual diarrhea, constipation, pain near the anus) ?TENDERNESS IN MOUTH AND THROAT WITH OR WITHOUT PRESENCE OF ULCERS (sore throat, sores in mouth, or a toothache) ?UNUSUAL RASH, SWELLING OR PAIN  ?UNUSUAL VAGINAL DISCHARGE OR ITCHING  ? ?Items with * indicate a potential emergency and should be followed up as soon as possible or go to the Emergency Department if any problems should occur. ? ?Please show the CHEMOTHERAPY ALERT CARD or IMMUNOTHERAPY ALERT CARD at check-in to  the Emergency Department and triage nurse. ? ?Should you have questions after your visit or need to cancel or reschedule your appointment, please contact [facility]  Dept: [phone]  and follow the prompts.  Office hours are 8:00 a.m. to 4:30 p.m. Monday - Friday. Please note that voicemails left after 4:00 p.m. may not be returned until the following business day.  We are closed weekends and major holidays. You have access to a nurse at all times for urgent questions. Please call the main number to the clinic Dept: [phone] and follow the prompts. ? ? ?For any non-urgent questions, you may also contact your provider using MyChart. We now offer e-Visits for anyone 18 and older to request care online for non-urgent symptoms. For details visit [email or web address]. ?  ?Also download the MyChart app! Go to the app store, search "MyChart", open the app, select [facility], and log in with your MyChart username and password. ? ?Due to Covid, a mask is required upon entering the hospital/clinic. If you do not have a mask, one will be given to you upon arrival. For doctor visits, patients may have 1 support person aged 18 or older with them. For treatment visits, patients cannot have anyone with them due to current Covid guidelines and our immunocompromised population.  ? ?

## 2021-03-15 ENCOUNTER — Telehealth: Payer: Self-pay | Admitting: Oncology

## 2021-03-15 ENCOUNTER — Telehealth: Payer: Self-pay | Admitting: *Deleted

## 2021-03-15 LAB — PROSTATE-SPECIFIC AG, SERUM (LABCORP): Prostate Specific Ag, Serum: 6.1 ng/mL — ABNORMAL HIGH (ref 0.0–4.0)

## 2021-03-15 NOTE — Telephone Encounter (Signed)
Called patient regarding upcoming appointments in February, left a voicemail.

## 2021-03-15 NOTE — Telephone Encounter (Addendum)
Contacted patient per MD directions with information in message below. Left message for patient on named voice mail - gave office number for any questions or concerns.   ----- Message from [name], MD sent at 03/15/2021  8:16 AM EST ----- Please let him know his PSA is down

## 2021-03-16 ENCOUNTER — Inpatient Hospital Stay: Payer: PPO

## 2021-03-16 ENCOUNTER — Other Ambulatory Visit: Payer: Self-pay

## 2021-03-16 VITALS — BP 127/63 | HR 88 | Temp 97.8°F | Resp 18

## 2021-03-16 DIAGNOSIS — C61 Malignant neoplasm of prostate: Secondary | ICD-10-CM

## 2021-03-16 DIAGNOSIS — Z5111 Encounter for antineoplastic chemotherapy: Secondary | ICD-10-CM | POA: Diagnosis not present

## 2021-03-16 MED ORDER — PEGFILGRASTIM-CBQV 6 MG/0.6ML ~~LOC~~ SOSY
6.0000 mg | PREFILLED_SYRINGE | Freq: Once | SUBCUTANEOUS | Status: AC
  Administered 2021-03-16: 6 mg via SUBCUTANEOUS
  Filled 2021-03-16: qty 0.6

## 2021-03-16 NOTE — Patient Instructions (Signed)

## 2021-04-02 MED FILL — Dexamethasone Sodium Phosphate Inj 100 MG/10ML: INTRAMUSCULAR | Qty: 1 | Status: AC

## 2021-04-03 ENCOUNTER — Inpatient Hospital Stay: Payer: PPO

## 2021-04-03 ENCOUNTER — Other Ambulatory Visit: Payer: Self-pay

## 2021-04-03 ENCOUNTER — Inpatient Hospital Stay: Payer: PPO | Admitting: Oncology

## 2021-04-03 VITALS — BP 122/66 | HR 85 | Temp 98.0°F | Resp 18 | Ht 68.0 in | Wt 221.2 lb

## 2021-04-03 DIAGNOSIS — Z5111 Encounter for antineoplastic chemotherapy: Secondary | ICD-10-CM | POA: Diagnosis not present

## 2021-04-03 DIAGNOSIS — C61 Malignant neoplasm of prostate: Secondary | ICD-10-CM

## 2021-04-03 DIAGNOSIS — Z95828 Presence of other vascular implants and grafts: Secondary | ICD-10-CM

## 2021-04-03 LAB — CMP (CANCER CENTER ONLY)
ALT: 29 U/L (ref 0–44)
AST: 18 U/L (ref 15–41)
Albumin: 4.1 g/dL (ref 3.5–5.0)
Alkaline Phosphatase: 112 U/L (ref 38–126)
Anion gap: 4 — ABNORMAL LOW (ref 5–15)
BUN: 22 mg/dL (ref 8–23)
CO2: 28 mmol/L (ref 22–32)
Calcium: 9.3 mg/dL (ref 8.9–10.3)
Chloride: 107 mmol/L (ref 98–111)
Creatinine: 0.76 mg/dL (ref 0.61–1.24)
GFR, Estimated: >60 mL/min (ref >60)
Glucose, Bld: 120 mg/dL — ABNORMAL HIGH (ref 70–99)
Potassium: 4 mmol/L (ref 3.5–5.1)
Sodium: 139 mmol/L (ref 135–145)
Total Bilirubin: 0.6 mg/dL (ref 0.3–1.2)
Total Protein: 6.6 g/dL (ref 6.5–8.1)

## 2021-04-03 LAB — CBC WITH DIFFERENTIAL (CANCER CENTER ONLY)
Abs Immature Granulocytes: 0.02 10*3/uL (ref 0.00–0.07)
Basophils Absolute: 0 10*3/uL (ref 0.0–0.1)
Basophils Relative: 0 %
Eosinophils Absolute: 0 10*3/uL (ref 0.0–0.5)
Eosinophils Relative: 0 %
HCT: 30.4 % — ABNORMAL LOW (ref 39.0–52.0)
Hemoglobin: 10 g/dL — ABNORMAL LOW (ref 13.0–17.0)
Immature Granulocytes: 0 %
Lymphocytes Relative: 17 %
Lymphs Abs: 1 10*3/uL (ref 0.7–4.0)
MCH: 31.8 pg (ref 26.0–34.0)
MCHC: 32.9 g/dL (ref 30.0–36.0)
MCV: 96.8 fL (ref 80.0–100.0)
Monocytes Absolute: 0.6 10*3/uL (ref 0.1–1.0)
Monocytes Relative: 11 %
Neutro Abs: 4 10*3/uL (ref 1.7–7.7)
Neutrophils Relative %: 72 %
Platelet Count: 250 10*3/uL (ref 150–400)
RBC: 3.14 MIL/uL — ABNORMAL LOW (ref 4.22–5.81)
RDW: 18.2 % — ABNORMAL HIGH (ref 11.5–15.5)
WBC Count: 5.6 10*3/uL (ref 4.0–10.5)
nRBC: 0 % (ref 0.0–0.2)

## 2021-04-03 MED ORDER — SODIUM CHLORIDE 0.9% FLUSH: 10.0000 mL | INTRAVENOUS | Status: DC | PRN

## 2021-04-03 MED ORDER — SODIUM CHLORIDE 0.9 % IV SOLN
60.0000 mg/m2 | Freq: Once | INTRAVENOUS | Status: AC
  Administered 2021-04-03: 130 mg via INTRAVENOUS
  Filled 2021-04-03: qty 13

## 2021-04-03 MED ORDER — SODIUM CHLORIDE 0.9 % IV SOLN
10.0000 mg | Freq: Once | INTRAVENOUS | Status: AC
  Administered 2021-04-03: 10 mg via INTRAVENOUS
  Filled 2021-04-03: qty 10

## 2021-04-03 MED ORDER — SODIUM CHLORIDE 0.9 % IV SOLN: Freq: Once | INTRAVENOUS | Status: AC

## 2021-04-03 MED ORDER — SODIUM CHLORIDE 0.9% FLUSH
10.0000 mL | Freq: Once | INTRAVENOUS | Status: AC
  Administered 2021-04-03: 10 mL

## 2021-04-03 MED ORDER — HEPARIN SOD (PORK) LOCK FLUSH 100 UNIT/ML IV SOLN
500.0000 [IU] | Freq: Once | INTRAVENOUS | Status: AC | PRN
  Administered 2021-04-03: 500 [IU]

## 2021-04-03 MED ORDER — SODIUM CHLORIDE 0.9% FLUSH
10.0000 mL | Freq: Once | INTRAVENOUS | Status: AC
  Administered 2021-04-03: 10 mL via INTRAVENOUS

## 2021-04-03 NOTE — Patient Instructions (Signed)
[facility]  Discharge Instructions: °Thank you for choosing [facility] to provide your oncology and hematology care.  ° °If you have a lab appointment with the [facility], please go directly to the [facility] and check in at the registration area. °  °Wear comfortable clothing and clothing appropriate for easy access to any Portacath or PICC line.  ° °We strive to give you quality time with your provider. You may need to reschedule your appointment if you arrive late (15 or more minutes).  Arriving late affects you and other patients whose appointments are after yours.  Also, if you miss three or more appointments without notifying the office, you may be dismissed from the clinic at the provider’s discretion.    °  °For prescription refill requests, have your pharmacy contact our office and allow 72 hours for refills to be completed.   ° °Today you received the following chemotherapy and/or immunotherapy agents Docetaxel    °  °To help prevent nausea and vomiting after your treatment, we encourage you to take your nausea medication as directed. ° °BELOW ARE SYMPTOMS THAT SHOULD BE REPORTED IMMEDIATELY: °*FEVER GREATER THAN 100.4 F (38 °C) OR HIGHER °*CHILLS OR SWEATING °*NAUSEA AND VOMITING THAT IS NOT CONTROLLED WITH YOUR NAUSEA MEDICATION °*UNUSUAL SHORTNESS OF BREATH °*UNUSUAL BRUISING OR BLEEDING °*URINARY PROBLEMS (pain or burning when urinating, or frequent urination) °*BOWEL PROBLEMS (unusual diarrhea, constipation, pain near the anus) °TENDERNESS IN MOUTH AND THROAT WITH OR WITHOUT PRESENCE OF ULCERS (sore throat, sores in mouth, or a toothache) °UNUSUAL RASH, SWELLING OR PAIN  °UNUSUAL VAGINAL DISCHARGE OR ITCHING  ° °Items with * indicate a potential emergency and should be followed up as soon as possible or go to the Emergency Department if any problems should occur. ° °Please show the CHEMOTHERAPY ALERT CARD or IMMUNOTHERAPY ALERT CARD at check-in to  the Emergency Department and triage nurse. ° °Should you have questions after your visit or need to cancel or reschedule your appointment, please contact [facility]  Dept: [phone]  and follow the prompts.  Office hours are 8:00 a.m. to 4:30 p.m. Monday - Friday. Please note that voicemails left after 4:00 p.m. may not be returned until the following business day.  We are closed weekends and major holidays. You have access to a nurse at all times for urgent questions. Please call the main number to the clinic Dept: [phone] and follow the prompts. ° ° °For any non-urgent questions, you may also contact your provider using MyChart. We now offer e-Visits for anyone 18 and older to request care online for non-urgent symptoms. For details visit [email or web address]. °  °Also download the MyChart app! Go to the app store, search "MyChart", open the app, select [facility], and log in with your MyChart username and password. ° °Due to Covid, a mask is required upon entering the hospital/clinic. If you do not have a mask, one will be given to you upon arrival. For doctor visits, patients may have 1 support person aged 18 or older with them. For treatment visits, patients cannot have anyone with them due to current Covid guidelines and our immunocompromised population.  ° °

## 2021-04-03 NOTE — Progress Notes (Signed)
Hematology and Oncology Follow Up Visit  NOX TALENT [id number] 03-May-1945 76 y.o. 04/03/2021 11:18 AM [facility], [name], MD   Principle Diagnosis: 76 year old with prostate cancer diagnosed in October 2021 with a Gleason score 6.  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    Prior Therapy: He was on active surveillance and did not receive any additional treatment.  His PSA increased to 83.2 in September 2022 and a PET scan showed diffuse involvement.  Repeat prostate biopsy on November 27, 2020 showed a Gleason score 4+5 = 9.  Current therapy:  Eligard 22.5 mg every 3 months started on February 02, 2021.   Taxotere chemotherapy 75 mg per metered squared started on January 09, 2021.  The dose was changed to 60 mg per metered square starting with cycle 4 of therapy.  He is here for cycle 5 of therapy for a total of 6.   Interim History: Mr. Grudzien presents today for repeat evaluation.  Since the last visit, he reports no major changes in his health.  He has tolerated the last cycle of chemotherapy without any major complications.  He has reported some mild fatigue and tiredness which has persisted and has interfered mildly with his function.  Still able to drive and attends to activities of daily living.  He denies any worsening neuropathy, nausea or vomiting.  His appetite remains intact.    Medications: Updated on review. Current Outpatient Medications  Medication Sig Dispense Refill   buPROPion (WELLBUTRIN XL) 150 MG 24 hr tablet Take 150 mg by mouth daily.     CALCIUM PO Take 1,200 mg by mouth daily.     cetirizine (ZYRTEC) 10 MG tablet Take 10 mg by mouth daily as needed for allergies.     cholecalciferol (VITAMIN D3) 25 MCG (1000 UNIT) tablet Take 1,000 Units by mouth daily.     irbesartan-hydrochlorothiazide (AVALIDE) 300-12.5 MG tablet Take 1 tablet by mouth every morning.       lidocaine-prilocaine (EMLA) cream Apply 1 application topically as needed. 30 g 0   Multiple Vitamins-Minerals (ADULT GUMMY PO) Take 2 capsules by mouth daily.     naproxen sodium (ALEVE) 220 MG tablet Take 220 mg by mouth daily as needed (pain).     prochlorperazine (COMPAZINE) 10 MG tablet TAKE 1 TABLET(10 MG) BY MOUTH EVERY 6 HOURS AS NEEDED FOR NAUSEA OR VOMITING 30 tablet 0   rosuvastatin (CRESTOR) 20 MG tablet Take 20 mg by mouth daily.     No current facility-administered medications for this visit.       Physical Exam:  Blood pressure 122/66, pulse 85, temperature 98 F (36.7 C), temperature source Axillary, resp. rate 18, height 5\' 8"  (1.727 m), weight 221 lb 3.2 oz (100.3 kg), SpO2 100 %.    ECOG: 1     General appearance: Alert, awake without any distress. Head: Atraumatic without abnormalities Oropharynx: Without any thrush or ulcers. Eyes: No scleral icterus. Lymph nodes: No lymphadenopathy noted in the cervical, supraclavicular, or axillary nodes Heart:regular rate and rhythm, without any murmurs or gallops.   Lung: Clear to auscultation without any rhonchi, wheezes or dullness to percussion. Abdomin: Soft, nontender without any shifting dullness or ascites. Musculoskeletal: No clubbing or cyanosis. Neurological: No motor or sensory deficits. Skin: No rashes or lesions.      Lab Results: Lab Results  Component Value Date   WBC 5.7 03/14/2021   HGB 10.1 (L) 03/14/2021   HCT 31.2 (L)  03/14/2021   MCV 94.3 03/14/2021   PLT 253 03/14/2021     Chemistry      Component Value Date/Time   NA 139 03/14/2021 1208   K 4.1 03/14/2021 1208   CL 106 03/14/2021 1208   CO2 27 03/14/2021 1208   BUN 21 03/14/2021 1208   CREATININE 0.77 03/14/2021 1208      Component Value Date/Time   CALCIUM 9.4 03/14/2021 1208   ALKPHOS 134 (H) 03/14/2021 1208   AST 17 03/14/2021 1208   ALT 29 03/14/2021 1208   BILITOT 0.6 03/14/2021 1208       Latest Reference Range &  Units 02/20/21 08:12 03/14/21 12:08  Prostate Specific Ag, Serum 0.0 - 4.0 ng/mL 10.8 (H) 6.1 (H)  (H): Data is abnormally high  Impression and Plan:  76 year old with:  1.  Advanced prostate cancer diagnosed in October 2022.  He was found to have castration-sensitive disease with bone and lymphadenopathy.  Risks and benefits of continuing Taxotere chemotherapy were reviewed.  Complications that include nausea, vomiting, loss of motion, neutropenia and possible sepsis were reiterated.  Therapy escalation with androgen receptor pathway inhibitors including abiraterone, among other agents were discussed.  At this time, given his symptoms associated with chemotherapy have deferred the option of therapy escalation until the conclusion of chemotherapy.  He is agreeable to proceed with the fifth cycle and the plan to complete 6 cycles of therapy.   2.  IV access: Port-A-Cath remains in use without any issues.   3.  Antiemetics: Compazine is available to home without any nausea or vomiting.   4.  Androgen deprivation therapy: He will receive his next injection in March 2020.   5.  Goals of care: His disease is incurable although aggressive measures are warranted.   6.  Bone directed therapy: He is to continue calcium and vitamin D supplements.  X-ray will be considered after obtaining clearance.   7.  Growth factor support: He is at risk of neutropenia and possible sepsis.  Growth factor support is recommended after each cycle.   8.  Follow-up: In 3 weeks for repeat follow-up.  30  minutes were dedicated to this visit.  The time was spent on reviewing his disease status, reviewing treatment choices and addressing complications related to cancer and cancer therapy.   Zola Button, MD 2/21/202311:18 AM

## 2021-04-04 ENCOUNTER — Telehealth: Payer: Self-pay | Admitting: *Deleted

## 2021-04-04 LAB — PROSTATE-SPECIFIC AG, SERUM (LABCORP): Prostate Specific Ag, Serum: 5.2 ng/mL — ABNORMAL HIGH (ref 0.0–4.0)

## 2021-04-04 NOTE — Telephone Encounter (Signed)
-----   Message from [name], MD sent at 04/04/2021  8:34 AM EST ----- Please let him know his PSA is down

## 2021-04-04 NOTE — Telephone Encounter (Signed)
Per Dr.Shadad, called pt with message below. Pt verbalized understanding.

## 2021-04-05 ENCOUNTER — Other Ambulatory Visit: Payer: Self-pay

## 2021-04-05 ENCOUNTER — Inpatient Hospital Stay: Payer: PPO

## 2021-04-05 VITALS — BP 132/69 | HR 84 | Temp 98.2°F | Resp 18

## 2021-04-05 DIAGNOSIS — C61 Malignant neoplasm of prostate: Secondary | ICD-10-CM

## 2021-04-05 DIAGNOSIS — Z5111 Encounter for antineoplastic chemotherapy: Secondary | ICD-10-CM | POA: Diagnosis not present

## 2021-04-05 MED ORDER — PEGFILGRASTIM-CBQV 6 MG/0.6ML ~~LOC~~ SOSY
6.0000 mg | PREFILLED_SYRINGE | Freq: Once | SUBCUTANEOUS | Status: AC
  Administered 2021-04-05: 6 mg via SUBCUTANEOUS
  Filled 2021-04-05: qty 0.6

## 2021-04-05 NOTE — Patient Instructions (Signed)

## 2021-04-18 DIAGNOSIS — Z125 Encounter for screening for malignant neoplasm of prostate: Secondary | ICD-10-CM | POA: Diagnosis not present

## 2021-04-18 DIAGNOSIS — I10 Essential (primary) hypertension: Secondary | ICD-10-CM | POA: Diagnosis not present

## 2021-04-18 DIAGNOSIS — R5383 Other fatigue: Secondary | ICD-10-CM | POA: Diagnosis not present

## 2021-04-18 DIAGNOSIS — E1169 Type 2 diabetes mellitus with other specified complication: Secondary | ICD-10-CM | POA: Diagnosis not present

## 2021-04-18 DIAGNOSIS — E785 Hyperlipidemia, unspecified: Secondary | ICD-10-CM | POA: Diagnosis not present

## 2021-04-24 ENCOUNTER — Telehealth: Payer: Self-pay | Admitting: Oncology

## 2021-04-24 DIAGNOSIS — C61 Malignant neoplasm of prostate: Secondary | ICD-10-CM | POA: Diagnosis not present

## 2021-04-24 DIAGNOSIS — R82998 Other abnormal findings in urine: Secondary | ICD-10-CM | POA: Diagnosis not present

## 2021-04-24 DIAGNOSIS — Z1212 Encounter for screening for malignant neoplasm of rectum: Secondary | ICD-10-CM | POA: Diagnosis not present

## 2021-04-24 DIAGNOSIS — E785 Hyperlipidemia, unspecified: Secondary | ICD-10-CM | POA: Diagnosis not present

## 2021-04-24 DIAGNOSIS — G4733 Obstructive sleep apnea (adult) (pediatric): Secondary | ICD-10-CM | POA: Diagnosis not present

## 2021-04-24 DIAGNOSIS — E669 Obesity, unspecified: Secondary | ICD-10-CM | POA: Diagnosis not present

## 2021-04-24 DIAGNOSIS — I10 Essential (primary) hypertension: Secondary | ICD-10-CM | POA: Diagnosis not present

## 2021-04-24 DIAGNOSIS — F39 Unspecified mood [affective] disorder: Secondary | ICD-10-CM | POA: Diagnosis not present

## 2021-04-24 DIAGNOSIS — K219 Gastro-esophageal reflux disease without esophagitis: Secondary | ICD-10-CM | POA: Diagnosis not present

## 2021-04-24 DIAGNOSIS — E1169 Type 2 diabetes mellitus with other specified complication: Secondary | ICD-10-CM | POA: Diagnosis not present

## 2021-04-24 DIAGNOSIS — Z0000 Encounter for general adult medical examination without abnormal findings: Secondary | ICD-10-CM | POA: Diagnosis not present

## 2021-04-24 DIAGNOSIS — K635 Polyp of colon: Secondary | ICD-10-CM | POA: Diagnosis not present

## 2021-04-24 DIAGNOSIS — F17200 Nicotine dependence, unspecified, uncomplicated: Secondary | ICD-10-CM | POA: Diagnosis not present

## 2021-04-24 NOTE — Telephone Encounter (Signed)
Called patient regarding 03/15 appointment, patient is notified. ?

## 2021-04-25 ENCOUNTER — Inpatient Hospital Stay: Payer: PPO | Admitting: Oncology

## 2021-04-25 ENCOUNTER — Other Ambulatory Visit: Payer: Self-pay

## 2021-04-25 ENCOUNTER — Inpatient Hospital Stay: Payer: PPO

## 2021-04-25 ENCOUNTER — Inpatient Hospital Stay: Payer: PPO | Attending: Oncology

## 2021-04-25 VITALS — BP 127/70 | HR 85 | Temp 97.7°F | Resp 19 | Ht 68.0 in | Wt 221.1 lb

## 2021-04-25 DIAGNOSIS — Z5111 Encounter for antineoplastic chemotherapy: Secondary | ICD-10-CM | POA: Diagnosis not present

## 2021-04-25 DIAGNOSIS — C61 Malignant neoplasm of prostate: Secondary | ICD-10-CM

## 2021-04-25 DIAGNOSIS — Z5189 Encounter for other specified aftercare: Secondary | ICD-10-CM | POA: Insufficient documentation

## 2021-04-25 DIAGNOSIS — Z95828 Presence of other vascular implants and grafts: Secondary | ICD-10-CM

## 2021-04-25 LAB — CMP (CANCER CENTER ONLY)
ALT: 27 U/L (ref 0–44)
AST: 17 U/L (ref 15–41)
Albumin: 3.8 g/dL (ref 3.5–5.0)
Alkaline Phosphatase: 90 U/L (ref 38–126)
Anion gap: 4 — ABNORMAL LOW (ref 5–15)
BUN: 26 mg/dL — ABNORMAL HIGH (ref 8–23)
CO2: 27 mmol/L (ref 22–32)
Calcium: 9.2 mg/dL (ref 8.9–10.3)
Chloride: 109 mmol/L (ref 98–111)
Creatinine: 0.92 mg/dL (ref 0.61–1.24)
GFR, Estimated: >60 mL/min (ref >60)
Glucose, Bld: 160 mg/dL — ABNORMAL HIGH (ref 70–99)
Potassium: 3.9 mmol/L (ref 3.5–5.1)
Sodium: 140 mmol/L (ref 135–145)
Total Bilirubin: 0.5 mg/dL (ref 0.3–1.2)
Total Protein: 6.3 g/dL — ABNORMAL LOW (ref 6.5–8.1)

## 2021-04-25 LAB — CBC WITH DIFFERENTIAL (CANCER CENTER ONLY)
Abs Immature Granulocytes: 0.02 10*3/uL (ref 0.00–0.07)
Basophils Absolute: 0 10*3/uL (ref 0.0–0.1)
Basophils Relative: 1 %
Eosinophils Absolute: 0 10*3/uL (ref 0.0–0.5)
Eosinophils Relative: 1 %
HCT: 28.6 % — ABNORMAL LOW (ref 39.0–52.0)
Hemoglobin: 9.2 g/dL — ABNORMAL LOW (ref 13.0–17.0)
Immature Granulocytes: 1 %
Lymphocytes Relative: 15 %
Lymphs Abs: 0.7 10*3/uL (ref 0.7–4.0)
MCH: 31.8 pg (ref 26.0–34.0)
MCHC: 32.2 g/dL (ref 30.0–36.0)
MCV: 99 fL (ref 80.0–100.0)
Monocytes Absolute: 0.5 10*3/uL (ref 0.1–1.0)
Monocytes Relative: 12 %
Neutro Abs: 3.2 10*3/uL (ref 1.7–7.7)
Neutrophils Relative %: 70 %
Platelet Count: 234 10*3/uL (ref 150–400)
RBC: 2.89 MIL/uL — ABNORMAL LOW (ref 4.22–5.81)
RDW: 17.7 % — ABNORMAL HIGH (ref 11.5–15.5)
WBC Count: 4.4 10*3/uL (ref 4.0–10.5)
nRBC: 0 % (ref 0.0–0.2)

## 2021-04-25 MED ORDER — SODIUM CHLORIDE 0.9 % IV SOLN
10.0000 mg | Freq: Once | INTRAVENOUS | Status: AC
  Administered 2021-04-25: 10 mg via INTRAVENOUS
  Filled 2021-04-25: qty 10

## 2021-04-25 MED ORDER — SODIUM CHLORIDE 0.9 % IV SOLN
60.0000 mg/m2 | Freq: Once | INTRAVENOUS | Status: AC
  Administered 2021-04-25: 130 mg via INTRAVENOUS
  Filled 2021-04-25: qty 13

## 2021-04-25 MED ORDER — SODIUM CHLORIDE 0.9 % IV SOLN: Freq: Once | INTRAVENOUS | Status: AC

## 2021-04-25 MED ORDER — SODIUM CHLORIDE 0.9% FLUSH
10.0000 mL | Freq: Once | INTRAVENOUS | Status: AC
  Administered 2021-04-25: 10 mL

## 2021-04-25 MED ORDER — HEPARIN SOD (PORK) LOCK FLUSH 100 UNIT/ML IV SOLN
500.0000 [IU] | Freq: Once | INTRAVENOUS | Status: AC | PRN
  Administered 2021-04-25: 500 [IU]

## 2021-04-25 MED ORDER — SODIUM CHLORIDE 0.9% FLUSH
10.0000 mL | INTRAVENOUS | Status: DC | PRN
  Administered 2021-04-25: 10 mL

## 2021-04-25 NOTE — Progress Notes (Signed)
Hematology and Oncology Follow Up Visit ? ?[facility] ?[id number] ?April 15, 1945 76 y.o. ?04/25/2021 10:53 AM ?[facility], MDAvva, [name], MD  ? 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  He initially presented with Gleason score 6 and localized disease in 2021.  ? ?Prior Therapy: He was on active surveillance and did not receive any additional treatment.  His PSA increased to 83.2 in September 2022 and a PET scan showed diffuse involvement. ? ?Repeat prostate biopsy on November 27, 2020 showed a Gleason score 4+5 = 9. ? ?Current therapy: ? ?Eligard 22.5 mg every 3 months started on February 02, 2021.  ? ?Taxotere chemotherapy 75 mg per metered squared started on January 09, 2021.  The dose was changed to 60 mg per metered square starting with cycle 4 of therapy.  He is here for cycle 6 of therapy. ? ?Interim History: [facility] returns today for a follow-up visit.  Since the last visit, he reports no major changes in his health.  He has tolerated the last cycle of chemotherapy without any major complaints.  He denies any nausea, vomiting or abdominal pain.  He denies any worsening neuropathy or excessive fatigue.  He continues to attempt activities of daily living without any decline. ? ? ? ?Medications: Reviewed without changes. ?Current Outpatient Medications  ?Medication Sig Dispense Refill  ? buPROPion (WELLBUTRIN XL) 150 MG 24 hr tablet Take 150 mg by mouth daily.    ? CALCIUM PO Take 1,200 mg by mouth daily.    ? cetirizine (ZYRTEC) 10 MG tablet Take 10 mg by mouth daily as needed for allergies.    ? cholecalciferol (VITAMIN D3) 25 MCG (1000 UNIT) tablet Take 1,000 Units by mouth daily.    ? irbesartan-hydrochlorothiazide (AVALIDE) 300-12.5 MG tablet Take 1 tablet by mouth every morning.     ? lidocaine-prilocaine (EMLA) cream Apply 1 application topically as needed. 30 g 0  ? Multiple  Vitamins-Minerals (ADULT GUMMY PO) Take 2 capsules by mouth daily.    ? naproxen sodium (ALEVE) 220 MG tablet Take 220 mg by mouth daily as needed (pain).    ? prochlorperazine (COMPAZINE) 10 MG tablet TAKE 1 TABLET(10 MG) BY MOUTH EVERY 6 HOURS AS NEEDED FOR NAUSEA OR VOMITING 30 tablet 0  ? rosuvastatin (CRESTOR) 20 MG tablet Take 20 mg by mouth daily.    ? ?No current facility-administered medications for this visit.  ? ? ? ? ? ?Physical Exam: ? ?Blood pressure 127/70, pulse 85, temperature 97.7 ?F (36.5 ?C), temperature source Axillary, resp. rate 19, height '5\' 8"'$  (1.727 m), weight 221 lb 1.6 oz (100.3 kg), SpO2 100 %. ? ? ? ? ?ECOG: 1 ? ? ?General appearance: Comfortable appearing without any discomfort ?Head: Normocephalic without any trauma ?Oropharynx: Mucous membranes are moist and pink without any thrush or ulcers. ?Eyes: Pupils are equal and round reactive to light. ?Lymph nodes: No cervical, supraclavicular, inguinal or axillary lymphadenopathy.   ?Heart:regular rate and rhythm.  S1 and S2 without leg edema. ?Lung: Clear without any rhonchi or wheezes.  No dullness to percussion. ?Abdomin: Soft, nontender, nondistended with good bowel sounds.  No hepatosplenomegaly. ?Musculoskeletal: No joint deformity or effusion.  Full range of motion noted. ?Neurological: No deficits noted on motor, sensory and deep tendon reflex exam. ?Skin: No petechial rash or dryness.  Appeared moist.  ? ? ? ? ? ?Lab Results: ?Lab Results  ?Component Value Date  ? WBC 5.6 04/03/2021  ?  HGB 10.0 (L) 04/03/2021  ? HCT 30.4 (L) 04/03/2021  ? MCV 96.8 04/03/2021  ? PLT 250 04/03/2021  ? ?  Chemistry   ?   ?Component Value Date/Time  ? NA 139 04/03/2021 1102  ? K 4.0 04/03/2021 1102  ? CL 107 04/03/2021 1102  ? CO2 28 04/03/2021 1102  ? BUN 22 04/03/2021 1102  ? CREATININE 0.76 04/03/2021 1102  ?    ?Component Value Date/Time  ? CALCIUM 9.3 04/03/2021 1102  ? ALKPHOS 112 04/03/2021 1102  ? AST 18 04/03/2021 1102  ? ALT 29 04/03/2021  1102  ? BILITOT 0.6 04/03/2021 1102  ?  ? ? ? Latest Reference Range & Units 01/09/21 13:10 01/31/21 08:09 02/20/21 08:12 03/14/21 12:08 04/03/21 11:02  ?Prostate Specific Ag, Serum 0.0 - 4.0 ng/mL 57.1 (H) 19.4 (H) 10.8 (H) 6.1 (H) 5.2 (H)  ?(H): Data is abnormally high ? ? ? ?Impression and Plan: ? ?76 year old with: ? ?1.  Castration-sensitive advanced prostate cancer with disease to the bone and lymphadenopathy diagnosed in October 2022.  ? ?He is currently on Taxotere chemotherapy without any major complications.  His PSA continues to show excellent response.  Risks and benefits of completing cycle 6 of therapy were discussed.  These complications that include nausea, myelosuppression, neutropenia possible sepsis were reiterated.  Upon completing the cycle 6 of therapy will transition to oral therapy utilizing androgen receptor pathway inhibitors or androgen receptor blockers. ? ?After evaluation today, he is agreeable to proceed and we will update his staging scan before the next visit. ?  ?2.  IV access: Port-A-Cath continues to be accessed without any difficulties. ?  ?3.  Antiemetics: No nausea or vomiting reported at this time.  Compazine is available to him. ?  ?4.  Androgen deprivation therapy: He is currently on Eligard and will be receiving it every 3 months.  Next injection will be with the next cycle of chemotherapy. ?  ?5.  Goals of care: Any treatment is palliative but aggressive measures are warranted at this time.  Given his excellent performance status and reasonable response I recommended continued aggressive measures. ?  ?6.  Bone directed therapy: Delton See has been deferred at this time till he obtains dental clearance.  I have recommended calcium and vitamin D supplements. ?  ?7.  Growth factor support: He will receive growth factor support after each cycle of therapy.  He is at risk of developing neutropenia and possible sepsis. ?  ?8.  Follow-up: He will return in 3 weeks for the next  evaluation. ? ? ?30  minutes were spent on this encounter.  The time was dedicated to reviewing laboratory data, disease status update and outlining future plan of care. ? ?Zola Button, MD ?3/15/202310:53 AM ? ?

## 2021-04-26 LAB — PROSTATE-SPECIFIC AG, SERUM (LABCORP): Prostate Specific Ag, Serum: 4.9 ng/mL — ABNORMAL HIGH (ref 0.0–4.0)

## 2021-04-27 ENCOUNTER — Telehealth: Payer: Self-pay | Admitting: *Deleted

## 2021-04-27 ENCOUNTER — Other Ambulatory Visit: Payer: Self-pay

## 2021-04-27 ENCOUNTER — Inpatient Hospital Stay: Payer: PPO

## 2021-04-27 VITALS — BP 148/58 | HR 98 | Temp 97.6°F | Resp 18

## 2021-04-27 DIAGNOSIS — C61 Malignant neoplasm of prostate: Secondary | ICD-10-CM

## 2021-04-27 DIAGNOSIS — Z5111 Encounter for antineoplastic chemotherapy: Secondary | ICD-10-CM | POA: Diagnosis not present

## 2021-04-27 MED ORDER — PEGFILGRASTIM-CBQV 6 MG/0.6ML ~~LOC~~ SOSY
6.0000 mg | PREFILLED_SYRINGE | Freq: Once | SUBCUTANEOUS | Status: AC
  Administered 2021-04-27: 6 mg via SUBCUTANEOUS
  Filled 2021-04-27: qty 0.6

## 2021-04-27 NOTE — Telephone Encounter (Signed)
PC to patient, informed him his PSA is 4.9, he verbalizes understanding. ?

## 2021-04-27 NOTE — Telephone Encounter (Signed)
-----   Message from [name], MD sent at 04/26/2021  8:37 AM EDT ----- ?Please let him know his PSA is down ?

## 2021-05-14 ENCOUNTER — Other Ambulatory Visit (HOSPITAL_COMMUNITY): Payer: PPO

## 2021-05-15 ENCOUNTER — Other Ambulatory Visit: Payer: Self-pay

## 2021-05-15 ENCOUNTER — Inpatient Hospital Stay: Payer: PPO | Attending: Oncology

## 2021-05-15 DIAGNOSIS — C61 Malignant neoplasm of prostate: Secondary | ICD-10-CM | POA: Diagnosis not present

## 2021-05-15 DIAGNOSIS — Z95828 Presence of other vascular implants and grafts: Secondary | ICD-10-CM

## 2021-05-15 LAB — CBC WITH DIFFERENTIAL (CANCER CENTER ONLY)
Abs Immature Granulocytes: 0.02 10*3/uL (ref 0.00–0.07)
Basophils Absolute: 0 10*3/uL (ref 0.0–0.1)
Basophils Relative: 1 %
Eosinophils Absolute: 0 10*3/uL (ref 0.0–0.5)
Eosinophils Relative: 0 %
HCT: 29.4 % — ABNORMAL LOW (ref 39.0–52.0)
Hemoglobin: 9.6 g/dL — ABNORMAL LOW (ref 13.0–17.0)
Immature Granulocytes: 0 %
Lymphocytes Relative: 16 %
Lymphs Abs: 0.8 10*3/uL (ref 0.7–4.0)
MCH: 32.2 pg (ref 26.0–34.0)
MCHC: 32.7 g/dL (ref 30.0–36.0)
MCV: 98.7 fL (ref 80.0–100.0)
Monocytes Absolute: 0.6 10*3/uL (ref 0.1–1.0)
Monocytes Relative: 11 %
Neutro Abs: 3.8 10*3/uL (ref 1.7–7.7)
Neutrophils Relative %: 72 %
Platelet Count: 237 10*3/uL (ref 150–400)
RBC: 2.98 MIL/uL — ABNORMAL LOW (ref 4.22–5.81)
RDW: 16.3 % — ABNORMAL HIGH (ref 11.5–15.5)
WBC Count: 5.2 10*3/uL (ref 4.0–10.5)
nRBC: 0 % (ref 0.0–0.2)

## 2021-05-15 LAB — CMP (CANCER CENTER ONLY)
ALT: 24 U/L (ref 0–44)
AST: 18 U/L (ref 15–41)
Albumin: 4 g/dL (ref 3.5–5.0)
Alkaline Phosphatase: 84 U/L (ref 38–126)
Anion gap: 6 (ref 5–15)
BUN: 24 mg/dL — ABNORMAL HIGH (ref 8–23)
CO2: 25 mmol/L (ref 22–32)
Calcium: 9.5 mg/dL (ref 8.9–10.3)
Chloride: 110 mmol/L (ref 98–111)
Creatinine: 0.71 mg/dL (ref 0.61–1.24)
GFR, Estimated: >60 mL/min (ref >60)
Glucose, Bld: 124 mg/dL — ABNORMAL HIGH (ref 70–99)
Potassium: 4 mmol/L (ref 3.5–5.1)
Sodium: 141 mmol/L (ref 135–145)
Total Bilirubin: 0.5 mg/dL (ref 0.3–1.2)
Total Protein: 6.6 g/dL (ref 6.5–8.1)

## 2021-05-15 MED ORDER — HEPARIN SOD (PORK) LOCK FLUSH 100 UNIT/ML IV SOLN
500.0000 [IU] | Freq: Once | INTRAVENOUS | Status: AC
  Administered 2021-05-15: 500 [IU]

## 2021-05-15 MED ORDER — SODIUM CHLORIDE 0.9% FLUSH
10.0000 mL | Freq: Once | INTRAVENOUS | Status: AC
  Administered 2021-05-15: 10 mL

## 2021-05-16 ENCOUNTER — Telehealth: Payer: Self-pay | Admitting: Oncology

## 2021-05-16 ENCOUNTER — Encounter: Payer: Self-pay | Admitting: Oncology

## 2021-05-16 LAB — PROSTATE-SPECIFIC AG, SERUM (LABCORP): Prostate Specific Ag, Serum: 9.5 ng/mL — ABNORMAL HIGH (ref 0.0–4.0)

## 2021-05-16 NOTE — Telephone Encounter (Signed)
.  Called patient to schedule appointment per 4/4 inbasket, patient is aware of date and time.   ?

## 2021-05-17 ENCOUNTER — Inpatient Hospital Stay: Payer: PPO | Admitting: Oncology

## 2021-05-17 ENCOUNTER — Inpatient Hospital Stay: Payer: PPO

## 2021-05-17 DIAGNOSIS — D225 Melanocytic nevi of trunk: Secondary | ICD-10-CM | POA: Diagnosis not present

## 2021-05-17 DIAGNOSIS — D2261 Melanocytic nevi of right upper limb, including shoulder: Secondary | ICD-10-CM | POA: Diagnosis not present

## 2021-05-17 DIAGNOSIS — L821 Other seborrheic keratosis: Secondary | ICD-10-CM | POA: Diagnosis not present

## 2021-05-17 DIAGNOSIS — L918 Other hypertrophic disorders of the skin: Secondary | ICD-10-CM | POA: Diagnosis not present

## 2021-05-17 DIAGNOSIS — D2239 Melanocytic nevi of other parts of face: Secondary | ICD-10-CM | POA: Diagnosis not present

## 2021-05-18 ENCOUNTER — Encounter (HOSPITAL_COMMUNITY)
Admission: RE | Admit: 2021-05-18 | Discharge: 2021-05-18 | Disposition: A | Discharge status: Home / Self Care | Payer: PPO | Source: Ambulatory Visit | Attending: Oncology | Admitting: Oncology

## 2021-05-18 ENCOUNTER — Inpatient Hospital Stay: Payer: PPO

## 2021-05-18 ENCOUNTER — Other Ambulatory Visit: Payer: Self-pay

## 2021-05-18 VITALS — BP 140/65 | HR 91 | Temp 98.0°F | Resp 18

## 2021-05-18 DIAGNOSIS — C61 Malignant neoplasm of prostate: Secondary | ICD-10-CM

## 2021-05-18 DIAGNOSIS — C7951 Secondary malignant neoplasm of bone: Secondary | ICD-10-CM | POA: Diagnosis not present

## 2021-05-18 DIAGNOSIS — Z95828 Presence of other vascular implants and grafts: Secondary | ICD-10-CM

## 2021-05-18 DIAGNOSIS — N1330 Unspecified hydronephrosis: Secondary | ICD-10-CM | POA: Diagnosis not present

## 2021-05-18 DIAGNOSIS — N210 Calculus in bladder: Secondary | ICD-10-CM | POA: Diagnosis not present

## 2021-05-18 MED ORDER — LEUPROLIDE ACETATE (4 MONTH) 30 MG ~~LOC~~ KIT
30.0000 mg | PACK | Freq: Once | SUBCUTANEOUS | Status: AC
  Administered 2021-05-18: 30 mg via SUBCUTANEOUS
  Filled 2021-05-18: qty 30

## 2021-05-18 MED ORDER — LEUPROLIDE ACETATE (4 MONTH) 30 MG IM KIT
30.0000 mg | PACK | Freq: Once | INTRAMUSCULAR | Status: DC
  Filled 2021-05-18: qty 30

## 2021-05-18 MED ORDER — PIFLIFOLASTAT F 18 (PYLARIFY) INJECTION
9.0000 | Freq: Once | INTRAVENOUS | Status: AC
Start: 2021-05-18 — End: 2021-05-18
  Administered 2021-05-18: 8.15 via INTRAVENOUS

## 2021-05-22 ENCOUNTER — Other Ambulatory Visit: Payer: Self-pay

## 2021-05-22 ENCOUNTER — Ambulatory Visit: Payer: PPO

## 2021-05-22 ENCOUNTER — Other Ambulatory Visit (HOSPITAL_COMMUNITY): Payer: Self-pay

## 2021-05-22 ENCOUNTER — Inpatient Hospital Stay (HOSPITAL_BASED_OUTPATIENT_CLINIC_OR_DEPARTMENT_OTHER): Payer: PPO | Admitting: Oncology

## 2021-05-22 VITALS — BP 137/74 | HR 93 | Temp 97.8°F | Resp 19 | Ht 68.0 in | Wt 218.7 lb

## 2021-05-22 DIAGNOSIS — C61 Malignant neoplasm of prostate: Secondary | ICD-10-CM | POA: Diagnosis not present

## 2021-05-22 MED ORDER — ABIRATERONE ACETATE 250 MG PO TABS
1000.0000 mg | ORAL_TABLET | Freq: Every day | ORAL | 0 refills | Status: DC
  Filled 2021-05-22 – 2021-05-23 (×2): qty 120, 30d supply, fill #0

## 2021-05-22 MED ORDER — PREDNISONE 5 MG PO TABS: 5.0000 mg | ORAL_TABLET | Freq: Every day | ORAL | 3 refills | Status: DC

## 2021-05-22 NOTE — Progress Notes (Signed)
Hematology and Oncology Follow Up Visit ? ?ADEOLUWA SILVERS ?[id number] ?07-May-1945 76 y.o. ?05/22/2021 4:06 PM ?[name], MDAvva, [name], MD  ? ?Principle Diagnosis: 76 year old with man with advanced prostate cancer with lymphadenopathy and disease to the bone diagnosed in October 2022.  He has castration-resistant disease at this time after presenting with Gleason score 6 and localized disease in 2021.  ? ?Prior Therapy: He was on active surveillance and did not receive any additional treatment.  His PSA increased to 83.2 in September 2022 and a PET scan showed diffuse involvement. ? ?Repeat prostate biopsy on November 27, 2020 showed a Gleason score 4+5 = 9. ? ?Eligard 22.5 mg every 3 months started on February 02, 2021. ? ?Taxotere chemotherapy 75 mg per metered squared started on January 09, 2021.  The dose was changed to 60 mg per metered square starting with cycle 4 of therapy.  He completed 6 cycles of therapy in March 2023. ? ?Current therapy: ? ?Eligard 30 mg every 4 months with the last injection given on May 18, 2021. ? ?Under consideration to start [facility]. ? ?Interim History: Mr. Swart is here for repeat evaluation.  Since the last visit, he completed Taxotere chemotherapy without any residual complications.  He has reported increased in his lower back pain and right-sided pelvic pain that is hindering his mobility.  He denies any new neurological deficits or weakness.  He is still able to ambulate short distances.  His performance status quality of life remains reasonable.  He is able to eat and maintain his. ? ? ? ?Medications: Updated on review. ?Current Outpatient Medications  ?Medication Sig Dispense Refill  ? buPROPion (WELLBUTRIN XL) 150 MG 24 hr tablet Take 150 mg by mouth daily.    ? CALCIUM PO Take 1,200 mg by mouth daily.    ? cetirizine (ZYRTEC) 10 MG tablet Take 10 mg by mouth daily as needed for allergies.    ? cholecalciferol (VITAMIN D3) 25 MCG (1000 UNIT) tablet Take 1,000  Units by mouth daily.    ? irbesartan-hydrochlorothiazide (AVALIDE) 300-12.5 MG tablet Take 1 tablet by mouth every morning.     ? lidocaine-prilocaine (EMLA) cream Apply 1 application topically as needed. 30 g 0  ? Multiple Vitamins-Minerals (ADULT GUMMY PO) Take 2 capsules by mouth daily.    ? naproxen sodium (ALEVE) 220 MG tablet Take 220 mg by mouth daily as needed (pain).    ? prochlorperazine (COMPAZINE) 10 MG tablet TAKE 1 TABLET(10 MG) BY MOUTH EVERY 6 HOURS AS NEEDED FOR NAUSEA OR VOMITING 30 tablet 0  ? rosuvastatin (CRESTOR) 20 MG tablet Take 20 mg by mouth daily.    ? ?No current facility-administered medications for this visit.  ? ? ? ? ? ?Physical Exam: ? ?Blood pressure 137/74, pulse 93, temperature 97.8 ?F (36.6 ?C), temperature source Temporal, resp. rate 19, height '5\' 8"'$  (1.727 m), weight 218 lb 11.2 oz (99.2 kg), SpO2 99 %. ? ? ? ? ?ECOG: 1 ? ? ? ?General appearance: Alert, awake without any distress. ?Head: Atraumatic without abnormalities ?Oropharynx: Without any thrush or ulcers. ?Eyes: No scleral icterus. ?Lymph nodes: No lymphadenopathy noted in the cervical, supraclavicular, or axillary nodes ?Heart:regular rate and rhythm, without any murmurs or gallops.   ?Lung: Clear to auscultation without any rhonchi, wheezes or dullness to percussion. ?Abdomin: Soft, nontender without any shifting dullness or ascites. ?Musculoskeletal: No clubbing or cyanosis. ?Neurological: No motor or sensory deficits. ?Skin: No rashes or lesions. ? ? ? ? ? ? ? ?Lab  Results: ?Lab Results  ?Component Value Date  ? WBC 5.2 05/15/2021  ? HGB 9.6 (L) 05/15/2021  ? HCT 29.4 (L) 05/15/2021  ? MCV 98.7 05/15/2021  ? PLT 237 05/15/2021  ? ?  Chemistry   ?   ?Component Value Date/Time  ? NA 141 05/15/2021 1116  ? K 4.0 05/15/2021 1116  ? CL 110 05/15/2021 1116  ? CO2 25 05/15/2021 1116  ? BUN 24 (H) 05/15/2021 1116  ? CREATININE 0.71 05/15/2021 1116  ?    ?Component Value Date/Time  ? CALCIUM 9.5 05/15/2021 1116  ? ALKPHOS 84  05/15/2021 1116  ? AST 18 05/15/2021 1116  ? ALT 24 05/15/2021 1116  ? BILITOT 0.5 05/15/2021 1116  ?  ? ? ? ? Latest Reference Range & Units 03/14/21 12:08 04/03/21 11:02 04/25/21 11:29 05/15/21 11:16  ?Prostate Specific Ag, Serum 0.0 - 4.0 ng/mL 6.1 (H) 5.2 (H) 4.9 (H) 9.5 (H)  ?(H): Data is abnormally high ? ? ?IMPRESSION: ?1. Positive response to therapy with significant decreased ?radiotracer activity in mediastinal lymph nodes, periaortic ?retroperitoneal nodes and pelvic lymph nodes. Low residual ?radiotracer activity. ?2. While there is extensive radiotracer avid skeletal metastasis ?there is measurable improvement multiple lesions. Additional lesions ?have equal intensity as to prior. Overall positive response with ?significant residual radiotracer avid skeletal metastasis. Interval ?increase in the sclerotic component on the CT portion exam. ?3. No change intense radiotracer activity in the prostate gland. ?4. No evidence disease progression. ?5. Interval increase in mild hydronephrosis and hydroureter. Query ?bladder outlet obstruction. Several stones within the bladder. ? ? ?Impression and Plan: ? ?76 year old with: ? ?1.  Advanced prostate cancer with disease to the bone and lymphadenopathy diagnosed in October 2022.  He is developing castration-resistant disease.   ? ? ?His disease status was updated at this time and treatment choices were reviewed.  PET scan obtained on May 18, 2021 was personally reviewed including reviewing the images with the patient and his daughter.  His PET scan does show positive response to therapy although he still has substantial residual bone disease that likely is causing his pain.  His PSA also started to rise indicating disease progression in the future. ? ?Risks and benefits of starting Zytiga were discussed at this time.  Complications that include nausea, fatigue, edema, hypertension and adrenal sufficiency were discussed.  Low-dose prednisone at 5 mg daily to  alleviate some of the side effects would be recommended.  After discussion today is agreeable to proceed. ? ? ? ?2.  IV access: Port-A-Cath will remain in place and flushed periodically. ?  ?3.  Bone pain: Related to his advanced prostate cancer.  We will refer him to radiation oncology for treatment of problematic lesions in the spine and pelvis. ?  ?4.  Androgen deprivation therapy: He is currently on Eligard with the last treatment given on May 18, 2021.  This will be repeated in July 2023. ? ? ?5.  Goals of care: Therapy remains palliative but aggressive measures are warranted given his reasonable performance status despite his incurable malignancy.   ?  ?6.  Bone directed therapy: I recommended calcium and vitamin D supplements consideration for Xgeva after dental clearance.. ?  ?7.  Follow-up: In 4 weeks for repeat evaluation. ? ? ?30  minutes were spent on this visit.  The time was dedicated to reviewing laboratory data, disease status update, reviewing imaging studies and future plan of care discussion. ? ?Zola Button, MD ?4/11/20234:06 PM ? ?

## 2021-05-23 ENCOUNTER — Telehealth: Payer: Self-pay | Admitting: Pharmacy Technician

## 2021-05-23 ENCOUNTER — Telehealth: Payer: Self-pay

## 2021-05-23 ENCOUNTER — Encounter: Payer: Self-pay | Admitting: Oncology

## 2021-05-23 ENCOUNTER — Other Ambulatory Visit (HOSPITAL_COMMUNITY): Payer: Self-pay

## 2021-05-23 NOTE — Telephone Encounter (Signed)
Currently no grants open for copay assistance. ?

## 2021-05-23 NOTE — Telephone Encounter (Signed)
Patient will start therapy with [facility] payment plan. Will add patient to clinic's grant watch list. ?

## 2021-05-23 NOTE — Telephone Encounter (Signed)
Oral Chemotherapy Pharmacist Encounter ? ?I spoke with patient for overview of: Zytiga for the treatment of advanced, castration-resistant prostate cancer in conjunction with prednisone, planned duration until disease progression or unacceptable toxicity.  ? ?Counseled patient on administration, dosing, side effects, monitoring, drug-food interactions, safe handling, storage, and disposal. ? ?Patient will take Zytiga '250mg'$  tablets, 4 tablets ('1000mg'$ ) by mouth once daily on an empty stomach, 1 hour before or 2 hours after a meal. ? ?Patient states he will take his Zytiga before breakfast int he morning and will wait at least 1 hour before eating. ? ?Patient will take prednisone '5mg'$  tablet, 1 tablet by mouth one daily with breakfast. ? ?Zytiga start date: 05/25/2021 ? ?Adverse effects include but are not limited to: peripheral edema, GI upset, hypertension, hot flashes, fatigue, and arthralgias.   ? ?Prednisone prescription has been sent to [facility] on 05/22/21- patient knows to pick up medication.Marland Kitchen ?Patient will obtain prednisone and knows to start prednisone on the same day as Zytiga start. ? ?Reviewed with patient importance of keeping a medication schedule and plan for any missed doses. No barriers to medication adherence identified. ? ?Medication reconciliation performed and medication/allergy list updated. ? ?Ship broker for [facility] has been obtained. ?Test claim at the pharmacy revealed copayment $123 for 1st fill of 30 days. ?This will ship from the [facility] on 05/23/21 to deliver to patient's home on 05/24/21. ? ?Patient informed the pharmacy will reach out 5-7 days prior to needing next fill of Zytiga to coordinate continued medication acquisition to prevent break in therapy. ? ?All questions answered. ? ?Mr. Poirier voiced understanding and appreciation.  ? ?Medication education handout placed in mail for patient. Patient knows to call the office with questions or  concerns. Oral Chemotherapy Clinic phone number provided to patient.  ? ?Drema Halon, PharmD ?Hematology/Oncology Clinical Pharmacist ?[facility] ?[phone] ?05/23/2021 9:07 AM ? ?  ?

## 2021-05-23 NOTE — Telephone Encounter (Signed)
Oral Oncology Patient Advocate Encounter ? ?Prior Authorization for Abiraterone Acetate '250MG'$  has been approved.   ? ?PA# Key: [id number] - PA Case ID: [id number] ?Effective dates: 05/23/21 through 05/23/22 ? ?Patients co-pay is $123.81 for 30 day supply. ? ?Oral Oncology Clinic will continue to follow.  ? ?

## 2021-05-23 NOTE — Telephone Encounter (Signed)
Received New start notification for  Zytiga/ Abiraterone Acetate '250MG'$  . Will update as we work through the benefits process.  ? ?Submitted a Prior Authorization request to  [employer]  for  Abiraterone Acetate '250MG'$   via CoverMyMeds. Will update once we receive a response. ? ? ?Key: [id number] - PA Case ID: [id number]  ?

## 2021-05-23 NOTE — Telephone Encounter (Addendum)
Oral Oncology Pharmacist Encounter ? ?Received new prescription for abiraterone (Zytiga) for the treatment of prostate cancer in conjunction with prednisone, planned duration until disease progression or unacceptable toxicity. Prescription dose and frequency assessed.  ? ?Labs from 05/15/21 assessed, no interventions needed. ? ?Current medication list in Epic reviewed, DDIs with zytiga identified:  ?- zytiga may increase effects of statins (monitor therapy) ? ?Evaluated chart and no patient barriers to medication adherence noted.  ? ?Patient agreement for treatment documented in MD note on 05/22/2021. ? ?Prescription has been e-scribed to the [facility] for benefits analysis and approval. ? ?Oral Oncology Clinic will continue to follow for insurance authorization, copayment issues, initial counseling and start date. ? ?Drema Halon, PharmD ?Hematology/Oncology Clinical Pharmacist ?[facility] ?[phone] ?05/23/2021 8:43 AM ? ?

## 2021-05-23 NOTE — Telephone Encounter (Signed)
-----   Message from Boyce Medici, [facility] sent at 05/22/2021  4:24 PM EDT ----- ?Regarding: FW: No Zytiga ? ?----- Message ----- ?From: [name], MD ?Sent: 05/22/2021   4:19 PM EDT ?To: Boyce Medici, RPH ?Subject: No Zytiga                                     ? ?Prescription sent to [facility].  He can start as soon as possible.  Thanks ? ? ?

## 2021-05-30 ENCOUNTER — Other Ambulatory Visit (HOSPITAL_COMMUNITY): Payer: Self-pay

## 2021-05-30 ENCOUNTER — Encounter: Payer: Self-pay | Admitting: Oncology

## 2021-05-30 NOTE — Telephone Encounter (Signed)
Was able to secure a $5,500 copay grant for patient: Info provided to pharmacy. Called patient and advised. ? ? ?

## 2021-05-30 NOTE — Progress Notes (Signed)
Histology and Location of Primary Cancer: Prostate Ca ? ?Sites of Visceral and Bony Metastatic Disease: Spine and pelvis ? ?Location(s) of Symptomatic Metastases: Spine and pelvis ? ?Past/Anticipated chemotherapy by medical oncology, if any:   ?Dr. Alen Blew ?PET:  05/18/2021  ?Show positive response to therapy although he still has substantial residual bone disease that likely is causing his pain.  ? ?Prior Therapy: He was on active surveillance and did not receive any additional treatment.  His PSA increased to 83.2 in September 2022 and a PET scan showed diffuse involvement. ?  ?Repeat prostate biopsy on November 27, 2020 showed a Gleason score 4+5 = 9. ?  ?Androgen deprivation therapy:  Eligard 22.5 mg every 3 months started on February 02, 2021.  Last treatment given on May 18, 2021. This will be repeated in July 2023. ?  ?Taxotere chemotherapy 75 mg per metered squared started on January 09, 2021.  The dose was changed to 60 mg per metered square starting with cycle 4 of therapy.  He completed 6 cycles of therapy in March 2023. ?  ?Current therapy: Eligard 30 mg every 4 months with the last injection given on May 18, 2021.  Under consideration to start [place]. ? ?IPSS:  12 ?SHIM:  5 ? ?Pain on a scale of 0-10 is:  0/10 ? ?If Spine Met(s), symptoms, if any, include: ?Bowel/Bladder retention or incontinence (please describe): No ?Numbness or weakness in extremities (please describe): left lower leg cold not weak/numb ?Current Decadron regimen, if applicable:  No ? ?Ambulatory status? Walker? Wheelchair?:  ambulates independently ? ?SAFETY ISSUES: ?Prior radiation?  No ?Pacemaker/ICD? No ?Possible current pregnancy? Male ?Is the patient on methotrexate? No ? ?Current Complaints / other details:  01/05/2021 (Implanted)Port-a-cath right chest. ? ? ?

## 2021-06-04 ENCOUNTER — Telehealth: Payer: Self-pay | Admitting: Oncology

## 2021-06-04 ENCOUNTER — Ambulatory Visit
Admission: RE | Admit: 2021-06-04 | Discharge: 2021-06-04 | Disposition: A | Discharge status: Home / Self Care | Payer: PPO | Source: Ambulatory Visit | Attending: Radiation Oncology | Admitting: Radiation Oncology

## 2021-06-04 ENCOUNTER — Other Ambulatory Visit: Payer: Self-pay

## 2021-06-04 DIAGNOSIS — F17210 Nicotine dependence, cigarettes, uncomplicated: Secondary | ICD-10-CM | POA: Insufficient documentation

## 2021-06-04 DIAGNOSIS — C61 Malignant neoplasm of prostate: Secondary | ICD-10-CM | POA: Diagnosis not present

## 2021-06-04 DIAGNOSIS — I10 Essential (primary) hypertension: Secondary | ICD-10-CM | POA: Diagnosis not present

## 2021-06-04 DIAGNOSIS — Z191 Hormone sensitive malignancy status: Secondary | ICD-10-CM | POA: Diagnosis not present

## 2021-06-04 DIAGNOSIS — Z87442 Personal history of urinary calculi: Secondary | ICD-10-CM | POA: Insufficient documentation

## 2021-06-04 DIAGNOSIS — C7951 Secondary malignant neoplasm of bone: Secondary | ICD-10-CM | POA: Diagnosis not present

## 2021-06-04 DIAGNOSIS — Z923 Personal history of irradiation: Secondary | ICD-10-CM | POA: Diagnosis not present

## 2021-06-04 DIAGNOSIS — Z79899 Other long term (current) drug therapy: Secondary | ICD-10-CM | POA: Diagnosis not present

## 2021-06-04 DIAGNOSIS — E785 Hyperlipidemia, unspecified: Secondary | ICD-10-CM | POA: Insufficient documentation

## 2021-06-04 DIAGNOSIS — N2889 Other specified disorders of kidney and ureter: Secondary | ICD-10-CM | POA: Insufficient documentation

## 2021-06-04 NOTE — Telephone Encounter (Signed)
Called patient regarding upcoming appointment, left a voicemail. 

## 2021-06-04 NOTE — Progress Notes (Signed)
?Radiation Oncology         [phone] ?________________________________ ? ?Initial Outpatient Consultation ? ?Name: Stephen Collins MRN: [id number]  ?Date: 06/04/2021  DOB: 04/19/1945 ? ?HY:WVPX, [name], MD  [name], MD  ? ?REFERRING PHYSICIAN: [name], MD ? ?DIAGNOSIS: 76 y.o. 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 ?  ICD-10-CM   ?1. Prostate cancer ([place])  C61   ?  ? ? ?HISTORY OF PRESENT ILLNESS: Stephen Collins is a 76 y.o. male with an initial diagnosis of prostate cancer in 2021.  At that time he was found to have a Gleason score of 3+3 equal 6 with a PSA of 14.8.  He was placed on active surveillance.  His PSA in July 2022 was 30 and subsequently was 83.2 in September 2022.  Based on these findings he underwent a PSMA PET scan obtained on December 07, 2020 showed uptake in the left prostate gland consistent with prostate primary.  It is multiple small radiotracer uptake in the lymph nodes including pelvic, para-aortic, retroperitoneal and mediastinal.  Widespread uptake in bony skeleton was also noted with more than 100 lesions.  ? ? ? ?Prostate biopsy obtained on November 27, 2020 showed Gleason score 4+5 = 9 in the lateral region in all cores.  ? ?Taxotere chemotherapy 75 mg per metered squared started on January 09, 2021.  Eligard 22.5 mg every 3 months started on February 02, 2021.  Taxotere dose was changed to 60 mg per metered square starting with cycle 4 of therapy.  He completed 6 cycles of therapy in March 2023. ? ?Follow-up PSMA PET on 05/18/21 showed improvement ? ? ?He has reported increased in his lower back pain and right-sided pelvic pain that is hindering his mobility and he has been referred today for consideration of palliative radiotherapy to these areas. ? ? ? ? ?PREVIOUS RADIATION THERAPY: No ? ?PAST MEDICAL HISTORY:  ?Past Medical History:  ?Diagnosis Date  ? Bilateral renal  cysts   ? Bladder stones   ? Depression   ? Frequency of urination   ? History of kidney stones   ? Hyperlipidemia   ? Hypertension   ? OSA on CPAP   ? Wears glasses   ?   ? ?PAST SURGICAL HISTORY: ?Past Surgical History:  ?Procedure Laterality Date  ? COLONOSCOPY  2006  ? CYSTOSCOPY WITH LITHOLAPAXY N/A 04/14/2015  ? Procedure: CYSTOSCOPY WITH LITHOLAPAXY;  Surgeon: Ardis Hughs, MD;  Location: [facility];  Service: Urology;  Laterality: N/A;  ? FLEXIBLE SIGMOIDOSCOPY N/A 09/22/2017  ? Procedure: FLEXIBLE SIGMOIDOSCOPY;  Surgeon: Laurence Spates, MD;  Location: [facility];  Service: Endoscopy;  Laterality: N/A;  ? IR IMAGING GUIDED PORT INSERTION  01/05/2021  ? POLYPECTOMY  09/22/2017  ? Procedure: POLYPECTOMY;  Surgeon: Laurence Spates, MD;  Location: [facility];  Service: Endoscopy;;  ? SUBMUCOSAL INJECTION  09/22/2017  ? Procedure: SUBMUCOSAL INJECTION;  Surgeon: Laurence Spates, MD;  Location: [facility];  Service: Endoscopy;;  ? TONSILLECTOMY  as child  ? ? ?FAMILY HISTORY: No family history on file. ? ?SOCIAL HISTORY:  ?Social History  ? ?Socioeconomic History  ? Marital status: Single  ?  Spouse name: Not on file  ? Number of children: Not on file  ? Years of education: Not on file  ? Highest education level: Not on file  ?Occupational History  ? Not on file  ?Tobacco Use  ? Smoking  status: Every Day  ?  Packs/day: 0.50  ?  Years: 48.00  ?  Pack years: 24.00  ?  Types: Cigarettes  ? Smokeless tobacco: Never  ?Substance and Sexual Activity  ? Alcohol use: Yes  ?  Alcohol/week: 12.0 standard drinks  ?  Types: 12 Glasses of wine per week  ?  Comment: 1-2 wine daily  ? Drug use: No  ? Sexual activity: Not on file  ?Other Topics Concern  ? Not on file  ?Social History Narrative  ? Not on file  ? ?Social Determinants of Health  ? ?Financial Resource Strain: Not on file  ?Food Insecurity: Not on file  ?Transportation Needs: Not on file  ?Physical Activity: Not on file  ?Stress: Not on file   ?Social Connections: Not on file  ?Intimate Partner Violence: Not on file  ? ? ?ALLERGIES: Patient has no known allergies. ? ?MEDICATIONS:  ?Current Outpatient Medications  ?Medication Sig Dispense Refill  ? abiraterone acetate (ZYTIGA) 250 MG tablet Take 4 tablets (1,000 mg total) by mouth daily. Take on an empty stomach 1 hour before or 2 hours after a meal 120 tablet 0  ? buPROPion (WELLBUTRIN XL) 150 MG 24 hr tablet Take 150 mg by mouth daily.    ? CALCIUM PO Take 1,200 mg by mouth daily.    ? cetirizine (ZYRTEC) 10 MG tablet Take 10 mg by mouth daily as needed for allergies.    ? cholecalciferol (VITAMIN D3) 25 MCG (1000 UNIT) tablet Take 1,000 Units by mouth daily.    ? irbesartan-hydrochlorothiazide (AVALIDE) 300-12.5 MG tablet Take 1 tablet by mouth every morning.     ? lidocaine-prilocaine (EMLA) cream Apply 1 application topically as needed. 30 g 0  ? Multiple Vitamins-Minerals (ADULT GUMMY PO) Take 2 capsules by mouth daily.    ? naproxen sodium (ALEVE) 220 MG tablet Take 220 mg by mouth daily as needed (pain).    ? predniSONE (DELTASONE) 5 MG tablet Take 1 tablet (5 mg total) by mouth daily with breakfast. 90 tablet 3  ? prochlorperazine (COMPAZINE) 10 MG tablet TAKE 1 TABLET(10 MG) BY MOUTH EVERY 6 HOURS AS NEEDED FOR NAUSEA OR VOMITING 30 tablet 0  ? rosuvastatin (CRESTOR) 20 MG tablet Take 20 mg by mouth daily.    ? ?No current facility-administered medications for this encounter.  ? ? ?REVIEW OF SYSTEMS:  On review of systems, the patient reports that he is doing well overall. He denies any chest pain, shortness of breath, cough, fevers, chills, night sweats, unintended weight changes. He denies any bowel disturbances, and denies abdominal pain, nausea or vomiting. He denies any other new musculoskeletal or joint aches or pains. A complete review of systems is obtained and is otherwise negative. ?  ?PHYSICAL EXAM:  ?Wt Readings from Last 3 Encounters:  ?05/22/21 218 lb 11.2 oz (99.2 kg)  ?04/25/21  221 lb 1.6 oz (100.3 kg)  ?04/03/21 221 lb 3.2 oz (100.3 kg)  ? ?Temp Readings from Last 3 Encounters:  ?05/22/21 97.8 ?F (36.6 ?C) (Temporal)  ?05/18/21 98 ?F (36.7 ?C) (Oral)  ?04/27/21 97.6 ?F (36.4 ?C) (Oral)  ? ?BP Readings from Last 3 Encounters:  ?05/22/21 137/74  ?05/18/21 140/65  ?04/27/21 (!) 148/58  ? ?Pulse Readings from Last 3 Encounters:  ?05/22/21 93  ?05/18/21 91  ?04/27/21 98  ? ? /10 ? ?In general this is a well appearing gentleman in no acute distress. He's alert and oriented x4 and appropriate throughout the examination. Cardiopulmonary assessment is  negative for acute distress, and he exhibits normal effort.   ? ?KPS = 70 ? ?100 - Normal; no complaints; no evidence of disease. ?90   - Able to carry on normal activity; minor signs or symptoms of disease. ?80   - Normal activity with effort; some signs or symptoms of disease. ?26   - Cares for self; unable to carry on normal activity or to do active work. ?60   - Requires occasional assistance, but is able to care for most of his personal needs. ?50   - Requires considerable assistance and frequent medical care. ?28   - Disabled; requires special care and assistance. ?30   - Severely disabled; hospital admission is indicated although death not imminent. ?20   - Very sick; hospital admission necessary; active supportive treatment necessary. ?10   - Moribund; fatal processes progressing rapidly. ?0     - Dead ? ?Karnofsky DA, Abelmann WH, Craver LS and Burchenal [facility] [phone]) The use of the nitrogen mustards in the palliative treatment of carcinoma: with particular reference to bronchogenic carcinoma Cancer 1 [phone] ? ?LABORATORY DATA:  ?Lab Results  ?Component Value Date  ? WBC 5.2 05/15/2021  ? HGB 9.6 (L) 05/15/2021  ? HCT 29.4 (L) 05/15/2021  ? MCV 98.7 05/15/2021  ? PLT 237 05/15/2021  ? ?Lab Results  ?Component Value Date  ? NA 141 05/15/2021  ? K 4.0 05/15/2021  ? CL 110 05/15/2021  ? CO2 25 05/15/2021  ? ?Lab Results  ?Component Value Date  ?  ALT 24 05/15/2021  ? AST 18 05/15/2021  ? ALKPHOS 84 05/15/2021  ? BILITOT 0.5 05/15/2021  ? ?  ?RADIOGRAPHY: NM PET (PSMA) SKULL TO MID THIGH ? ?Result Date: 05/22/2021 ?CLINICAL DATA:  Prostate carcinoma with

## 2021-06-04 NOTE — Progress Notes (Signed)
Histology and Location of Primary Cancer: Prostate Ca ? ?Sites of Visceral and Bony Metastatic Disease: Spine and pelvis ? ?Location(s) of Symptomatic Metastases: Spine and pelvis ? ?Past/Anticipated chemotherapy by medical oncology, if any:   ?Dr. Alen Blew ?PET:  05/18/2021  ?Show positive response to therapy although he still has substantial residual bone disease that likely is causing his pain.  ? ?Prior Therapy: He was on active surveillance and did not receive any additional treatment.  His PSA increased to 83.2 in September 2022 and a PET scan showed diffuse involvement. ?  ?Repeat prostate biopsy on November 27, 2020 showed a Gleason score 4+5 = 9. ?  ?Androgen deprivation therapy:  Eligard 22.5 mg every 3 months started on February 02, 2021.  Last treatment given on May 18, 2021. This will be repeated in July 2023. ?  ?Taxotere chemotherapy 75 mg per metered squared started on January 09, 2021.  The dose was changed to 60 mg per metered square starting with cycle 4 of therapy.  He completed 6 cycles of therapy in March 2023. ?  ?Current therapy: Eligard 30 mg every 4 months with the last injection given on May 18, 2021.  Under consideration to start [place]. ? ?IPSS:  12 ?SHIM:  5 ? ?Pain on a scale of 0-10 is:   ? ?If Spine Met(s), symptoms, if any, include: ?Bowel/Bladder retention or incontinence (please describe):  ?Numbness or weakness in extremities (please describe):  ?Current Decadron regimen, if applicable:  ? ?Ambulatory status? Walker? Wheelchair?:  ? ?SAFETY ISSUES: ?Prior radiation?  No ?Pacemaker/ICD? No ?Possible current pregnancy? Male ?Is the patient on methotrexate? No ? ?Current Complaints / other details:  01/05/2021 (Implanted)Port-a-cath right chest. ? ? ?

## 2021-06-05 DIAGNOSIS — C7951 Secondary malignant neoplasm of bone: Secondary | ICD-10-CM | POA: Insufficient documentation

## 2021-06-05 NOTE — Progress Notes (Signed)
?  Radiation Oncology         [phone] ?________________________________ ? ?Name: Stephen Collins MRN: [id number]  ?Date: 06/06/2021  DOB: [birth year] ? ?SIMULATION AND TREATMENT PLANNING NOTE ? ?  ICD-10-CM   ?1. Pain from bone metastases ([facility])  G89.3   ? C79.51   ?  ? ? ?DIAGNOSIS:  76 y.o. patient with L5 lumbar and sacral metastases ? ?NARRATIVE:  The patient was brought to the [facility].  Identity was confirmed.  All relevant records and images related to the planned course of therapy were reviewed.  The patient freely provided informed written consent to proceed with treatment after reviewing the details related to the planned course of therapy. The consent form was witnessed and verified by the simulation staff.  Then, the patient was set-up in a stable reproducible  supine position for radiation therapy.  CT images were obtained.  Surface markings were placed.  The CT images were loaded into the planning software.  Then the target and avoidance structures were contoured including kidneys.  Treatment planning then occurred.  The radiation prescription was entered and confirmed.  Then, I designed and supervised the construction of a total of multiple medically necessary complex treatment devices with body positioner and MLCs to shield kidneys and bowel.  The devices are described in the [facility] planning note.  I have requested : 3D Simulation  I have requested a DVH of the following structures: Left Kidney, Right Kidney and target. ? ?PLAN:  The patient will receive 30 Gy in 10 fractions. ? ?________________________________ ? ?Sheral Apley Tammi Klippel, M.D. ? ?

## 2021-06-06 ENCOUNTER — Other Ambulatory Visit: Payer: Self-pay

## 2021-06-06 ENCOUNTER — Ambulatory Visit
Admission: RE | Admit: 2021-06-06 | Discharge: 2021-06-06 | Disposition: A | Discharge status: Home / Self Care | Payer: PPO | Source: Ambulatory Visit | Attending: Radiation Oncology | Admitting: Radiation Oncology

## 2021-06-06 DIAGNOSIS — C7951 Secondary malignant neoplasm of bone: Secondary | ICD-10-CM | POA: Insufficient documentation

## 2021-06-06 DIAGNOSIS — Z191 Hormone sensitive malignancy status: Secondary | ICD-10-CM | POA: Diagnosis not present

## 2021-06-06 DIAGNOSIS — Z510 Encounter for antineoplastic radiation therapy: Secondary | ICD-10-CM | POA: Diagnosis not present

## 2021-06-06 DIAGNOSIS — C61 Malignant neoplasm of prostate: Secondary | ICD-10-CM | POA: Insufficient documentation

## 2021-06-07 ENCOUNTER — Other Ambulatory Visit: Payer: Self-pay

## 2021-06-07 ENCOUNTER — Ambulatory Visit
Admission: RE | Admit: 2021-06-07 | Discharge: 2021-06-07 | Disposition: A | Discharge status: Home / Self Care | Payer: PPO | Source: Ambulatory Visit | Attending: Radiation Oncology | Admitting: Radiation Oncology

## 2021-06-07 DIAGNOSIS — C7951 Secondary malignant neoplasm of bone: Secondary | ICD-10-CM | POA: Diagnosis not present

## 2021-06-07 DIAGNOSIS — Z191 Hormone sensitive malignancy status: Secondary | ICD-10-CM | POA: Diagnosis not present

## 2021-06-07 DIAGNOSIS — C61 Malignant neoplasm of prostate: Secondary | ICD-10-CM | POA: Diagnosis not present

## 2021-06-07 DIAGNOSIS — Z510 Encounter for antineoplastic radiation therapy: Secondary | ICD-10-CM | POA: Diagnosis not present

## 2021-06-07 LAB — RAD ONC ARIA SESSION SUMMARY
Course Elapsed Days: 0
Plan Fractions Treated to Date: 1
Plan Prescribed Dose Per Fraction: 3 Gy
Plan Total Fractions Prescribed: 10
Plan Total Prescribed Dose: 30 Gy
Reference Point Dosage Given to Date: 3 Gy
Reference Point Session Dosage Given: 3 Gy
Session Number: 1

## 2021-06-08 ENCOUNTER — Ambulatory Visit
Admission: RE | Admit: 2021-06-08 | Discharge: 2021-06-08 | Disposition: A | Discharge status: Home / Self Care | Payer: PPO | Source: Ambulatory Visit | Attending: Radiation Oncology | Admitting: Radiation Oncology

## 2021-06-08 ENCOUNTER — Other Ambulatory Visit: Payer: Self-pay

## 2021-06-08 DIAGNOSIS — C61 Malignant neoplasm of prostate: Secondary | ICD-10-CM | POA: Diagnosis not present

## 2021-06-08 DIAGNOSIS — Z510 Encounter for antineoplastic radiation therapy: Secondary | ICD-10-CM | POA: Diagnosis not present

## 2021-06-08 DIAGNOSIS — Z191 Hormone sensitive malignancy status: Secondary | ICD-10-CM | POA: Diagnosis not present

## 2021-06-08 DIAGNOSIS — C7951 Secondary malignant neoplasm of bone: Secondary | ICD-10-CM | POA: Diagnosis not present

## 2021-06-08 LAB — RAD ONC ARIA SESSION SUMMARY
Course Elapsed Days: 1
Plan Fractions Treated to Date: 2
Plan Prescribed Dose Per Fraction: 3 Gy
Plan Total Fractions Prescribed: 10
Plan Total Prescribed Dose: 30 Gy
Reference Point Dosage Given to Date: 6 Gy
Reference Point Session Dosage Given: 3 Gy
Session Number: 2

## 2021-06-11 ENCOUNTER — Other Ambulatory Visit: Payer: Self-pay

## 2021-06-11 ENCOUNTER — Ambulatory Visit
Admission: RE | Admit: 2021-06-11 | Discharge: 2021-06-11 | Disposition: A | Discharge status: Home / Self Care | Payer: PPO | Source: Ambulatory Visit | Attending: Radiation Oncology | Admitting: Radiation Oncology

## 2021-06-11 ENCOUNTER — Other Ambulatory Visit: Payer: Self-pay | Admitting: Oncology

## 2021-06-11 ENCOUNTER — Other Ambulatory Visit (HOSPITAL_COMMUNITY): Payer: Self-pay

## 2021-06-11 DIAGNOSIS — C7951 Secondary malignant neoplasm of bone: Secondary | ICD-10-CM | POA: Diagnosis not present

## 2021-06-11 DIAGNOSIS — C61 Malignant neoplasm of prostate: Secondary | ICD-10-CM | POA: Insufficient documentation

## 2021-06-11 DIAGNOSIS — Z510 Encounter for antineoplastic radiation therapy: Secondary | ICD-10-CM | POA: Diagnosis not present

## 2021-06-11 DIAGNOSIS — Z191 Hormone sensitive malignancy status: Secondary | ICD-10-CM | POA: Diagnosis not present

## 2021-06-11 LAB — RAD ONC ARIA SESSION SUMMARY
Course Elapsed Days: 4
Plan Fractions Treated to Date: 3
Plan Prescribed Dose Per Fraction: 3 Gy
Plan Total Fractions Prescribed: 10
Plan Total Prescribed Dose: 30 Gy
Reference Point Dosage Given to Date: 9 Gy
Reference Point Session Dosage Given: 3 Gy
Session Number: 3

## 2021-06-11 MED ORDER — ABIRATERONE ACETATE 250 MG PO TABS
1000.0000 mg | ORAL_TABLET | Freq: Every day | ORAL | 0 refills | Status: DC
  Filled 2021-06-13: qty 120, 30d supply, fill #0

## 2021-06-12 ENCOUNTER — Other Ambulatory Visit: Payer: Self-pay

## 2021-06-12 ENCOUNTER — Ambulatory Visit
Admission: RE | Admit: 2021-06-12 | Discharge: 2021-06-12 | Disposition: A | Discharge status: Home / Self Care | Payer: PPO | Source: Ambulatory Visit | Attending: Radiation Oncology | Admitting: Radiation Oncology

## 2021-06-12 DIAGNOSIS — C61 Malignant neoplasm of prostate: Secondary | ICD-10-CM | POA: Diagnosis not present

## 2021-06-12 DIAGNOSIS — C7951 Secondary malignant neoplasm of bone: Secondary | ICD-10-CM | POA: Diagnosis not present

## 2021-06-12 DIAGNOSIS — Z191 Hormone sensitive malignancy status: Secondary | ICD-10-CM | POA: Diagnosis not present

## 2021-06-12 DIAGNOSIS — Z510 Encounter for antineoplastic radiation therapy: Secondary | ICD-10-CM | POA: Diagnosis not present

## 2021-06-12 LAB — RAD ONC ARIA SESSION SUMMARY
Course Elapsed Days: 5
Plan Fractions Treated to Date: 4
Plan Prescribed Dose Per Fraction: 3 Gy
Plan Total Fractions Prescribed: 10
Plan Total Prescribed Dose: 30 Gy
Reference Point Dosage Given to Date: 12 Gy
Reference Point Session Dosage Given: 3 Gy
Session Number: 4

## 2021-06-13 ENCOUNTER — Other Ambulatory Visit (HOSPITAL_COMMUNITY): Payer: Self-pay

## 2021-06-13 ENCOUNTER — Other Ambulatory Visit: Payer: Self-pay

## 2021-06-13 ENCOUNTER — Ambulatory Visit
Admission: RE | Admit: 2021-06-13 | Discharge: 2021-06-13 | Disposition: A | Discharge status: Home / Self Care | Payer: PPO | Source: Ambulatory Visit | Attending: Radiation Oncology | Admitting: Radiation Oncology

## 2021-06-13 DIAGNOSIS — C61 Malignant neoplasm of prostate: Secondary | ICD-10-CM | POA: Diagnosis not present

## 2021-06-13 DIAGNOSIS — C7951 Secondary malignant neoplasm of bone: Secondary | ICD-10-CM | POA: Diagnosis not present

## 2021-06-13 DIAGNOSIS — Z510 Encounter for antineoplastic radiation therapy: Secondary | ICD-10-CM | POA: Diagnosis not present

## 2021-06-13 DIAGNOSIS — Z191 Hormone sensitive malignancy status: Secondary | ICD-10-CM | POA: Diagnosis not present

## 2021-06-13 LAB — RAD ONC ARIA SESSION SUMMARY
Course Elapsed Days: 6
Plan Fractions Treated to Date: 5
Plan Prescribed Dose Per Fraction: 3 Gy
Plan Total Fractions Prescribed: 10
Plan Total Prescribed Dose: 30 Gy
Reference Point Dosage Given to Date: 15 Gy
Reference Point Session Dosage Given: 3 Gy
Session Number: 5

## 2021-06-14 ENCOUNTER — Other Ambulatory Visit: Payer: Self-pay

## 2021-06-14 ENCOUNTER — Ambulatory Visit
Admission: RE | Admit: 2021-06-14 | Discharge: 2021-06-14 | Disposition: A | Discharge status: Home / Self Care | Payer: PPO | Source: Ambulatory Visit | Attending: Radiation Oncology | Admitting: Radiation Oncology

## 2021-06-14 DIAGNOSIS — Z510 Encounter for antineoplastic radiation therapy: Secondary | ICD-10-CM | POA: Diagnosis not present

## 2021-06-14 DIAGNOSIS — Z191 Hormone sensitive malignancy status: Secondary | ICD-10-CM | POA: Diagnosis not present

## 2021-06-14 DIAGNOSIS — C7951 Secondary malignant neoplasm of bone: Secondary | ICD-10-CM | POA: Diagnosis not present

## 2021-06-14 DIAGNOSIS — C61 Malignant neoplasm of prostate: Secondary | ICD-10-CM | POA: Diagnosis not present

## 2021-06-14 LAB — RAD ONC ARIA SESSION SUMMARY
Course Elapsed Days: 7
Plan Fractions Treated to Date: 6
Plan Prescribed Dose Per Fraction: 3 Gy
Plan Total Fractions Prescribed: 10
Plan Total Prescribed Dose: 30 Gy
Reference Point Dosage Given to Date: 18 Gy
Reference Point Session Dosage Given: 3 Gy
Session Number: 6

## 2021-06-15 ENCOUNTER — Other Ambulatory Visit: Payer: Self-pay

## 2021-06-15 ENCOUNTER — Ambulatory Visit
Admission: RE | Admit: 2021-06-15 | Discharge: 2021-06-15 | Disposition: A | Discharge status: Home / Self Care | Payer: PPO | Source: Ambulatory Visit | Attending: Radiation Oncology | Admitting: Radiation Oncology

## 2021-06-15 DIAGNOSIS — Z510 Encounter for antineoplastic radiation therapy: Secondary | ICD-10-CM | POA: Diagnosis not present

## 2021-06-15 DIAGNOSIS — Z191 Hormone sensitive malignancy status: Secondary | ICD-10-CM | POA: Diagnosis not present

## 2021-06-15 DIAGNOSIS — C7951 Secondary malignant neoplasm of bone: Secondary | ICD-10-CM | POA: Diagnosis not present

## 2021-06-15 DIAGNOSIS — C61 Malignant neoplasm of prostate: Secondary | ICD-10-CM | POA: Diagnosis not present

## 2021-06-15 LAB — RAD ONC ARIA SESSION SUMMARY
Course Elapsed Days: 8
Plan Fractions Treated to Date: 7
Plan Prescribed Dose Per Fraction: 3 Gy
Plan Total Fractions Prescribed: 10
Plan Total Prescribed Dose: 30 Gy
Reference Point Dosage Given to Date: 21 Gy
Reference Point Session Dosage Given: 3 Gy
Session Number: 7

## 2021-06-18 ENCOUNTER — Ambulatory Visit
Admission: RE | Admit: 2021-06-18 | Discharge: 2021-06-18 | Disposition: A | Discharge status: Home / Self Care | Payer: PPO | Source: Ambulatory Visit | Attending: Radiation Oncology | Admitting: Radiation Oncology

## 2021-06-18 ENCOUNTER — Other Ambulatory Visit: Payer: Self-pay

## 2021-06-18 DIAGNOSIS — Z191 Hormone sensitive malignancy status: Secondary | ICD-10-CM | POA: Diagnosis not present

## 2021-06-18 DIAGNOSIS — C61 Malignant neoplasm of prostate: Secondary | ICD-10-CM | POA: Diagnosis not present

## 2021-06-18 DIAGNOSIS — C7951 Secondary malignant neoplasm of bone: Secondary | ICD-10-CM | POA: Diagnosis not present

## 2021-06-18 DIAGNOSIS — Z510 Encounter for antineoplastic radiation therapy: Secondary | ICD-10-CM | POA: Diagnosis not present

## 2021-06-18 LAB — RAD ONC ARIA SESSION SUMMARY
Course Elapsed Days: 11
Plan Fractions Treated to Date: 8
Plan Prescribed Dose Per Fraction: 3 Gy
Plan Total Fractions Prescribed: 10
Plan Total Prescribed Dose: 30 Gy
Reference Point Dosage Given to Date: 24 Gy
Reference Point Session Dosage Given: 3 Gy
Session Number: 8

## 2021-06-19 ENCOUNTER — Ambulatory Visit
Admission: RE | Admit: 2021-06-19 | Discharge: 2021-06-19 | Disposition: A | Discharge status: Home / Self Care | Payer: PPO | Source: Ambulatory Visit | Attending: Radiation Oncology | Admitting: Radiation Oncology

## 2021-06-19 ENCOUNTER — Other Ambulatory Visit: Payer: Self-pay

## 2021-06-19 DIAGNOSIS — C7951 Secondary malignant neoplasm of bone: Secondary | ICD-10-CM | POA: Diagnosis not present

## 2021-06-19 DIAGNOSIS — Z191 Hormone sensitive malignancy status: Secondary | ICD-10-CM | POA: Diagnosis not present

## 2021-06-19 DIAGNOSIS — Z510 Encounter for antineoplastic radiation therapy: Secondary | ICD-10-CM | POA: Diagnosis not present

## 2021-06-19 DIAGNOSIS — C61 Malignant neoplasm of prostate: Secondary | ICD-10-CM | POA: Diagnosis not present

## 2021-06-19 LAB — RAD ONC ARIA SESSION SUMMARY
Course Elapsed Days: 12
Plan Fractions Treated to Date: 9
Plan Prescribed Dose Per Fraction: 3 Gy
Plan Total Fractions Prescribed: 10
Plan Total Prescribed Dose: 30 Gy
Reference Point Dosage Given to Date: 27 Gy
Reference Point Session Dosage Given: 3 Gy
Session Number: 9

## 2021-06-20 ENCOUNTER — Ambulatory Visit
Admission: RE | Admit: 2021-06-20 | Discharge: 2021-06-20 | Disposition: A | Discharge status: Home / Self Care | Payer: PPO | Source: Ambulatory Visit | Attending: Radiation Oncology | Admitting: Radiation Oncology

## 2021-06-20 ENCOUNTER — Other Ambulatory Visit: Payer: Self-pay

## 2021-06-20 ENCOUNTER — Encounter: Payer: Self-pay | Admitting: Radiation Oncology

## 2021-06-20 DIAGNOSIS — Z191 Hormone sensitive malignancy status: Secondary | ICD-10-CM | POA: Diagnosis not present

## 2021-06-20 DIAGNOSIS — C7951 Secondary malignant neoplasm of bone: Secondary | ICD-10-CM | POA: Diagnosis not present

## 2021-06-20 DIAGNOSIS — Z510 Encounter for antineoplastic radiation therapy: Secondary | ICD-10-CM | POA: Diagnosis not present

## 2021-06-20 DIAGNOSIS — C61 Malignant neoplasm of prostate: Secondary | ICD-10-CM | POA: Diagnosis not present

## 2021-06-20 LAB — RAD ONC ARIA SESSION SUMMARY
Course Elapsed Days: 13
Plan Fractions Treated to Date: 10
Plan Prescribed Dose Per Fraction: 3 Gy
Plan Total Fractions Prescribed: 10
Plan Total Prescribed Dose: 30 Gy
Reference Point Dosage Given to Date: 30 Gy
Reference Point Session Dosage Given: 3 Gy
Session Number: 10

## 2021-06-21 ENCOUNTER — Other Ambulatory Visit (HOSPITAL_COMMUNITY): Payer: Self-pay

## 2021-06-23 ENCOUNTER — Other Ambulatory Visit (HOSPITAL_COMMUNITY): Payer: Self-pay

## 2021-06-29 ENCOUNTER — Other Ambulatory Visit: Payer: Self-pay

## 2021-06-29 ENCOUNTER — Inpatient Hospital Stay: Payer: PPO | Admitting: Oncology

## 2021-06-29 ENCOUNTER — Inpatient Hospital Stay: Payer: PPO | Attending: Oncology

## 2021-06-29 VITALS — BP 144/74 | HR 88 | Temp 97.7°F | Resp 18 | Ht 68.0 in | Wt 213.2 lb

## 2021-06-29 DIAGNOSIS — E2740 Unspecified adrenocortical insufficiency: Secondary | ICD-10-CM | POA: Insufficient documentation

## 2021-06-29 DIAGNOSIS — C7951 Secondary malignant neoplasm of bone: Secondary | ICD-10-CM | POA: Insufficient documentation

## 2021-06-29 DIAGNOSIS — Z7952 Long term (current) use of systemic steroids: Secondary | ICD-10-CM | POA: Insufficient documentation

## 2021-06-29 DIAGNOSIS — C61 Malignant neoplasm of prostate: Secondary | ICD-10-CM

## 2021-06-29 DIAGNOSIS — Z9221 Personal history of antineoplastic chemotherapy: Secondary | ICD-10-CM | POA: Insufficient documentation

## 2021-06-29 DIAGNOSIS — Z79899 Other long term (current) drug therapy: Secondary | ICD-10-CM | POA: Insufficient documentation

## 2021-06-29 DIAGNOSIS — Z923 Personal history of irradiation: Secondary | ICD-10-CM | POA: Insufficient documentation

## 2021-06-29 DIAGNOSIS — I10 Essential (primary) hypertension: Secondary | ICD-10-CM | POA: Insufficient documentation

## 2021-06-29 LAB — CMP (CANCER CENTER ONLY)
ALT: 31 U/L (ref 0–44)
AST: 22 U/L (ref 15–41)
Albumin: 4.2 g/dL (ref 3.5–5.0)
Alkaline Phosphatase: 112 U/L (ref 38–126)
Anion gap: 7 (ref 5–15)
BUN: 18 mg/dL (ref 8–23)
CO2: 26 mmol/L (ref 22–32)
Calcium: 9.6 mg/dL (ref 8.9–10.3)
Chloride: 109 mmol/L (ref 98–111)
Creatinine: 0.83 mg/dL (ref 0.61–1.24)
GFR, Estimated: >60 mL/min (ref >60)
Glucose, Bld: 136 mg/dL — ABNORMAL HIGH (ref 70–99)
Potassium: 4.5 mmol/L (ref 3.5–5.1)
Sodium: 142 mmol/L (ref 135–145)
Total Bilirubin: 0.6 mg/dL (ref 0.3–1.2)
Total Protein: 7.2 g/dL (ref 6.5–8.1)

## 2021-06-29 LAB — CBC WITH DIFFERENTIAL (CANCER CENTER ONLY)
Abs Immature Granulocytes: 0.02 10*3/uL (ref 0.00–0.07)
Basophils Absolute: 0 10*3/uL (ref 0.0–0.1)
Basophils Relative: 1 %
Eosinophils Absolute: 0.1 10*3/uL (ref 0.0–0.5)
Eosinophils Relative: 2 %
HCT: 36.4 % — ABNORMAL LOW (ref 39.0–52.0)
Hemoglobin: 12.3 g/dL — ABNORMAL LOW (ref 13.0–17.0)
Immature Granulocytes: 0 %
Lymphocytes Relative: 9 %
Lymphs Abs: 0.6 10*3/uL — ABNORMAL LOW (ref 0.7–4.0)
MCH: 32.5 pg (ref 26.0–34.0)
MCHC: 33.8 g/dL (ref 30.0–36.0)
MCV: 96 fL (ref 80.0–100.0)
Monocytes Absolute: 0.4 10*3/uL (ref 0.1–1.0)
Monocytes Relative: 6 %
Neutro Abs: 5.4 10*3/uL (ref 1.7–7.7)
Neutrophils Relative %: 82 %
Platelet Count: 153 10*3/uL (ref 150–400)
RBC: 3.79 MIL/uL — ABNORMAL LOW (ref 4.22–5.81)
RDW: 14.2 % (ref 11.5–15.5)
WBC Count: 6.5 10*3/uL (ref 4.0–10.5)
nRBC: 0 % (ref 0.0–0.2)

## 2021-06-29 NOTE — Progress Notes (Signed)
Hematology and Oncology Follow Up Visit  Stephen Collins [id number] June 01, 1945 76 y.o. 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  He presented in 2021 with localized disease.  Prior Therapy: He was on active surveillance and did not receive any additional treatment.  His PSA increased to 83.2 in September 2022 and a PET scan showed diffuse involvement.  Repeat prostate biopsy on November 27, 2020 showed a Gleason score 4+5 = 9.  Eligard 22.5 mg every 3 months started on February 02, 2021.  Taxotere chemotherapy 75 mg per metered squared started on January 09, 2021.  The dose was changed to 60 mg per metered square starting with cycle 4 of therapy.  He completed 6 cycles of therapy in March 2023.  He is status post radiation therapy to the L5 and sacral region.  Completed 30 Gray in 10 fractions in May 2023.  Current therapy:  Eligard 30 mg every 4 months with the last injection given on May 18, 2021.  This will be repeated in August 2023.  Zytiga 1000 mg daily with prednisone 5 mg daily started in April 2023.  Interim History: Stephen Collins returns today for a follow-up visit.  Since last visit, he completed radiation therapy to the lumbar spine as well is the sacral lesion with improvement in his pain.  He also tolerated the first month of Zytiga without any major complaints.  He does report some fatigue and tiredness but overall his symptoms has improved since discontinuation of chemotherapy.  He denies any residual neuropathy or shortness of breath.  He continues to have unsteadiness and decrease in his balance and stamina.    Medications: Updated on review. Current Outpatient Medications  Medication Sig Dispense Refill   abiraterone acetate (ZYTIGA) 250 MG tablet Take 4 tablets (1,000 mg total) by mouth  daily. Take on an empty stomach 1 hour before or 2 hours after a meal 120 tablet 0   buPROPion (WELLBUTRIN XL) 150 MG 24 hr tablet Take 150 mg by mouth daily.     CALCIUM PO Take 1,200 mg by mouth daily.     cetirizine (ZYRTEC) 10 MG tablet Take 10 mg by mouth daily as needed for allergies.     cholecalciferol (VITAMIN D3) 25 MCG (1000 UNIT) tablet Take 1,000 Units by mouth daily.     irbesartan-hydrochlorothiazide (AVALIDE) 300-12.5 MG tablet Take 1 tablet by mouth every morning.      lidocaine-prilocaine (EMLA) cream Apply 1 application topically as needed. 30 g 0   Multiple Vitamins-Minerals (ADULT GUMMY PO) Take 2 capsules by mouth daily.     naproxen sodium (ALEVE) 220 MG tablet Take 220 mg by mouth daily as needed (pain).     predniSONE (DELTASONE) 5 MG tablet Take 1 tablet (5 mg total) by mouth daily with breakfast. 90 tablet 3   prochlorperazine (COMPAZINE) 10 MG tablet TAKE 1 TABLET(10 MG) BY MOUTH EVERY 6 HOURS AS NEEDED FOR NAUSEA OR VOMITING 30 tablet 0   rosuvastatin (CRESTOR) 20 MG tablet Take 20 mg by mouth daily.     No current facility-administered medications for this visit.       Physical Exam:  Blood pressure (!) 144/74, pulse 88, temperature 97.7 F (36.5 C), temperature source Temporal, resp. rate 18, height '5\' 8"'$  (1.727 m), weight 213 lb 3.2 oz (96.7 kg), SpO2 100 %.  ECOG: 1    General appearance: Comfortable appearing without any discomfort Head: Normocephalic without any trauma Oropharynx: Mucous membranes are moist and pink without any thrush or ulcers. Eyes: Pupils are equal and round reactive to light. Lymph nodes: No cervical, supraclavicular, inguinal or axillary lymphadenopathy.   Heart:regular rate and rhythm.  S1 and S2 without leg edema. Lung: Clear without any rhonchi or wheezes.  No dullness to percussion. Abdomin: Soft, nontender, nondistended with good bowel sounds.  No hepatosplenomegaly. Musculoskeletal: No joint deformity or  effusion.  Full range of motion noted. Neurological: No deficits noted on motor, sensory and deep tendon reflex exam. Skin: No petechial rash or dryness.  Appeared moist.         Lab Results: Lab Results  Component Value Date   WBC 6.5 06/29/2021   HGB 12.3 (L) 06/29/2021   HCT 36.4 (L) 06/29/2021   MCV 96.0 06/29/2021   PLT 153 06/29/2021     Chemistry      Component Value Date/Time   NA 141 05/15/2021 1116   K 4.0 05/15/2021 1116   CL 110 05/15/2021 1116   CO2 25 05/15/2021 1116   BUN 24 (H) 05/15/2021 1116   CREATININE 0.71 05/15/2021 1116      Component Value Date/Time   CALCIUM 9.5 05/15/2021 1116   ALKPHOS 84 05/15/2021 1116   AST 18 05/15/2021 1116   ALT 24 05/15/2021 1116   BILITOT 0.5 05/15/2021 1116      Latest Reference Range & Units 04/03/21 11:02 04/25/21 11:29 05/15/21 11:16  Prostate Specific Ag, Serum 0.0 - 4.0 ng/mL 5.2 (H) 4.9 (H) 9.5 (H)  (H): Data is abnormally high   Impression and Plan:  76 year old with:  1.  Castration-resistant advanced prostate cancer with disease to the bone and lymphadenopathy diagnosed in October 2022.     He is currently on Zytiga with prednisone with overall reasonable tolerance.  His PSA is currently pending after her recent rise on chemotherapy.  Risks and benefits of continuing this treatment were reviewed at this time.  Complications including hypertension, fatigue and adrenal insufficiency were reiterated.  Alternative treatment options including Jevtana chemotherapy versus other oral targeted therapy were reiterated.  At this time he is agreeable to continue and will monitor his PSA for the time being.    2.  IV access: Port-A-Cath will remain in place and flushed periodically.   3.  Bone pain: Improved after radiation therapy.   4.  Androgen deprivation therapy: This will be repeated in August 2023.   5.  Goals of care: His disease is incurable although aggressive measures are warranted given his  reasonable performance status.   6.  Bone directed therapy: He will continue calcium and vitamin D supplements for the time being.   7.  Follow-up: In 1 month for repeat evaluation.   30  minutes were dedicated to this visit.  The time was spent on reviewing laboratory data, disease status update and addressing complication related to cancer and cancer therapy.  Zola Button, MD 5/19/20232:51 PM

## 2021-06-30 LAB — PROSTATE-SPECIFIC AG, SERUM (LABCORP): Prostate Specific Ag, Serum: 5 ng/mL — ABNORMAL HIGH (ref 0.0–4.0)

## 2021-07-02 ENCOUNTER — Other Ambulatory Visit (HOSPITAL_COMMUNITY): Payer: Self-pay

## 2021-07-02 ENCOUNTER — Telehealth: Payer: Self-pay | Admitting: *Deleted

## 2021-07-02 NOTE — Telephone Encounter (Signed)
PC to patient, informed him his PSA is 5.0, he verbalizes understanding.

## 2021-07-02 NOTE — Telephone Encounter (Signed)
-----   Message from [name], MD sent at 07/02/2021  8:24 AM EDT ----- Please let him know his PSA is down

## 2021-07-12 ENCOUNTER — Telehealth: Payer: Self-pay | Admitting: Oncology

## 2021-07-12 NOTE — Telephone Encounter (Signed)
Scheduled per 05/19 los, patient has been called and notified. 

## 2021-07-13 ENCOUNTER — Other Ambulatory Visit (HOSPITAL_COMMUNITY): Payer: Self-pay

## 2021-07-16 ENCOUNTER — Other Ambulatory Visit: Payer: Self-pay | Admitting: Oncology

## 2021-07-16 ENCOUNTER — Other Ambulatory Visit (HOSPITAL_COMMUNITY): Payer: Self-pay

## 2021-07-17 ENCOUNTER — Other Ambulatory Visit: Payer: Self-pay | Admitting: *Deleted

## 2021-07-17 ENCOUNTER — Other Ambulatory Visit (HOSPITAL_COMMUNITY): Payer: Self-pay

## 2021-07-17 ENCOUNTER — Encounter: Payer: Self-pay | Admitting: Oncology

## 2021-07-17 DIAGNOSIS — C61 Malignant neoplasm of prostate: Secondary | ICD-10-CM

## 2021-07-17 MED ORDER — ABIRATERONE ACETATE 250 MG PO TABS
1000.0000 mg | ORAL_TABLET | Freq: Every day | ORAL | 0 refills | Status: DC
  Filled 2021-07-17: qty 120, 30d supply, fill #0

## 2021-07-18 ENCOUNTER — Other Ambulatory Visit: Payer: Self-pay | Admitting: Nurse Practitioner

## 2021-07-19 ENCOUNTER — Other Ambulatory Visit (HOSPITAL_COMMUNITY): Payer: Self-pay

## 2021-07-23 ENCOUNTER — Other Ambulatory Visit (HOSPITAL_COMMUNITY): Payer: Self-pay

## 2021-08-07 ENCOUNTER — Inpatient Hospital Stay: Payer: PPO | Attending: Oncology

## 2021-08-07 ENCOUNTER — Other Ambulatory Visit: Payer: Self-pay

## 2021-08-07 ENCOUNTER — Inpatient Hospital Stay (HOSPITAL_BASED_OUTPATIENT_CLINIC_OR_DEPARTMENT_OTHER): Payer: PPO | Admitting: Oncology

## 2021-08-07 VITALS — BP 152/76 | HR 86 | Temp 98.1°F | Resp 17 | Ht 68.0 in | Wt 210.2 lb

## 2021-08-07 DIAGNOSIS — Z923 Personal history of irradiation: Secondary | ICD-10-CM | POA: Diagnosis not present

## 2021-08-07 DIAGNOSIS — C61 Malignant neoplasm of prostate: Secondary | ICD-10-CM

## 2021-08-07 DIAGNOSIS — I10 Essential (primary) hypertension: Secondary | ICD-10-CM | POA: Insufficient documentation

## 2021-08-07 DIAGNOSIS — C7951 Secondary malignant neoplasm of bone: Secondary | ICD-10-CM | POA: Insufficient documentation

## 2021-08-07 DIAGNOSIS — Z7952 Long term (current) use of systemic steroids: Secondary | ICD-10-CM | POA: Diagnosis not present

## 2021-08-07 DIAGNOSIS — Z79899 Other long term (current) drug therapy: Secondary | ICD-10-CM | POA: Diagnosis not present

## 2021-08-07 DIAGNOSIS — Z9221 Personal history of antineoplastic chemotherapy: Secondary | ICD-10-CM | POA: Diagnosis not present

## 2021-08-07 LAB — CBC WITH DIFFERENTIAL (CANCER CENTER ONLY)
Abs Immature Granulocytes: 0.04 10*3/uL (ref 0.00–0.07)
Basophils Absolute: 0 10*3/uL (ref 0.0–0.1)
Basophils Relative: 0 %
Eosinophils Absolute: 0.1 10*3/uL (ref 0.0–0.5)
Eosinophils Relative: 1 %
HCT: 36.4 % — ABNORMAL LOW (ref 39.0–52.0)
Hemoglobin: 12.6 g/dL — ABNORMAL LOW (ref 13.0–17.0)
Immature Granulocytes: 1 %
Lymphocytes Relative: 8 %
Lymphs Abs: 0.6 10*3/uL — ABNORMAL LOW (ref 0.7–4.0)
MCH: 32.6 pg (ref 26.0–34.0)
MCHC: 34.6 g/dL (ref 30.0–36.0)
MCV: 94.3 fL (ref 80.0–100.0)
Monocytes Absolute: 0.5 10*3/uL (ref 0.1–1.0)
Monocytes Relative: 6 %
Neutro Abs: 6.6 10*3/uL (ref 1.7–7.7)
Neutrophils Relative %: 84 %
Platelet Count: 160 10*3/uL (ref 150–400)
RBC: 3.86 MIL/uL — ABNORMAL LOW (ref 4.22–5.81)
RDW: 14.4 % (ref 11.5–15.5)
WBC Count: 7.8 10*3/uL (ref 4.0–10.5)
nRBC: 0 % (ref 0.0–0.2)

## 2021-08-07 LAB — CMP (CANCER CENTER ONLY)
ALT: 27 U/L (ref 0–44)
AST: 17 U/L (ref 15–41)
Albumin: 4.2 g/dL (ref 3.5–5.0)
Alkaline Phosphatase: 82 U/L (ref 38–126)
Anion gap: 6 (ref 5–15)
BUN: 19 mg/dL (ref 8–23)
CO2: 25 mmol/L (ref 22–32)
Calcium: 9.6 mg/dL (ref 8.9–10.3)
Chloride: 108 mmol/L (ref 98–111)
Creatinine: 0.76 mg/dL (ref 0.61–1.24)
GFR, Estimated: >60 mL/min (ref >60)
Glucose, Bld: 127 mg/dL — ABNORMAL HIGH (ref 70–99)
Potassium: 3.7 mmol/L (ref 3.5–5.1)
Sodium: 139 mmol/L (ref 135–145)
Total Bilirubin: 0.6 mg/dL (ref 0.3–1.2)
Total Protein: 6.8 g/dL (ref 6.5–8.1)

## 2021-08-07 NOTE — Progress Notes (Signed)
Hematology and Oncology Follow Up Visit  Stephen Collins [id number] Mar 30, 1945 76 y.o. 08/07/2021 1:25 PM Stephen Collins, Stephen Muscat, MD   Principle Diagnosis: 76 year old with man with advanced prostate cancer with disease to the bone and lymphadenopathy diagnosed in October 2022.  He has castration-resistant after presenting with localized disease in 2021.  Prior Therapy: He was on active surveillance and did not receive any additional treatment.  His PSA increased to 83.2 in September 2022 and a PET scan showed diffuse involvement.  Repeat prostate biopsy on November 27, 2020 showed a Gleason score 4+5 = 9.  Eligard 22.5 mg every 3 months started on February 02, 2021.  Taxotere chemotherapy 75 mg per metered squared started on January 09, 2021.  The dose was changed to 60 mg per metered square starting with cycle 4 of therapy.  He completed 6 cycles of therapy in March 2023.  He is status post radiation therapy to the L5 and sacral region.  Completed 30 Gray in 10 fractions in May 2023.  Current therapy:  Eligard 30 mg every 4 months with the last injection given on May 18, 2021.  Next injection will be after September 17, 2021.  Zytiga 1000 mg daily with prednisone 5 mg daily started in April 2023.  Interim History: Stephen Collins is here for a follow-up evaluation.  Since last visit, he reports no major changes in his health.  He still has some residual complications related to chemotherapy including fatigue tiredness and occasional weakness.  He still quite ambulatory without any decline in his performance status.  He denies any complications related to Zytiga.  He had denies any leg edema or pathological fractures.  Performance status quality of life is unchanged.    Medications: Reviewed without changes. Current Outpatient Medications  Medication Sig Dispense Refill   abiraterone acetate (ZYTIGA) 250 MG tablet Take 4 tablets (1,000 mg total) by mouth daily. Take on an empty  stomach 1 hour before or 2 hours after a meal 120 tablet 0   buPROPion (WELLBUTRIN XL) 150 MG 24 hr tablet Take 150 mg by mouth daily.     CALCIUM PO Take 1,200 mg by mouth daily.     cetirizine (ZYRTEC) 10 MG tablet Take 10 mg by mouth daily as needed for allergies.     cholecalciferol (VITAMIN D3) 25 MCG (1000 UNIT) tablet Take 1,000 Units by mouth daily.     irbesartan-hydrochlorothiazide (AVALIDE) 300-12.5 MG tablet Take 1 tablet by mouth every morning.      lidocaine-prilocaine (EMLA) cream Apply 1 application topically as needed. 30 g 0   Multiple Vitamins-Minerals (ADULT GUMMY PO) Take 2 capsules by mouth daily.     naproxen sodium (ALEVE) 220 MG tablet Take 220 mg by mouth daily as needed (pain).     predniSONE (DELTASONE) 5 MG tablet Take 1 tablet (5 mg total) by mouth daily with breakfast. 90 tablet 3   prochlorperazine (COMPAZINE) 10 MG tablet TAKE 1 TABLET(10 MG) BY MOUTH EVERY 6 HOURS AS NEEDED FOR NAUSEA OR VOMITING 30 tablet 0   rosuvastatin (CRESTOR) 20 MG tablet Take 20 mg by mouth daily.     No current facility-administered medications for this visit.       Physical Exam:    Blood pressure (!) 152/76, pulse 86, temperature 98.1 F (36.7 C), temperature source Temporal, resp. rate 17, height 5\' 8"  (1.727 m), weight 210 lb 3.2 oz (95.3 kg), SpO2 98 %.    ECOG: 1    General appearance:  Alert, awake without any distress. Head: Atraumatic without abnormalities Oropharynx: Without any thrush or ulcers. Eyes: No scleral icterus. Lymph nodes: No lymphadenopathy noted in the cervical, supraclavicular, or axillary nodes Heart:regular rate and rhythm, without any murmurs or gallops.   Lung: Clear to auscultation without any rhonchi, wheezes or dullness to percussion. Abdomin: Soft, nontender without any shifting dullness or ascites. Musculoskeletal: No clubbing or cyanosis. Neurological: No motor or sensory deficits. Skin: No rashes or lesions.         Lab  Results: Lab Results  Component Value Date   WBC 7.8 08/07/2021   HGB 12.6 (L) 08/07/2021   HCT 36.4 (L) 08/07/2021   MCV 94.3 08/07/2021   PLT 160 08/07/2021     Chemistry      Component Value Date/Time   NA 142 06/29/2021 1424   K 4.5 06/29/2021 1424   CL 109 06/29/2021 1424   CO2 26 06/29/2021 1424   BUN 18 06/29/2021 1424   CREATININE 0.83 06/29/2021 1424      Component Value Date/Time   CALCIUM 9.6 06/29/2021 1424   ALKPHOS 112 06/29/2021 1424   AST 22 06/29/2021 1424   ALT 31 06/29/2021 1424   BILITOT 0.6 06/29/2021 1424      Latest Reference Range & Units 04/03/21 11:02 04/25/21 11:29 05/15/21 11:16 06/29/21 14:24  Prostate Specific Ag, Serum 0.0 - 4.0 ng/mL 5.2 (H) 4.9 (H) 9.5 (H) 5.0 (H)    Impression and Plan:  76 year old with:  1.  Advanced prostate cancer with disease to the bone diagnosed in October 2022.  He has castration-resistant currently.  His PSA showed an excellent response to Stephen Collins after Taxotere chemotherapy.  Complications that include hypertension, weight gain and edema were reiterated.  He is agreeable to continue at this time given his reasonable response and tolerance.  Alternative treatment options including chemotherapy, Pluvicto as well as part 50 patient he has the appropriate mutation.    2.  IV access: Port-A-Cath will continue to be flushed periodically.   3.  Bone pain: Manageable at this time does not require any pain medication.  He completed radiation therapy to the lower back.   4.  Androgen deprivation therapy: He is currently on Eligard which will continue indefinitely.  Next injection will be given in August 2023.   5.  Goals of care: His disease is incurable although aggressive measures are warranted given his excellent performance status.   6.  Bone directed therapy: Risks and benefits of starting Xgeva were reiterated at this time.  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  I recommended obtaining  dental clearance before proceeding with treatments.  His calcium is adequate.  He will consider this option and have a dental evaluation before tentatively starting Xgeva with the next visit.  He is already on calcium and vitamin D supplements.   7.  Follow-up: In 6 weeks for repeat follow-up.   30  minutes were spent on this encounter.  The time was dedicated to reviewing laboratory data, disease status update and outlining future plan of care discussion.  Eli Hose, MD 6/27/20231:25 PM

## 2021-08-08 ENCOUNTER — Other Ambulatory Visit: Payer: Self-pay | Admitting: Oncology

## 2021-08-08 ENCOUNTER — Telehealth: Payer: Self-pay | Admitting: *Deleted

## 2021-08-08 ENCOUNTER — Other Ambulatory Visit (HOSPITAL_COMMUNITY): Payer: Self-pay

## 2021-08-08 DIAGNOSIS — C61 Malignant neoplasm of prostate: Secondary | ICD-10-CM

## 2021-08-08 LAB — PROSTATE-SPECIFIC AG, SERUM (LABCORP): Prostate Specific Ag, Serum: 4.3 ng/mL — ABNORMAL HIGH (ref 0.0–4.0)

## 2021-08-08 MED ORDER — ABIRATERONE ACETATE 250 MG PO TABS
1000.0000 mg | ORAL_TABLET | Freq: Every day | ORAL | 0 refills | Status: DC
  Filled 2021-08-08: qty 120, 30d supply, fill #0

## 2021-08-08 NOTE — Telephone Encounter (Addendum)
Contacted patient per Dr Hazeline Junker directions below. Patient verbalized understanding of information and thanked caller.Marland Kitchen   ----- Message from [name], MD sent at 08/08/2021  2:54 PM EDT ----- Please let him know his PSA is down

## 2021-08-16 ENCOUNTER — Other Ambulatory Visit (HOSPITAL_COMMUNITY): Payer: Self-pay

## 2021-08-17 ENCOUNTER — Telehealth: Payer: Self-pay | Admitting: Oncology

## 2021-08-17 NOTE — Telephone Encounter (Signed)
Called patient regarding upcoming August appointments, patient has been called and notified. 

## 2021-08-21 ENCOUNTER — Telehealth: Payer: Self-pay | Admitting: *Deleted

## 2021-08-21 NOTE — Telephone Encounter (Signed)
Notified of message below from Dr Alen Blew. Pt states he will go to the ED if it gets worse.

## 2021-08-21 NOTE — Telephone Encounter (Signed)
Mr Stephen Collins states he woke up this morning with his sternum hurting. States it hurts when is pushes on it, continues to have some low back pain. He denies any other symptoms, no SOB, no cardiac issues. Was thinking it is related to the cancer. Wanted to know Dr Alen Blew thoughts.

## 2021-08-30 ENCOUNTER — Telehealth: Payer: Self-pay | Admitting: Oncology

## 2021-08-30 NOTE — Telephone Encounter (Signed)
.  Called patient to schedule appointment per 7/19 inbasket, patient is aware of date and time.   

## 2021-09-04 ENCOUNTER — Encounter: Payer: Self-pay | Admitting: Oncology

## 2021-09-06 ENCOUNTER — Other Ambulatory Visit: Payer: Self-pay | Admitting: Oncology

## 2021-09-06 ENCOUNTER — Other Ambulatory Visit (HOSPITAL_COMMUNITY): Payer: Self-pay

## 2021-09-06 DIAGNOSIS — C61 Malignant neoplasm of prostate: Secondary | ICD-10-CM

## 2021-09-06 MED ORDER — ABIRATERONE ACETATE 250 MG PO TABS
1000.0000 mg | ORAL_TABLET | Freq: Every day | ORAL | 0 refills | Status: DC
  Filled 2021-09-06: qty 120, 30d supply, fill #0

## 2021-09-11 ENCOUNTER — Other Ambulatory Visit: Payer: Self-pay

## 2021-09-11 ENCOUNTER — Inpatient Hospital Stay: Payer: PPO

## 2021-09-11 ENCOUNTER — Inpatient Hospital Stay: Payer: PPO | Admitting: Oncology

## 2021-09-11 ENCOUNTER — Inpatient Hospital Stay: Payer: PPO | Attending: Oncology

## 2021-09-11 VITALS — BP 154/84 | HR 102 | Temp 98.2°F | Resp 19 | Ht 68.0 in | Wt 213.2 lb

## 2021-09-11 DIAGNOSIS — C61 Malignant neoplasm of prostate: Secondary | ICD-10-CM | POA: Diagnosis not present

## 2021-09-11 DIAGNOSIS — Z79899 Other long term (current) drug therapy: Secondary | ICD-10-CM | POA: Diagnosis not present

## 2021-09-11 DIAGNOSIS — I10 Essential (primary) hypertension: Secondary | ICD-10-CM | POA: Diagnosis not present

## 2021-09-11 DIAGNOSIS — Z7952 Long term (current) use of systemic steroids: Secondary | ICD-10-CM | POA: Diagnosis not present

## 2021-09-11 DIAGNOSIS — Z95828 Presence of other vascular implants and grafts: Secondary | ICD-10-CM

## 2021-09-11 DIAGNOSIS — C7951 Secondary malignant neoplasm of bone: Secondary | ICD-10-CM | POA: Insufficient documentation

## 2021-09-11 DIAGNOSIS — Z923 Personal history of irradiation: Secondary | ICD-10-CM | POA: Insufficient documentation

## 2021-09-11 DIAGNOSIS — Z9221 Personal history of antineoplastic chemotherapy: Secondary | ICD-10-CM | POA: Diagnosis not present

## 2021-09-11 LAB — CMP (CANCER CENTER ONLY)
ALT: 24 U/L (ref 0–44)
AST: 19 U/L (ref 15–41)
Albumin: 4.4 g/dL (ref 3.5–5.0)
Alkaline Phosphatase: 106 U/L (ref 38–126)
Anion gap: 6 (ref 5–15)
BUN: 23 mg/dL (ref 8–23)
CO2: 25 mmol/L (ref 22–32)
Calcium: 9.6 mg/dL (ref 8.9–10.3)
Chloride: 108 mmol/L (ref 98–111)
Creatinine: 0.83 mg/dL (ref 0.61–1.24)
GFR, Estimated: >60 mL/min (ref >60)
Glucose, Bld: 161 mg/dL — ABNORMAL HIGH (ref 70–99)
Potassium: 4 mmol/L (ref 3.5–5.1)
Sodium: 139 mmol/L (ref 135–145)
Total Bilirubin: 0.5 mg/dL (ref 0.3–1.2)
Total Protein: 7.3 g/dL (ref 6.5–8.1)

## 2021-09-11 LAB — CBC WITH DIFFERENTIAL (CANCER CENTER ONLY)
Abs Immature Granulocytes: 0.03 10*3/uL (ref 0.00–0.07)
Basophils Absolute: 0 10*3/uL (ref 0.0–0.1)
Basophils Relative: 0 %
Eosinophils Absolute: 0.1 10*3/uL (ref 0.0–0.5)
Eosinophils Relative: 1 %
HCT: 36.5 % — ABNORMAL LOW (ref 39.0–52.0)
Hemoglobin: 12.4 g/dL — ABNORMAL LOW (ref 13.0–17.0)
Immature Granulocytes: 0 %
Lymphocytes Relative: 8 %
Lymphs Abs: 0.7 10*3/uL (ref 0.7–4.0)
MCH: 32 pg (ref 26.0–34.0)
MCHC: 34 g/dL (ref 30.0–36.0)
MCV: 94.1 fL (ref 80.0–100.0)
Monocytes Absolute: 0.5 10*3/uL (ref 0.1–1.0)
Monocytes Relative: 6 %
Neutro Abs: 7.2 10*3/uL (ref 1.7–7.7)
Neutrophils Relative %: 85 %
Platelet Count: 181 10*3/uL (ref 150–400)
RBC: 3.88 MIL/uL — ABNORMAL LOW (ref 4.22–5.81)
RDW: 14.6 % (ref 11.5–15.5)
WBC Count: 8.5 10*3/uL (ref 4.0–10.5)
nRBC: 0 % (ref 0.0–0.2)

## 2021-09-11 MED ORDER — SODIUM CHLORIDE 0.9% FLUSH
10.0000 mL | Freq: Once | INTRAVENOUS | Status: AC
  Administered 2021-09-11: 10 mL

## 2021-09-11 MED ORDER — HEPARIN SOD (PORK) LOCK FLUSH 100 UNIT/ML IV SOLN
500.0000 [IU] | Freq: Once | INTRAVENOUS | Status: AC
  Administered 2021-09-11: 500 [IU]

## 2021-09-11 MED ORDER — DIPHENHYDRAMINE HCL 50 MG/ML IJ SOLN: 25.0000 mg | Freq: Once | INTRAMUSCULAR | Status: DC | PRN

## 2021-09-11 MED ORDER — LEUPROLIDE ACETATE (4 MONTH) 30 MG ~~LOC~~ KIT
30.0000 mg | PACK | Freq: Once | SUBCUTANEOUS | Status: AC
  Administered 2021-09-11: 30 mg via SUBCUTANEOUS
  Filled 2021-09-11: qty 30

## 2021-09-11 MED ORDER — EPINEPHRINE 0.3 MG/0.3ML IJ SOAJ: 0.3000 mg | Freq: Once | INTRAMUSCULAR | Status: DC | PRN

## 2021-09-11 MED ORDER — ALBUTEROL SULFATE (2.5 MG/3ML) 0.083% IN NEBU: 2.5000 mg | INHALATION_SOLUTION | Freq: Once | RESPIRATORY_TRACT | Status: AC | PRN

## 2021-09-11 MED ORDER — SODIUM CHLORIDE 0.9 % IV SOLN: Freq: Once | INTRAVENOUS | Status: AC | PRN

## 2021-09-11 MED ORDER — METHYLPREDNISOLONE SODIUM SUCC 125 MG IJ SOLR: 125.0000 mg | Freq: Once | INTRAMUSCULAR | Status: DC | PRN

## 2021-09-11 MED ORDER — DENOSUMAB 120 MG/1.7ML ~~LOC~~ SOLN
120.0000 mg | Freq: Once | SUBCUTANEOUS | Status: AC
  Administered 2021-09-11: 120 mg via SUBCUTANEOUS
  Filled 2021-09-11: qty 1.7

## 2021-09-11 MED ORDER — LEUPROLIDE ACETATE (4 MONTH) 30 MG IM KIT: 30.0000 mg | PACK | Freq: Once | INTRAMUSCULAR | Status: DC

## 2021-09-11 MED ORDER — DIPHENHYDRAMINE HCL 50 MG/ML IJ SOLN: 50.0000 mg | Freq: Once | INTRAMUSCULAR | Status: DC | PRN

## 2021-09-11 NOTE — Progress Notes (Signed)
Hematology and Oncology Follow Up Visit  Stephen Collins [id number] 1945/09/23 76 y.o. 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  He presented with localized disease 2021.  Prior Therapy: He was on active surveillance and did not receive any additional treatment.  His PSA increased to 83.2 in September 2022 and a PET scan showed diffuse involvement.  Repeat prostate biopsy on November 27, 2020 showed a Gleason score 4+5 = 9.  Eligard 22.5 mg every 3 months started on February 02, 2021.  Taxotere chemotherapy 75 mg per metered squared started on January 09, 2021.  The dose was changed to 60 mg per metered square starting with cycle 4 of therapy.  He completed 6 cycles of therapy in March 2023.  He is status post radiation therapy to the L5 and sacral region.  Completed 30 Gray in 10 fractions in May 2023.  Current therapy:  Eligard 30 mg every 4 months with the last injection given on May 18, 2021.  He will receive injection today.  Zytiga 1000 mg daily with prednisone 5 mg daily started in April 2023.  Interim History: Stephen Collins returns today for follow-up.  Since last visit, he reports no major changes in his health.  He denies any complications related to Zytiga.  He still has some residual discomfort and instability in his lower extremities.  He denies any nausea, vomiting or worsening bone pain.  He denies any recent falls or syncope.    Medications: Updated on review. Current Outpatient Medications  Medication Sig Dispense Refill   abiraterone acetate (ZYTIGA) 250 MG tablet Take 4 tablets (1,000 mg total) by mouth daily. Take on an empty stomach 1 hour before or 2 hours after a meal 120 tablet 0   buPROPion (WELLBUTRIN XL) 150 MG 24 hr tablet Take 150 mg by mouth daily.     CALCIUM PO Take 1,200 mg by mouth daily.      cetirizine (ZYRTEC) 10 MG tablet Take 10 mg by mouth daily as needed for allergies.     cholecalciferol (VITAMIN D3) 25 MCG (1000 UNIT) tablet Take 1,000 Units by mouth daily.     irbesartan-hydrochlorothiazide (AVALIDE) 300-12.5 MG tablet Take 1 tablet by mouth every morning.      lidocaine-prilocaine (EMLA) cream Apply 1 application topically as needed. 30 g 0   Multiple Vitamins-Minerals (ADULT GUMMY PO) Take 2 capsules by mouth daily.     naproxen sodium (ALEVE) 220 MG tablet Take 220 mg by mouth daily as needed (pain).     predniSONE (DELTASONE) 5 MG tablet Take 1 tablet (5 mg total) by mouth daily with breakfast. 90 tablet 3   prochlorperazine (COMPAZINE) 10 MG tablet TAKE 1 TABLET(10 MG) BY MOUTH EVERY 6 HOURS AS NEEDED FOR NAUSEA OR VOMITING 30 tablet 0   rosuvastatin (CRESTOR) 20 MG tablet Take 20 mg by mouth daily.     No current facility-administered medications for this visit.       Physical Exam:   Blood pressure (!) 154/84, pulse (!) 102, temperature 98.2 F (36.8 C), temperature source Oral, resp. rate 19, height '5\' 8"'$  (1.727 m), weight 213 lb 3.2 oz (96.7 kg), SpO2 97 %.      ECOG: 1     General appearance: Comfortable appearing without any discomfort Head: Normocephalic without any trauma Oropharynx: Mucous membranes are moist and pink without any thrush or ulcers. Eyes: Pupils  are equal and round reactive to light. Lymph nodes: No cervical, supraclavicular, inguinal or axillary lymphadenopathy.   Heart:regular rate and rhythm.  S1 and S2 without leg edema. Lung: Clear without any rhonchi or wheezes.  No dullness to percussion. Abdomin: Soft, nontender, nondistended with good bowel sounds.  No hepatosplenomegaly. Musculoskeletal: No joint deformity or effusion.  Full range of motion noted. Neurological: No deficits noted on motor, sensory and deep tendon reflex exam. Skin: No petechial rash or dryness.  Appeared moist.          Lab Results: Lab  Results  Component Value Date   WBC 7.8 08/07/2021   HGB 12.6 (L) 08/07/2021   HCT 36.4 (L) 08/07/2021   MCV 94.3 08/07/2021   PLT 160 08/07/2021     Chemistry      Component Value Date/Time   NA 139 08/07/2021 1309   K 3.7 08/07/2021 1309   CL 108 08/07/2021 1309   CO2 25 08/07/2021 1309   BUN 19 08/07/2021 1309   CREATININE 0.76 08/07/2021 1309      Component Value Date/Time   CALCIUM 9.6 08/07/2021 1309   ALKPHOS 82 08/07/2021 1309   AST 17 08/07/2021 1309   ALT 27 08/07/2021 1309   BILITOT 0.6 08/07/2021 1309       Latest Reference Range & Units 05/15/21 11:16 06/29/21 14:24 08/07/21 13:09  Prostate Specific Ag, Serum 0.0 - 4.0 ng/mL 9.5 (H) 5.0 (H) 4.3 (H)     Impression and Plan:  76 year old with:  1.  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  He initially presented with localized disease in 2021 and advanced disease in 2022.  He continues to have a reasonable response to [facility] with a decline in his PSA down to 4.3.  Risks and benefits of continuing this treatment were reviewed.  Complications that include nausea, fatigue and edema were discussed.  Alternative options such as systemic chemotherapy were also reiterated.  He is agreeable to continue with this treatment.    2.  IV access: Port-A-Cath continue to be in place and flushed at this time.   3.  Bone pain: Pain is manageable at this time although he does have intermittent bone pain.   4.  Androgen deprivation therapy: He will receive Eligard today and repeated in 4 months.  Complication clinic weight gain hot flashes among others were reviewed.   5.  Goals of care: His disease is incurable although aggressive measures are warranted given his excellent performance status.   6.  Bone directed therapy: I recommended proceeding with Xgeva at this time.  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  He is agreeable to proceed.   7.   Follow-up: In 2 months for a follow-up.   30  minutes were dedicated to this visit.  The time spent on reviewing laboratory data, disease status update, treatment choices and future plan of care discussion.  Zola Button, MD 8/1/20233:06 PM

## 2021-09-12 ENCOUNTER — Other Ambulatory Visit: Payer: Self-pay | Admitting: Oncology

## 2021-09-12 ENCOUNTER — Other Ambulatory Visit (HOSPITAL_COMMUNITY): Payer: Self-pay

## 2021-09-12 ENCOUNTER — Telehealth: Payer: Self-pay | Admitting: *Deleted

## 2021-09-12 ENCOUNTER — Encounter: Payer: Self-pay | Admitting: Oncology

## 2021-09-12 LAB — PROSTATE-SPECIFIC AG, SERUM (LABCORP): Prostate Specific Ag, Serum: 6.8 ng/mL — ABNORMAL HIGH (ref 0.0–4.0)

## 2021-09-12 MED ORDER — TRAMADOL HCL 50 MG PO TABS: 50.0000 mg | ORAL_TABLET | Freq: Four times a day (QID) | ORAL | 1 refills | Status: DC | PRN

## 2021-09-12 NOTE — Telephone Encounter (Signed)
-----   Message from [name], MD sent at 09/12/2021  9:48 AM EDT ----- Please let him know his PSA is up. No changes at this time. Will repeat in in 6 weeks or so

## 2021-09-12 NOTE — Progress Notes (Signed)
Received PAF approval for patient received from pharmacy.  Submitted copy of approval letter and copay card to [facility] to submit retro claims for eligible dates of service to PAF.  Called patient to advise this was being done and to hold off on making payments until we receive determination from PAF for these dates of service. He verbalized understanding and was very Patent attorney.  Patient was approved for $5500 05/30/21 - 05/30/22 with a lookback date of 12/01/20.

## 2021-09-12 NOTE — Telephone Encounter (Signed)
PC to patient, informed him of Dr Hazeline Junker message below, he verbalizes understanding.  Patient is requesting pain medication rx for back & hip pain, MD informed.

## 2021-09-14 ENCOUNTER — Telehealth: Payer: Self-pay | Admitting: *Deleted

## 2021-09-14 ENCOUNTER — Other Ambulatory Visit: Payer: Self-pay | Admitting: Oncology

## 2021-09-14 DIAGNOSIS — C61 Malignant neoplasm of prostate: Secondary | ICD-10-CM

## 2021-09-14 NOTE — Telephone Encounter (Signed)
Notified that PET has been ordered. Family was concerned about increased PSA.

## 2021-09-17 ENCOUNTER — Telehealth: Payer: Self-pay | Admitting: Oncology

## 2021-09-17 DIAGNOSIS — C61 Malignant neoplasm of prostate: Secondary | ICD-10-CM

## 2021-09-17 NOTE — Telephone Encounter (Signed)
Called patient regarding upcoming appointments, patient is notified. 

## 2021-09-20 ENCOUNTER — Ambulatory Visit: Payer: PPO

## 2021-09-20 ENCOUNTER — Other Ambulatory Visit: Payer: PPO

## 2021-09-20 ENCOUNTER — Ambulatory Visit: Payer: PPO | Admitting: Oncology

## 2021-09-20 ENCOUNTER — Telehealth: Payer: Self-pay | Admitting: Genetic Counselor

## 2021-09-20 NOTE — Telephone Encounter (Signed)
Scheduled appt per 8/7 referral. Pt is aware of appt date and time. Pt is aware to arrive 15 mins prior to appt time and to bring and updated insurance card. Pt is aware of appt location.   

## 2021-09-25 ENCOUNTER — Other Ambulatory Visit (HOSPITAL_COMMUNITY): Payer: PPO

## 2021-09-26 ENCOUNTER — Encounter (HOSPITAL_COMMUNITY)
Admission: RE | Admit: 2021-09-26 | Discharge: 2021-09-26 | Disposition: A | Discharge status: Home / Self Care | Payer: PPO | Source: Ambulatory Visit | Attending: Oncology | Admitting: Oncology

## 2021-09-26 DIAGNOSIS — C61 Malignant neoplasm of prostate: Secondary | ICD-10-CM | POA: Insufficient documentation

## 2021-09-26 DIAGNOSIS — N210 Calculus in bladder: Secondary | ICD-10-CM | POA: Diagnosis not present

## 2021-09-26 DIAGNOSIS — C7951 Secondary malignant neoplasm of bone: Secondary | ICD-10-CM | POA: Diagnosis not present

## 2021-09-26 DIAGNOSIS — N1330 Unspecified hydronephrosis: Secondary | ICD-10-CM | POA: Diagnosis not present

## 2021-09-26 MED ORDER — PIFLIFOLASTAT F 18 (PYLARIFY) INJECTION
9.0000 | Freq: Once | INTRAVENOUS | Status: AC
  Administered 2021-09-26: 9 via INTRAVENOUS

## 2021-10-01 ENCOUNTER — Encounter: Payer: Self-pay | Admitting: Oncology

## 2021-10-01 NOTE — Progress Notes (Signed)
  Radiation Oncology         ([phone]) [phone] ________________________________  Name: Stephen Collins MRN: [id number]  Date: 06/20/2021  DOB: 07/19/1945  End of Treatment Note  Diagnosis:   76 y.o. 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     Plan: The patient has completed radiation treatment. The patient will return to radiation oncology clinic for routine followup in one month. I advised him to call or return sooner if he has any questions or concerns related to his recovery or treatment. ________________________________  Sheral Apley. Tammi Klippel, M.D.

## 2021-10-03 ENCOUNTER — Other Ambulatory Visit: Payer: Self-pay | Admitting: Oncology

## 2021-10-03 ENCOUNTER — Other Ambulatory Visit (HOSPITAL_COMMUNITY): Payer: Self-pay

## 2021-10-03 DIAGNOSIS — C61 Malignant neoplasm of prostate: Secondary | ICD-10-CM

## 2021-10-03 MED ORDER — ABIRATERONE ACETATE 250 MG PO TABS
1000.0000 mg | ORAL_TABLET | Freq: Every day | ORAL | 0 refills | Status: DC
  Filled 2021-10-03: qty 120, 30d supply, fill #0

## 2021-10-05 DIAGNOSIS — G4733 Obstructive sleep apnea (adult) (pediatric): Secondary | ICD-10-CM | POA: Diagnosis not present

## 2021-10-08 ENCOUNTER — Other Ambulatory Visit (HOSPITAL_COMMUNITY): Payer: Self-pay

## 2021-10-18 ENCOUNTER — Other Ambulatory Visit: Payer: Self-pay | Admitting: Oncology

## 2021-10-18 ENCOUNTER — Other Ambulatory Visit: Payer: Self-pay | Admitting: *Deleted

## 2021-10-18 ENCOUNTER — Telehealth: Payer: Self-pay | Admitting: *Deleted

## 2021-10-18 DIAGNOSIS — C61 Malignant neoplasm of prostate: Secondary | ICD-10-CM

## 2021-10-18 MED ORDER — OXYCODONE HCL 5 MG PO TABS: 5.0000 mg | ORAL_TABLET | ORAL | 0 refills | Status: DC | PRN

## 2021-10-18 NOTE — Telephone Encounter (Signed)
Notified of message from Dr Alen Blew.

## 2021-10-18 NOTE — Telephone Encounter (Signed)
Daughter Lanelle Bal called. States Mr Stephen Collins is currently visiting family in [place], will return in ~ a week. 1.Tramadol is not helping pain. 2. Tylenol not helping, OK to take Ibuprofen? 3. Any other suggestions for pain once he gets back to [place]? 4. Have you spoken to RT about referral? 5. If not RT, are there other treatment options?

## 2021-10-29 DIAGNOSIS — G4733 Obstructive sleep apnea (adult) (pediatric): Secondary | ICD-10-CM | POA: Diagnosis not present

## 2021-10-29 DIAGNOSIS — C61 Malignant neoplasm of prostate: Secondary | ICD-10-CM | POA: Diagnosis not present

## 2021-10-29 DIAGNOSIS — K635 Polyp of colon: Secondary | ICD-10-CM | POA: Diagnosis not present

## 2021-10-29 DIAGNOSIS — I10 Essential (primary) hypertension: Secondary | ICD-10-CM | POA: Diagnosis not present

## 2021-10-29 DIAGNOSIS — E785 Hyperlipidemia, unspecified: Secondary | ICD-10-CM | POA: Diagnosis not present

## 2021-10-29 DIAGNOSIS — F39 Unspecified mood [affective] disorder: Secondary | ICD-10-CM | POA: Diagnosis not present

## 2021-10-29 DIAGNOSIS — F17200 Nicotine dependence, unspecified, uncomplicated: Secondary | ICD-10-CM | POA: Diagnosis not present

## 2021-10-29 DIAGNOSIS — K219 Gastro-esophageal reflux disease without esophagitis: Secondary | ICD-10-CM | POA: Diagnosis not present

## 2021-10-29 DIAGNOSIS — E1169 Type 2 diabetes mellitus with other specified complication: Secondary | ICD-10-CM | POA: Diagnosis not present

## 2021-10-29 DIAGNOSIS — E669 Obesity, unspecified: Secondary | ICD-10-CM | POA: Diagnosis not present

## 2021-10-31 ENCOUNTER — Other Ambulatory Visit (HOSPITAL_COMMUNITY): Payer: Self-pay

## 2021-10-31 ENCOUNTER — Other Ambulatory Visit: Payer: Self-pay | Admitting: Oncology

## 2021-10-31 DIAGNOSIS — C61 Malignant neoplasm of prostate: Secondary | ICD-10-CM

## 2021-10-31 MED ORDER — ABIRATERONE ACETATE 250 MG PO TABS
1000.0000 mg | ORAL_TABLET | Freq: Every day | ORAL | 0 refills | Status: AC
  Filled 2021-10-31: qty 120, 30d supply, fill #0

## 2021-11-01 IMAGING — CT NM PET TUM IMG SKULL BASE T - THIGH
1 of 6 series · 2 of 25 positions shown · non-contrast
Comparison: prostate MRI 10/02/2019

CLINICAL DATA: Prostate cancer with elevated PSA equal 83.

EXAM:
NUCLEAR MEDICINE PET SKULL BASE TO THIGH
TECHNIQUE: 9.3 mCi F18 Piflufolastat (Pylarify) was injected intravenously.
Full-ring PET imaging was performed from the skull base to thigh
after the radiotracer. CT data was obtained and used for attenuation
correction and anatomic localization.

[Series 4: ct sk_thigh 5.0 bf37 · axial · 5.0mm · 0.98mm/px · z∈[-1295,-1051]mm · 2 of 243 slices shown]
[im 61/243  brain]
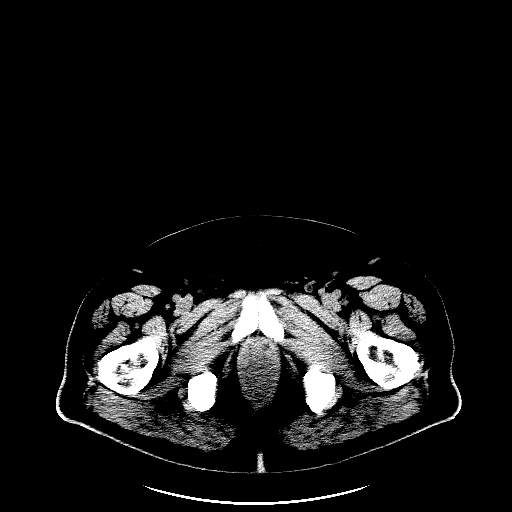
[im 122/243  brain]
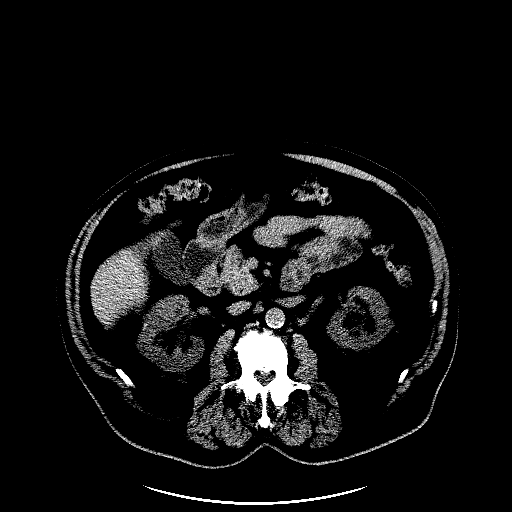

[2 of 25 positions shown; findings below may reference images not displayed]

FINDINGS: NECK

No radiotracer activity in neck lymph nodes.

Incidental CT finding: None

CHEST

Several small intensely radiotracer avid mediastinal and hilar
nodes. For example RIGHT lower paratracheal node measuring 8 mm
(image 69/series 4) with SUV max equal 8.6. Subcentimeter subcarinal
node with SUV max equal 9.2. Similar intense radiotracer activity in
small hilar lymph nodes.

No suspicious pulmonary nodules.

Incidental CT finding: None

ABDOMEN/PELVIS

Prostate: Intense radiotracer activity within the anterior and LEFT
anterior aspect of the prostate gland involving the mid gland and
apex. Broad lesion of intense radiotracer activity with SUV max
equal 13.1.

Lymph nodes: Intensely radiotracer avid but very small pelvic lymph
nodes. LEFT internal iliac lymph node measuring 6 mm (162/4) with
SUV max equal 18.2. Small LEFT common iliac node measuring 8 mm
(143/4) with SUV max equal 12.6.

Several similar periaortic retroperitoneal nodes with intense
radiotracer activity. For example node LEFT aorta at the level the
kidneys measures 7 mm with SUV max equal 13.7. Larger node ventral
to the aorta measures 14 mm with SUV max equal 18.2 on image 140.

Liver: No liver metastasis.

Incidental CT finding: None

SKELETON

There are widespread focal lesions with intense metabolic activity
within the axillary and appendicular skeleton. Lesion too numerous
to count and number over 100. Lesions are intensely radiotracer avid
and accompanied by subtle sclerotic lesions. Example lesion in the
posterior RIGHT acetabulum with SUV max equal 22. Lesion in the
proximal LEFT femur SUV max equal 23.5.

Lesion in the T9 vertebral body with SUV max equal 20.4. Lesions
involve the shoulder girdles and calvarium. Example lesion in the
clivus with SUV max equal
IMPRESSION: 1. Intense radiotracer activity with involving a broad region of the
anterior and anterior LEFT prostate gland. Findings consistent
primary prostate carcinoma.
2. Multiple small intensely radiotracer avid lymph nodes in the
pelvis, periaortic retroperitoneum and MEDIASTINUM consistent with
metastatic prostate cancer adenopathy.
3. Widespread focal radiotracer avid prostate cancer skeletal
metastasis involving the axillary and appendicular skeleton. Lesions
number greater than 100.

## 2021-11-02 ENCOUNTER — Other Ambulatory Visit: Payer: Self-pay

## 2021-11-02 ENCOUNTER — Ambulatory Visit
Admission: RE | Admit: 2021-11-02 | Discharge: 2021-11-02 | Disposition: A | Discharge status: Home / Self Care | Payer: PPO | Source: Ambulatory Visit | Attending: Radiation Oncology | Admitting: Radiation Oncology

## 2021-11-02 ENCOUNTER — Encounter: Payer: Self-pay | Admitting: Urology

## 2021-11-02 ENCOUNTER — Ambulatory Visit
Admission: RE | Admit: 2021-11-02 | Discharge: 2021-11-02 | Disposition: A | Discharge status: Home / Self Care | Payer: PPO | Source: Ambulatory Visit | Attending: Urology | Admitting: Urology

## 2021-11-02 VITALS — BP 156/85 | HR 79 | Temp 97.2°F | Resp 18 | Ht 68.0 in | Wt 217.0 lb

## 2021-11-02 DIAGNOSIS — Z191 Hormone sensitive malignancy status: Secondary | ICD-10-CM | POA: Diagnosis not present

## 2021-11-02 DIAGNOSIS — C61 Malignant neoplasm of prostate: Secondary | ICD-10-CM | POA: Diagnosis not present

## 2021-11-02 DIAGNOSIS — C7951 Secondary malignant neoplasm of bone: Secondary | ICD-10-CM | POA: Insufficient documentation

## 2021-11-02 NOTE — Addendum Note (Signed)
Encounter addended by: Mollie Germany, LPN on: 4/83/4758 3:07 AM  Actions taken: Visit diagnoses modified

## 2021-11-02 NOTE — Progress Notes (Signed)
L5 lumbar and sacral metastases reconsult. I verified patient's identity and began nursing interview. Patient reports dorsalgia 3/10, area of (L5). No other issues reported at this time.  Meaningful use complete.  BP (!) 156/85 (BP Location: Left Arm, Patient Position: Sitting, Cuff Size: Normal)   Pulse 79   Temp (!) 97.2 F (36.2 C) (Oral)   Resp 18   Ht '5\' 8"'$  (1.727 m)   Wt 217 lb (98.4 kg)   SpO2 100%   BMI 32.99 kg/m

## 2021-11-02 NOTE — Progress Notes (Signed)
Radiation Oncology         (336) [phone] ________________________________  Outpatient Re-Consultation  Name: Stephen Collins MRN: [id number]  Date: 11/02/2021  DOB: 05-10-1945  BL:TJQZ, [name], MD  [name], MD   REFERRING PHYSICIAN: [name], MD  DIAGNOSIS: 76 y.o. 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 Pain from bone metastases (HCC)  G89.3    C79.51     2. Malignant neoplasm of prostate metastatic to bone [facility])  C61    C79.51       HISTORY OF PRESENT ILLNESS: Stephen Collins is a 76 y.o. male with an initial diagnosis of prostate cancer in 2021.  At that time he was found to have a Gleason 3+3 adenocarcinoma of the prostate with a PSA of 14.8.  He was placed on active surveillance. His PSA in July 2022 was 30 and subsequently increased to 83.2 in September 2022. Based on these findings he underwent a PSMA PET scan was obtained on December 07, 2020 showing uptake in the left prostate gland consistent with prostate primary as well as multiple small radiotracer uptake in the lymph nodes including pelvic, para-aortic, retroperitoneal and mediastinal nodes and widespread uptake in the bony skeleton, with more than 100 lesions.     Prostate biopsy obtained on November 27, 2020 showed Gleason 4+5 adenocarcinoma of the prostate in the lateral region in all cores.   Taxotere chemotherapy was started on January 09, 2021 and ADT with  Eligard 22.5 mg every 3 months started on February 02, 2021. He completed 6 cycles of systemic therapy in March 2023 and continued on Eligard injections every 3 months.  Follow-up PSMA PET on 05/18/21 showed improvement in the bony metastatic disease.   However, he developed lower back and right-sided pelvic pain in 05/2021 that was hindering his mobility so he was referred to [place] for consideration of palliative  radiotherapy to these areas and was started on Zytiga.    He completed a 2 week course of daily external beam radiation to the painful lesion at L5 and bilateral SI joints in 06/2021 with significant improvement in his pain. He has continued on Eligard and Zytiga with a rise in his PSA recently from 4.3 in 07/2021 up to 6.8 on his most recent labs drawn on 09/11/21.  He has had progressive pain in the posterior right hip and in the right groin so he had a repeat PSMA PET scan on 09/26/21 that shows an overall improved pattern of skeletal metastases with many of the smaller lesions in the pelvis, spine and scapula nearly completely resolved, however, several of the larger lesions showed increased activity including in the right proximal femur and T5 and an unchanged appearance of the right sacrum/iliac bone lesion.      He has kindly been referred back to [place] today to discuss palliative radiation to the painful bony disease in the right pelvis and proximal femur.  PREVIOUS RADIATION THERAPY: Yes  06/07/21-06/20/21:  L4-SI Joints were treated to 30 Gy in 10 fractions  PAST MEDICAL HISTORY:  Past Medical History:  Diagnosis Date   Bilateral renal cysts    Bladder stones    Depression    Frequency of urination    History of kidney stones    Hyperlipidemia    Hypertension    OSA on CPAP    Wears glasses  PAST SURGICAL HISTORY: Past Surgical History:  Procedure Laterality Date   COLONOSCOPY  2006   CYSTOSCOPY WITH LITHOLAPAXY N/A 04/14/2015   Procedure: CYSTOSCOPY WITH LITHOLAPAXY;  Surgeon: Ardis Hughs, MD;  Location: [facility];  Service: Urology;  Laterality: N/A;   FLEXIBLE SIGMOIDOSCOPY N/A 09/22/2017   Procedure: FLEXIBLE SIGMOIDOSCOPY;  Surgeon: Laurence Spates, MD;  Location: [facility];  Service: Endoscopy;  Laterality: N/A;   IR IMAGING GUIDED PORT INSERTION  01/05/2021   POLYPECTOMY  09/22/2017   Procedure: POLYPECTOMY;  Surgeon: Laurence Spates, MD;   Location: [facility];  Service: Endoscopy;;   SUBMUCOSAL INJECTION  09/22/2017   Procedure: SUBMUCOSAL INJECTION;  Surgeon: Laurence Spates, MD;  Location: [facility];  Service: Endoscopy;;   TONSILLECTOMY  as child    FAMILY HISTORY: History reviewed. No pertinent family history.  SOCIAL HISTORY:  Social History   Socioeconomic History   Marital status: Single    Spouse name: Not on file   Number of children: Not on file   Years of education: Not on file   Highest education level: Not on file  Occupational History   Not on file  Tobacco Use   Smoking status: Every Day    Packs/day: 0.50    Years: 48.00    Total pack years: 24.00    Types: Cigarettes   Smokeless tobacco: Never  Substance and Sexual Activity   Alcohol use: Yes    Alcohol/week: 12.0 standard drinks of alcohol    Types: 12 Glasses of wine per week    Comment: 1-2 wine daily   Drug use: No   Sexual activity: Not on file  Other Topics Concern   Not on file  Social History Narrative   Not on file   Social Determinants of Health   Financial Resource Strain: Not on file  Food Insecurity: Not on file  Transportation Needs: Not on file  Physical Activity: Not on file  Stress: Not on file  Social Connections: Not on file  Intimate Partner Violence: Not on file    ALLERGIES: Patient has no known allergies.  MEDICATIONS:  Current Outpatient Medications  Medication Sig Dispense Refill   abiraterone acetate (ZYTIGA) 250 MG tablet Take 4 tablets (1,000 mg total) by mouth daily. Take on an empty stomach 1 hour before or 2 hours after a meal 120 tablet 0   buPROPion (WELLBUTRIN XL) 150 MG 24 hr tablet Take 150 mg by mouth daily.     CALCIUM PO Take 1,200 mg by mouth daily.     cetirizine (ZYRTEC) 10 MG tablet Take 10 mg by mouth daily as needed for allergies.     cholecalciferol (VITAMIN D3) 25 MCG (1000 UNIT) tablet Take 1,000 Units by mouth daily.     irbesartan-hydrochlorothiazide (AVALIDE) 300-12.5 MG  tablet Take 1 tablet by mouth every morning.      lidocaine-prilocaine (EMLA) cream Apply 1 application topically as needed. 30 g 0   Multiple Vitamins-Minerals (ADULT GUMMY PO) Take 2 capsules by mouth daily.     naproxen sodium (ALEVE) 220 MG tablet Take 220 mg by mouth daily as needed (pain).     oxyCODONE (OXY IR/ROXICODONE) 5 MG immediate release tablet Take 1 tablet (5 mg total) by mouth every 4 (four) hours as needed for severe pain. 30 tablet 0   predniSONE (DELTASONE) 5 MG tablet Take 1 tablet (5 mg total) by mouth daily with breakfast. 90 tablet 3   prochlorperazine (COMPAZINE) 10 MG tablet TAKE 1 TABLET(10 MG) BY  MOUTH EVERY 6 HOURS AS NEEDED FOR NAUSEA OR VOMITING 30 tablet 0   rosuvastatin (CRESTOR) 20 MG tablet Take 20 mg by mouth daily.     traMADol (ULTRAM) 50 MG tablet Take 1 tablet (50 mg total) by mouth every 6 (six) hours as needed. 30 tablet 1   No current facility-administered medications for this encounter.   Facility-Administered Medications Ordered in Other Encounters  Medication Dose Route Frequency Provider Last Rate Last Admin   0.9 %  sodium chloride infusion   Intravenous Once PRN [name], MD       albuterol (PROVENTIL) (2.5 MG/3ML) 0.083% nebulizer solution 2.5 mg  2.5 mg Nebulization Once PRN [name], MD        REVIEW OF SYSTEMS:  On review of systems, the patient reports that he is doing well in general but has been having some progressive pain in the right posterior pelvis/hip area that is beginning to interfere with his daily activities.  He was away visiting friends and family in [place] last week and was not able to be as active as he would have liked to have been. The pain does not radiate down the right lower extremity and he denies any paresthesias or changes in bowel or bladder function.  He denies any chest pain, shortness of breath, cough, fevers, chills, night sweats, or unintended weight changes. He denies any bowel disturbances, and  denies abdominal pain, nausea or vomiting. He denies any other new musculoskeletal or joint aches or pains. A complete review of systems is obtained and is otherwise negative.   PHYSICAL EXAM:  Wt Readings from Last 3 Encounters:  11/02/21 217 lb (98.4 kg)  09/11/21 213 lb 3.2 oz (96.7 kg)  08/07/21 210 lb 3.2 oz (95.3 kg)   Temp Readings from Last 3 Encounters:  11/02/21 (!) 97.2 F (36.2 C) (Oral)  09/11/21 98.2 F (36.8 C) (Oral)  08/07/21 98.1 F (36.7 C) (Temporal)   BP Readings from Last 3 Encounters:  11/02/21 (!) 156/85  09/11/21 (!) 154/84  08/07/21 (!) 152/76   Pulse Readings from Last 3 Encounters:  11/02/21 79  09/11/21 (!) 102  08/07/21 86   Pain Assessment Pain Score: 3  (L5)/10  In general this is a well appearing Caucasian male in no acute distress. He's alert and oriented x4 and appropriate throughout the examination. Cardiopulmonary assessment is negative for acute distress, and he exhibits normal effort.    KPS = 90  100 - Normal; no complaints; no evidence of disease. 90   - Able to carry on normal activity; minor signs or symptoms of disease. 80   - Normal activity with effort; some signs or symptoms of disease. 68   - Cares for self; unable to carry on normal activity or to do active work. 60   - Requires occasional assistance, but is able to care for most of his personal needs. 50   - Requires considerable assistance and frequent medical care. 50   - Disabled; requires special care and assistance. 74   - Severely disabled; hospital admission is indicated although death not imminent. 42   - Very sick; hospital admission necessary; active supportive treatment necessary. 10   - Moribund; fatal processes progressing rapidly. 0     - Dead  Karnofsky DA, Abelmann WH, Craver LS and [facility] [phone]) The use of the nitrogen mustards in the palliative treatment of carcinoma: with particular reference to bronchogenic carcinoma Cancer 1 634-56  LABORATORY  DATA:  Lab Results  Component Value Date   WBC 8.5 09/11/2021   HGB 12.4 (L) 09/11/2021   HCT 36.5 (L) 09/11/2021   MCV 94.1 09/11/2021   PLT 181 09/11/2021   Lab Results  Component Value Date   NA 139 09/11/2021   K 4.0 09/11/2021   CL 108 09/11/2021   CO2 25 09/11/2021   Lab Results  Component Value Date   ALT 24 09/11/2021   AST 19 09/11/2021   ALKPHOS 106 09/11/2021   BILITOT 0.5 09/11/2021     RADIOGRAPHY: No results found.    IMPRESSION/PLAN: 1. 76 y.o. 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  We discussed the natural history of painful bone metastases and general treatment, highlighting the role of radiotherapy in the management.  We discussed the available radiation techniques, and focused on the details of logistics and delivery.  The recommendation is for a 2-week course of daily palliative radiotherapy to the painful disease in the right pelvis and right proximal femur.  We reviewed the anticipated acute and late sequelae associated with radiation in this setting.  The patient was encouraged to ask questions that were answered to his stated satisfaction.   At the conclusion of our conversation, the patient is interested in moving forward with the recommended 2-week course of daily palliative radiotherapy to the painful disease in the right pelvis and right proximal femur.  He has freely signed written consent to proceed today in the office and a copy of this document will be placed in his medical record.  He is tentatively scheduled for CT simulation/treatment planning at 1 PM on Monday, 11/05/2021, in anticipation of beginning his daily treatments on Wednesday, 11/07/2021.  We will share our discussion with Dr. Alen Blew.  We enjoyed meeting with him again today and look forward to continuing to  participate in his care.  He knows that he is welcome to call at anytime in the interim with any questions or concerns related to radiation.  We personally spent 45 minutes in this encounter including chart review, reviewing radiological studies, meeting face-to-face with the patient, entering orders and completing documentation.   Nicholos Johns, PA-C    Tyler Pita, MD  [facility] Direct Dial: [phone]  Fax: [phone] [email or web address]  Skype  LinkedIn

## 2021-11-05 ENCOUNTER — Other Ambulatory Visit: Payer: Self-pay

## 2021-11-05 ENCOUNTER — Ambulatory Visit
Admission: RE | Admit: 2021-11-05 | Discharge: 2021-11-05 | Disposition: A | Discharge status: Home / Self Care | Payer: PPO | Source: Ambulatory Visit | Attending: Radiation Oncology | Admitting: Radiation Oncology

## 2021-11-05 DIAGNOSIS — C7951 Secondary malignant neoplasm of bone: Secondary | ICD-10-CM | POA: Insufficient documentation

## 2021-11-05 DIAGNOSIS — Z191 Hormone sensitive malignancy status: Secondary | ICD-10-CM | POA: Diagnosis not present

## 2021-11-05 DIAGNOSIS — C61 Malignant neoplasm of prostate: Secondary | ICD-10-CM | POA: Insufficient documentation

## 2021-11-05 DIAGNOSIS — Z510 Encounter for antineoplastic radiation therapy: Secondary | ICD-10-CM | POA: Diagnosis not present

## 2021-11-06 NOTE — Progress Notes (Signed)
  Radiation Oncology         (336) [phone] ________________________________  Name: Stephen Collins MRN: [id number]  Date: 11/05/2021  DOB: Nov 14, 1945  SIMULATION AND TREATMENT PLANNING NOTE    ICD-10-CM   1. Malignant neoplasm of prostate metastatic to bone ([place])  C61    C79.51       DIAGNOSIS:   76 y.o. 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  Identity was confirmed.  All relevant records and images related to the planned course of therapy were reviewed.  The patient freely provided informed written consent to proceed with treatment after reviewing the details related to the planned course of therapy. The consent form was witnessed and verified by the simulation staff.  Then, the patient was set-up in a stable reproducible  supine position for radiation therapy.  CT images were obtained.  Surface markings were placed.  The CT images were loaded into the planning software.  Then the target and avoidance structures were contoured.  Treatment planning then occurred.  The radiation prescription was entered and confirmed.  Then, I designed and supervised the construction of a total of 3 medically necessary complex treatment devices consisting of leg positioner and MLC apertures to cover the treated hip area.  I have requested : 3D Simulation  I have requested a DVH of the following structures: Rectum, Bladder, femoral heads and target.  PLAN:  The patient will receive 30 Gy in 10 fractions.  ________________________________  Sheral Apley Tammi Klippel, M.D.

## 2021-11-07 ENCOUNTER — Other Ambulatory Visit: Payer: Self-pay

## 2021-11-07 ENCOUNTER — Ambulatory Visit
Admission: RE | Admit: 2021-11-07 | Discharge: 2021-11-07 | Disposition: A | Discharge status: Home / Self Care | Payer: PPO | Source: Ambulatory Visit | Attending: Radiation Oncology | Admitting: Radiation Oncology

## 2021-11-07 DIAGNOSIS — C7951 Secondary malignant neoplasm of bone: Secondary | ICD-10-CM | POA: Diagnosis not present

## 2021-11-07 DIAGNOSIS — C61 Malignant neoplasm of prostate: Secondary | ICD-10-CM | POA: Diagnosis not present

## 2021-11-07 DIAGNOSIS — Z191 Hormone sensitive malignancy status: Secondary | ICD-10-CM | POA: Diagnosis not present

## 2021-11-07 DIAGNOSIS — Z510 Encounter for antineoplastic radiation therapy: Secondary | ICD-10-CM | POA: Diagnosis not present

## 2021-11-07 LAB — RAD ONC ARIA SESSION SUMMARY
Course Elapsed Days: 0
Plan Fractions Treated to Date: 1
Plan Prescribed Dose Per Fraction: 3 Gy
Plan Total Fractions Prescribed: 10
Plan Total Prescribed Dose: 30 Gy
Reference Point Dosage Given to Date: 3 Gy
Reference Point Session Dosage Given: 3 Gy
Session Number: 1

## 2021-11-08 ENCOUNTER — Ambulatory Visit
Admission: RE | Admit: 2021-11-08 | Discharge: 2021-11-08 | Disposition: A | Discharge status: Home / Self Care | Payer: PPO | Source: Ambulatory Visit | Attending: Radiation Oncology | Admitting: Radiation Oncology

## 2021-11-08 ENCOUNTER — Other Ambulatory Visit: Payer: Self-pay

## 2021-11-08 ENCOUNTER — Other Ambulatory Visit (HOSPITAL_COMMUNITY): Payer: Self-pay

## 2021-11-08 DIAGNOSIS — C7951 Secondary malignant neoplasm of bone: Secondary | ICD-10-CM | POA: Diagnosis not present

## 2021-11-08 DIAGNOSIS — C61 Malignant neoplasm of prostate: Secondary | ICD-10-CM | POA: Diagnosis not present

## 2021-11-08 DIAGNOSIS — Z510 Encounter for antineoplastic radiation therapy: Secondary | ICD-10-CM | POA: Diagnosis not present

## 2021-11-08 DIAGNOSIS — Z191 Hormone sensitive malignancy status: Secondary | ICD-10-CM | POA: Diagnosis not present

## 2021-11-08 LAB — RAD ONC ARIA SESSION SUMMARY
Course Elapsed Days: 1
Plan Fractions Treated to Date: 2
Plan Prescribed Dose Per Fraction: 3 Gy
Plan Total Fractions Prescribed: 10
Plan Total Prescribed Dose: 30 Gy
Reference Point Dosage Given to Date: 6 Gy
Reference Point Session Dosage Given: 3 Gy
Session Number: 2

## 2021-11-09 ENCOUNTER — Other Ambulatory Visit: Payer: Self-pay

## 2021-11-09 ENCOUNTER — Ambulatory Visit
Admission: RE | Admit: 2021-11-09 | Discharge: 2021-11-09 | Disposition: A | Discharge status: Home / Self Care | Payer: PPO | Source: Ambulatory Visit | Attending: Radiation Oncology | Admitting: Radiation Oncology

## 2021-11-09 ENCOUNTER — Other Ambulatory Visit (HOSPITAL_COMMUNITY): Payer: Self-pay

## 2021-11-09 DIAGNOSIS — C61 Malignant neoplasm of prostate: Secondary | ICD-10-CM | POA: Diagnosis not present

## 2021-11-09 DIAGNOSIS — Z510 Encounter for antineoplastic radiation therapy: Secondary | ICD-10-CM | POA: Diagnosis not present

## 2021-11-09 DIAGNOSIS — C7951 Secondary malignant neoplasm of bone: Secondary | ICD-10-CM | POA: Diagnosis not present

## 2021-11-09 DIAGNOSIS — Z191 Hormone sensitive malignancy status: Secondary | ICD-10-CM | POA: Diagnosis not present

## 2021-11-09 LAB — RAD ONC ARIA SESSION SUMMARY
Course Elapsed Days: 2
Plan Fractions Treated to Date: 3
Plan Prescribed Dose Per Fraction: 3 Gy
Plan Total Fractions Prescribed: 10
Plan Total Prescribed Dose: 30 Gy
Reference Point Dosage Given to Date: 9 Gy
Reference Point Session Dosage Given: 3 Gy
Session Number: 3

## 2021-11-12 ENCOUNTER — Other Ambulatory Visit: Payer: Self-pay

## 2021-11-12 ENCOUNTER — Ambulatory Visit
Admission: RE | Admit: 2021-11-12 | Discharge: 2021-11-12 | Disposition: A | Discharge status: Home / Self Care | Payer: PPO | Source: Ambulatory Visit | Attending: Radiation Oncology | Admitting: Radiation Oncology

## 2021-11-12 DIAGNOSIS — Z510 Encounter for antineoplastic radiation therapy: Secondary | ICD-10-CM | POA: Diagnosis not present

## 2021-11-12 DIAGNOSIS — C7951 Secondary malignant neoplasm of bone: Secondary | ICD-10-CM | POA: Insufficient documentation

## 2021-11-12 DIAGNOSIS — C61 Malignant neoplasm of prostate: Secondary | ICD-10-CM | POA: Diagnosis not present

## 2021-11-12 DIAGNOSIS — Z191 Hormone sensitive malignancy status: Secondary | ICD-10-CM | POA: Diagnosis not present

## 2021-11-12 LAB — RAD ONC ARIA SESSION SUMMARY
Course Elapsed Days: 5
Plan Fractions Treated to Date: 4
Plan Prescribed Dose Per Fraction: 3 Gy
Plan Total Fractions Prescribed: 10
Plan Total Prescribed Dose: 30 Gy
Reference Point Dosage Given to Date: 12 Gy
Reference Point Session Dosage Given: 3 Gy
Session Number: 4

## 2021-11-13 ENCOUNTER — Ambulatory Visit
Admission: RE | Admit: 2021-11-13 | Discharge: 2021-11-13 | Disposition: A | Discharge status: Home / Self Care | Payer: PPO | Source: Ambulatory Visit | Attending: Radiation Oncology | Admitting: Radiation Oncology

## 2021-11-13 ENCOUNTER — Other Ambulatory Visit: Payer: Self-pay

## 2021-11-13 DIAGNOSIS — C7951 Secondary malignant neoplasm of bone: Secondary | ICD-10-CM | POA: Diagnosis not present

## 2021-11-13 DIAGNOSIS — Z191 Hormone sensitive malignancy status: Secondary | ICD-10-CM | POA: Diagnosis not present

## 2021-11-13 DIAGNOSIS — C61 Malignant neoplasm of prostate: Secondary | ICD-10-CM | POA: Diagnosis not present

## 2021-11-13 DIAGNOSIS — Z510 Encounter for antineoplastic radiation therapy: Secondary | ICD-10-CM | POA: Diagnosis not present

## 2021-11-13 LAB — RAD ONC ARIA SESSION SUMMARY
Course Elapsed Days: 6
Plan Fractions Treated to Date: 5
Plan Prescribed Dose Per Fraction: 3 Gy
Plan Total Fractions Prescribed: 10
Plan Total Prescribed Dose: 30 Gy
Reference Point Dosage Given to Date: 15 Gy
Reference Point Session Dosage Given: 3 Gy
Session Number: 5

## 2021-11-14 ENCOUNTER — Ambulatory Visit
Admission: RE | Admit: 2021-11-14 | Discharge: 2021-11-14 | Disposition: A | Discharge status: Home / Self Care | Payer: PPO | Source: Ambulatory Visit | Attending: Radiation Oncology | Admitting: Radiation Oncology

## 2021-11-14 ENCOUNTER — Inpatient Hospital Stay: Payer: PPO

## 2021-11-14 ENCOUNTER — Inpatient Hospital Stay (HOSPITAL_BASED_OUTPATIENT_CLINIC_OR_DEPARTMENT_OTHER): Payer: PPO | Admitting: Oncology

## 2021-11-14 ENCOUNTER — Other Ambulatory Visit: Payer: Self-pay

## 2021-11-14 ENCOUNTER — Inpatient Hospital Stay: Payer: PPO | Attending: Oncology

## 2021-11-14 VITALS — BP 154/80 | HR 75 | Temp 97.5°F | Resp 16 | Ht 68.0 in | Wt 218.5 lb

## 2021-11-14 DIAGNOSIS — C7951 Secondary malignant neoplasm of bone: Secondary | ICD-10-CM | POA: Diagnosis not present

## 2021-11-14 DIAGNOSIS — Z191 Hormone sensitive malignancy status: Secondary | ICD-10-CM | POA: Diagnosis not present

## 2021-11-14 DIAGNOSIS — Z7952 Long term (current) use of systemic steroids: Secondary | ICD-10-CM | POA: Insufficient documentation

## 2021-11-14 DIAGNOSIS — G893 Neoplasm related pain (acute) (chronic): Secondary | ICD-10-CM | POA: Insufficient documentation

## 2021-11-14 DIAGNOSIS — C61 Malignant neoplasm of prostate: Secondary | ICD-10-CM | POA: Diagnosis not present

## 2021-11-14 DIAGNOSIS — Z9221 Personal history of antineoplastic chemotherapy: Secondary | ICD-10-CM | POA: Insufficient documentation

## 2021-11-14 DIAGNOSIS — Z923 Personal history of irradiation: Secondary | ICD-10-CM | POA: Insufficient documentation

## 2021-11-14 DIAGNOSIS — Z95828 Presence of other vascular implants and grafts: Secondary | ICD-10-CM

## 2021-11-14 DIAGNOSIS — Z510 Encounter for antineoplastic radiation therapy: Secondary | ICD-10-CM | POA: Diagnosis not present

## 2021-11-14 DIAGNOSIS — I10 Essential (primary) hypertension: Secondary | ICD-10-CM | POA: Insufficient documentation

## 2021-11-14 DIAGNOSIS — Z79899 Other long term (current) drug therapy: Secondary | ICD-10-CM | POA: Insufficient documentation

## 2021-11-14 LAB — RAD ONC ARIA SESSION SUMMARY
Course Elapsed Days: 7
Plan Fractions Treated to Date: 6
Plan Prescribed Dose Per Fraction: 3 Gy
Plan Total Fractions Prescribed: 10
Plan Total Prescribed Dose: 30 Gy
Reference Point Dosage Given to Date: 18 Gy
Reference Point Session Dosage Given: 3 Gy
Session Number: 6

## 2021-11-14 LAB — CMP (CANCER CENTER ONLY)
ALT: 21 U/L (ref 0–44)
AST: 13 U/L — ABNORMAL LOW (ref 15–41)
Albumin: 4 g/dL (ref 3.5–5.0)
Alkaline Phosphatase: 55 U/L (ref 38–126)
Anion gap: 4 — ABNORMAL LOW (ref 5–15)
BUN: 17 mg/dL (ref 8–23)
CO2: 28 mmol/L (ref 22–32)
Calcium: 9.5 mg/dL (ref 8.9–10.3)
Chloride: 106 mmol/L (ref 98–111)
Creatinine: 0.76 mg/dL (ref 0.61–1.24)
GFR, Estimated: >60 mL/min (ref >60)
Glucose, Bld: 181 mg/dL — ABNORMAL HIGH (ref 70–99)
Potassium: 4.4 mmol/L (ref 3.5–5.1)
Sodium: 138 mmol/L (ref 135–145)
Total Bilirubin: 0.5 mg/dL (ref 0.3–1.2)
Total Protein: 6.2 g/dL — ABNORMAL LOW (ref 6.5–8.1)

## 2021-11-14 LAB — CBC WITH DIFFERENTIAL (CANCER CENTER ONLY)
Abs Immature Granulocytes: 0.03 10*3/uL (ref 0.00–0.07)
Basophils Absolute: 0 10*3/uL (ref 0.0–0.1)
Basophils Relative: 0 %
Eosinophils Absolute: 0.1 10*3/uL (ref 0.0–0.5)
Eosinophils Relative: 2 %
HCT: 34.5 % — ABNORMAL LOW (ref 39.0–52.0)
Hemoglobin: 11.8 g/dL — ABNORMAL LOW (ref 13.0–17.0)
Immature Granulocytes: 1 %
Lymphocytes Relative: 15 %
Lymphs Abs: 0.8 10*3/uL (ref 0.7–4.0)
MCH: 32.4 pg (ref 26.0–34.0)
MCHC: 34.2 g/dL (ref 30.0–36.0)
MCV: 94.8 fL (ref 80.0–100.0)
Monocytes Absolute: 0.5 10*3/uL (ref 0.1–1.0)
Monocytes Relative: 10 %
Neutro Abs: 3.9 10*3/uL (ref 1.7–7.7)
Neutrophils Relative %: 72 %
Platelet Count: 158 10*3/uL (ref 150–400)
RBC: 3.64 MIL/uL — ABNORMAL LOW (ref 4.22–5.81)
RDW: 13.5 % (ref 11.5–15.5)
WBC Count: 5.4 10*3/uL (ref 4.0–10.5)
nRBC: 0 % (ref 0.0–0.2)

## 2021-11-14 MED ORDER — DENOSUMAB 120 MG/1.7ML ~~LOC~~ SOLN
120.0000 mg | Freq: Once | SUBCUTANEOUS | Status: AC
  Administered 2021-11-14: 120 mg via SUBCUTANEOUS
  Filled 2021-11-14: qty 1.7

## 2021-11-14 NOTE — Progress Notes (Signed)
Hematology and Oncology Follow Up Visit  EDENILSON AUSTAD [id number] [birth year] 76 y.o. 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  He was found to have Gleason score 9 and a PSA 57.      Prior Therapy: He was on active surveillance and did not receive any additional treatment.  His PSA increased to 83.2 in September 2022 and a PET scan showed diffuse involvement.  Repeat prostate biopsy on November 27, 2020 showed a Gleason score 4+5 = 9.  Eligard 22.5 mg every 3 months started on February 02, 2021.  Taxotere chemotherapy 75 mg per metered squared started on January 09, 2021.  The dose was changed to 60 mg per metered square starting with cycle 4 of therapy.  He completed 6 cycles of therapy in March 2023.  He is status post radiation therapy to the L5 and sacral region.  Completed 30 Gray in 10 fractions in May 2023.  Current therapy:  Eligard 30 mg every 4 months with the last injection given on May 18, 2021.  This will be repeated in December 2023.  Zytiga 1000 mg daily with prednisone 5 mg daily started in April 2023.  Xgeva 120 mg every 2 months.  He is currently receiving palliative radiation therapy to the right pelvis and femur.  Interim History: Mr. Mexicano presents today for repeat evaluation.  Since last visit, he started palliative radiation therapy under the care of Dr. Tammi Klippel.  Clinically, he reports feeling fair without any major changes in his health.  He is currently reporting intermittent pain and takes oxycodone infrequently.  He was able to travel to [place] although his mobility is limited for extended period of time.  He is able to drive and attends to activities of daily living and lives independently.  He has reported some occasional dizziness but no syncope.    Medications: Reviewed without  changes. Current Outpatient Medications  Medication Sig Dispense Refill   abiraterone acetate (ZYTIGA) 250 MG tablet Take 4 tablets (1,000 mg total) by mouth daily. Take on an empty stomach 1 hour before or 2 hours after a meal 120 tablet 0   buPROPion (WELLBUTRIN XL) 150 MG 24 hr tablet Take 150 mg by mouth daily.     CALCIUM PO Take 1,200 mg by mouth daily.     cetirizine (ZYRTEC) 10 MG tablet Take 10 mg by mouth daily as needed for allergies.     cholecalciferol (VITAMIN D3) 25 MCG (1000 UNIT) tablet Take 1,000 Units by mouth daily.     irbesartan-hydrochlorothiazide (AVALIDE) 300-12.5 MG tablet Take 1 tablet by mouth every morning.      lidocaine-prilocaine (EMLA) cream Apply 1 application topically as needed. 30 g 0   Multiple Vitamins-Minerals (ADULT GUMMY PO) Take 2 capsules by mouth daily.     naproxen sodium (ALEVE) 220 MG tablet Take 220 mg by mouth daily as needed (pain).     oxyCODONE (OXY IR/ROXICODONE) 5 MG immediate release tablet Take 1 tablet (5 mg total) by mouth every 4 (four) hours as needed for severe pain. 30 tablet 0   predniSONE (DELTASONE) 5 MG tablet Take 1 tablet (5 mg total) by mouth daily with breakfast. 90 tablet 3   prochlorperazine (COMPAZINE) 10 MG tablet TAKE 1 TABLET(10 MG) BY MOUTH EVERY 6 HOURS AS NEEDED FOR NAUSEA OR VOMITING 30 tablet 0   rosuvastatin (CRESTOR) 20 MG  tablet Take 20 mg by mouth daily.     traMADol (ULTRAM) 50 MG tablet Take 1 tablet (50 mg total) by mouth every 6 (six) hours as needed. 30 tablet 1   No current facility-administered medications for this visit.   Facility-Administered Medications Ordered in Other Visits  Medication Dose Route Frequency Provider Last Rate Last Admin   0.9 %  sodium chloride infusion   Intravenous Once PRN [name], MD       albuterol (PROVENTIL) (2.5 MG/3ML) 0.083% nebulizer solution 2.5 mg  2.5 mg Nebulization Once PRN [name], MD           Physical Exam:    Blood pressure (!)  154/80, pulse 75, temperature (!) 97.5 F (36.4 C), temperature source Temporal, resp. rate 16, height '5\' 8"'$  (1.727 m), weight 218 lb 8 oz (99.1 kg), SpO2 99 %.      ECOG: 1    General appearance: Alert, awake without any distress. Head: Atraumatic without abnormalities Oropharynx: Without any thrush or ulcers. Eyes: No scleral icterus. Lymph nodes: No lymphadenopathy noted in the cervical, supraclavicular, or axillary nodes Heart:regular rate and rhythm, without any murmurs or gallops.   Lung: Clear to auscultation without any rhonchi, wheezes or dullness to percussion. Abdomin: Soft, nontender without any shifting dullness or ascites. Musculoskeletal: No clubbing or cyanosis. Neurological: No motor or sensory deficits. Skin: No rashes or lesions.          Lab Results: Lab Results  Component Value Date   WBC 8.5 09/11/2021   HGB 12.4 (L) 09/11/2021   HCT 36.5 (L) 09/11/2021   MCV 94.1 09/11/2021   PLT 181 09/11/2021     Chemistry      Component Value Date/Time   NA 139 09/11/2021 1510   K 4.0 09/11/2021 1510   CL 108 09/11/2021 1510   CO2 25 09/11/2021 1510   BUN 23 09/11/2021 1510   CREATININE 0.83 09/11/2021 1510      Component Value Date/Time   CALCIUM 9.6 09/11/2021 1510   ALKPHOS 106 09/11/2021 1510   AST 19 09/11/2021 1510   ALT 24 09/11/2021 1510   BILITOT 0.5 09/11/2021 1510      Latest Reference Range & Units 06/29/21 14:24 08/07/21 13:09 09/11/21 15:10  Prostate Specific Ag, Serum 0.0 - 4.0 ng/mL 5.0 (H) 4.3 (H) 6.8 (H)  (H): Data is abnormally high    Impression and Plan:  76 year old with:  1.  Advanced prostate cancer with disease to the bone and lymphadenopathy documented in 2022.  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  Treatment choices moving forward were discussed and the natural course of his current disease was reviewed.  He is currently on Zytiga with less than robust response in the last PSA went up slightly.   Alternative treatment options beyond Zytiga would be Jevtana chemotherapy as well as Pluvicto.  We will obtain Guardant360 analysis as well to evaluate for possible PARP inhibitor.  For the time being I recommended continuing Zytiga and to complete the radiation therapy and we will recheck his PSA before the next visit.  Referral to Pluvicto treatment could be considered if his PSA continues to rise after radiation.    2.  IV access: Port-A-Cath remains in place and will continue to be flushed periodically.   3.  Bone pain: Related to metastatic prostate cancer.  He is currently receiving palliative radiation therapy.   4.  Androgen deprivation therapy: This will be repeated in December 2023.  5.  Goals of care: Therapy remains palliative although aggressive measures are warranted given his reasonable performance status.   6.  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   7.  Follow-up: He will return in 2 months for a follow-up.   30  minutes were spent on this encounter.  The time was dedicated to updating disease status, treatment choices and outlining future plan of care review.  Zola Button, MD 10/4/20238:12 AM

## 2021-11-15 ENCOUNTER — Other Ambulatory Visit: Payer: Self-pay

## 2021-11-15 ENCOUNTER — Ambulatory Visit
Admission: RE | Admit: 2021-11-15 | Discharge: 2021-11-15 | Disposition: A | Discharge status: Home / Self Care | Payer: PPO | Source: Ambulatory Visit | Attending: Radiation Oncology | Admitting: Radiation Oncology

## 2021-11-15 DIAGNOSIS — Z510 Encounter for antineoplastic radiation therapy: Secondary | ICD-10-CM | POA: Diagnosis not present

## 2021-11-15 DIAGNOSIS — C7951 Secondary malignant neoplasm of bone: Secondary | ICD-10-CM | POA: Diagnosis not present

## 2021-11-15 DIAGNOSIS — Z191 Hormone sensitive malignancy status: Secondary | ICD-10-CM | POA: Diagnosis not present

## 2021-11-15 DIAGNOSIS — C61 Malignant neoplasm of prostate: Secondary | ICD-10-CM | POA: Diagnosis not present

## 2021-11-15 LAB — RAD ONC ARIA SESSION SUMMARY
Course Elapsed Days: 8
Plan Fractions Treated to Date: 7
Plan Prescribed Dose Per Fraction: 3 Gy
Plan Total Fractions Prescribed: 10
Plan Total Prescribed Dose: 30 Gy
Reference Point Dosage Given to Date: 21 Gy
Reference Point Session Dosage Given: 3 Gy
Session Number: 7

## 2021-11-16 ENCOUNTER — Ambulatory Visit
Admission: RE | Admit: 2021-11-16 | Discharge: 2021-11-16 | Disposition: A | Discharge status: Home / Self Care | Payer: PPO | Source: Ambulatory Visit | Attending: Radiation Oncology | Admitting: Radiation Oncology

## 2021-11-16 ENCOUNTER — Other Ambulatory Visit: Payer: Self-pay

## 2021-11-16 ENCOUNTER — Telehealth: Payer: Self-pay

## 2021-11-16 DIAGNOSIS — C61 Malignant neoplasm of prostate: Secondary | ICD-10-CM | POA: Diagnosis not present

## 2021-11-16 DIAGNOSIS — C7951 Secondary malignant neoplasm of bone: Secondary | ICD-10-CM | POA: Diagnosis not present

## 2021-11-16 DIAGNOSIS — Z510 Encounter for antineoplastic radiation therapy: Secondary | ICD-10-CM | POA: Diagnosis not present

## 2021-11-16 DIAGNOSIS — Z191 Hormone sensitive malignancy status: Secondary | ICD-10-CM | POA: Diagnosis not present

## 2021-11-16 LAB — RAD ONC ARIA SESSION SUMMARY
Course Elapsed Days: 9
Plan Fractions Treated to Date: 8
Plan Prescribed Dose Per Fraction: 3 Gy
Plan Total Fractions Prescribed: 10
Plan Total Prescribed Dose: 30 Gy
Reference Point Dosage Given to Date: 24 Gy
Reference Point Session Dosage Given: 3 Gy
Session Number: 8

## 2021-11-16 LAB — PROSTATE-SPECIFIC AG, SERUM (LABCORP): Prostate Specific Ag, Serum: 19.6 ng/mL — ABNORMAL HIGH (ref 0.0–4.0)

## 2021-11-16 NOTE — Telephone Encounter (Signed)
-----   Message from [name], MD sent at 11/16/2021  8:53 AM EDT ----- Please let him know his PSA is up. The plan is to continue radiation as planned

## 2021-11-16 NOTE — Telephone Encounter (Signed)
Pt advised of lab results and the plan to continue radiation.  Pt is asking if he needs to be seen sooner than the December appt. Please advise

## 2021-11-19 ENCOUNTER — Encounter: Payer: Self-pay | Admitting: Oncology

## 2021-11-19 ENCOUNTER — Ambulatory Visit
Admission: RE | Admit: 2021-11-19 | Discharge: 2021-11-19 | Disposition: A | Discharge status: Home / Self Care | Payer: PPO | Source: Ambulatory Visit | Attending: Radiation Oncology | Admitting: Radiation Oncology

## 2021-11-19 ENCOUNTER — Other Ambulatory Visit: Payer: Self-pay

## 2021-11-19 DIAGNOSIS — C61 Malignant neoplasm of prostate: Secondary | ICD-10-CM | POA: Diagnosis not present

## 2021-11-19 DIAGNOSIS — C7951 Secondary malignant neoplasm of bone: Secondary | ICD-10-CM | POA: Diagnosis not present

## 2021-11-19 DIAGNOSIS — Z510 Encounter for antineoplastic radiation therapy: Secondary | ICD-10-CM | POA: Diagnosis not present

## 2021-11-19 DIAGNOSIS — Z191 Hormone sensitive malignancy status: Secondary | ICD-10-CM | POA: Diagnosis not present

## 2021-11-19 LAB — RAD ONC ARIA SESSION SUMMARY
Course Elapsed Days: 12
Plan Fractions Treated to Date: 9
Plan Prescribed Dose Per Fraction: 3 Gy
Plan Total Fractions Prescribed: 10
Plan Total Prescribed Dose: 30 Gy
Reference Point Dosage Given to Date: 27 Gy
Reference Point Session Dosage Given: 3 Gy
Session Number: 9

## 2021-11-19 NOTE — Progress Notes (Signed)
Patient called to inquire about a bill he received and grant through [employer].  Advised patient to turn bill over and advise me of charge description on the bill. These are not charges covered by grant. Advised patient eligible submissions would have been submitted to foundation prior to him getting a bill and that portion is truly his responsibility. He verbalized understanding and advised he would take care of via credit card. Advised patient if he wishes to pay via phone, the [phone] on the front of the bill would be the number to call. He verbalized understanding.  He has my card for any additional financial questions or concerns.

## 2021-11-20 ENCOUNTER — Encounter: Payer: Self-pay | Admitting: Urology

## 2021-11-20 ENCOUNTER — Ambulatory Visit
Admission: RE | Admit: 2021-11-20 | Discharge: 2021-11-20 | Disposition: A | Discharge status: Home / Self Care | Payer: PPO | Source: Ambulatory Visit | Attending: Radiation Oncology | Admitting: Radiation Oncology

## 2021-11-20 ENCOUNTER — Other Ambulatory Visit: Payer: Self-pay

## 2021-11-20 DIAGNOSIS — C61 Malignant neoplasm of prostate: Secondary | ICD-10-CM | POA: Diagnosis not present

## 2021-11-20 DIAGNOSIS — Z191 Hormone sensitive malignancy status: Secondary | ICD-10-CM | POA: Diagnosis not present

## 2021-11-20 DIAGNOSIS — C7951 Secondary malignant neoplasm of bone: Secondary | ICD-10-CM | POA: Diagnosis not present

## 2021-11-20 DIAGNOSIS — Z510 Encounter for antineoplastic radiation therapy: Secondary | ICD-10-CM | POA: Diagnosis not present

## 2021-11-20 LAB — RAD ONC ARIA SESSION SUMMARY
Course Elapsed Days: 13
Plan Fractions Treated to Date: 10
Plan Prescribed Dose Per Fraction: 3 Gy
Plan Total Fractions Prescribed: 10
Plan Total Prescribed Dose: 30 Gy
Reference Point Dosage Given to Date: 30 Gy
Reference Point Session Dosage Given: 3 Gy
Session Number: 10

## 2021-11-22 ENCOUNTER — Inpatient Hospital Stay: Payer: PPO

## 2021-11-22 ENCOUNTER — Inpatient Hospital Stay (HOSPITAL_BASED_OUTPATIENT_CLINIC_OR_DEPARTMENT_OTHER): Payer: PPO | Admitting: Genetic Counselor

## 2021-11-22 ENCOUNTER — Encounter: Payer: Self-pay | Admitting: Genetic Counselor

## 2021-11-22 ENCOUNTER — Other Ambulatory Visit: Payer: Self-pay | Admitting: *Deleted

## 2021-11-22 ENCOUNTER — Other Ambulatory Visit: Payer: Self-pay | Admitting: Genetic Counselor

## 2021-11-22 ENCOUNTER — Other Ambulatory Visit: Payer: Self-pay

## 2021-11-22 DIAGNOSIS — C7951 Secondary malignant neoplasm of bone: Secondary | ICD-10-CM | POA: Insufficient documentation

## 2021-11-22 DIAGNOSIS — Z79899 Other long term (current) drug therapy: Secondary | ICD-10-CM | POA: Diagnosis not present

## 2021-11-22 DIAGNOSIS — Z9221 Personal history of antineoplastic chemotherapy: Secondary | ICD-10-CM | POA: Insufficient documentation

## 2021-11-22 DIAGNOSIS — Z7952 Long term (current) use of systemic steroids: Secondary | ICD-10-CM | POA: Insufficient documentation

## 2021-11-22 DIAGNOSIS — C61 Malignant neoplasm of prostate: Secondary | ICD-10-CM | POA: Insufficient documentation

## 2021-11-22 DIAGNOSIS — G893 Neoplasm related pain (acute) (chronic): Secondary | ICD-10-CM | POA: Insufficient documentation

## 2021-11-22 DIAGNOSIS — I10 Essential (primary) hypertension: Secondary | ICD-10-CM | POA: Diagnosis not present

## 2021-11-22 DIAGNOSIS — Z923 Personal history of irradiation: Secondary | ICD-10-CM | POA: Diagnosis not present

## 2021-11-22 DIAGNOSIS — Z800 Family history of malignant neoplasm of digestive organs: Secondary | ICD-10-CM

## 2021-11-22 HISTORY — DX: Family history of malignant neoplasm of digestive organs: Z80.0

## 2021-11-22 LAB — CMP (CANCER CENTER ONLY)
ALT: 21 U/L (ref 0–44)
AST: 15 U/L (ref 15–41)
Albumin: 4 g/dL (ref 3.5–5.0)
Alkaline Phosphatase: 55 U/L (ref 38–126)
Anion gap: 6 (ref 5–15)
BUN: 15 mg/dL (ref 8–23)
CO2: 25 mmol/L (ref 22–32)
Calcium: 9 mg/dL (ref 8.9–10.3)
Chloride: 106 mmol/L (ref 98–111)
Creatinine: 0.68 mg/dL (ref 0.61–1.24)
GFR, Estimated: >60 mL/min (ref >60)
Glucose, Bld: 143 mg/dL — ABNORMAL HIGH (ref 70–99)
Potassium: 3.6 mmol/L (ref 3.5–5.1)
Sodium: 137 mmol/L (ref 135–145)
Total Bilirubin: 0.5 mg/dL (ref 0.3–1.2)
Total Protein: 6.8 g/dL (ref 6.5–8.1)

## 2021-11-22 LAB — CBC WITH DIFFERENTIAL (CANCER CENTER ONLY)
Abs Immature Granulocytes: 0.02 10*3/uL (ref 0.00–0.07)
Basophils Absolute: 0 10*3/uL (ref 0.0–0.1)
Basophils Relative: 0 %
Eosinophils Absolute: 0.1 10*3/uL (ref 0.0–0.5)
Eosinophils Relative: 2 %
HCT: 34.4 % — ABNORMAL LOW (ref 39.0–52.0)
Hemoglobin: 11.8 g/dL — ABNORMAL LOW (ref 13.0–17.0)
Immature Granulocytes: 0 %
Lymphocytes Relative: 9 %
Lymphs Abs: 0.5 10*3/uL — ABNORMAL LOW (ref 0.7–4.0)
MCH: 32.1 pg (ref 26.0–34.0)
MCHC: 34.3 g/dL (ref 30.0–36.0)
MCV: 93.5 fL (ref 80.0–100.0)
Monocytes Absolute: 0.5 10*3/uL (ref 0.1–1.0)
Monocytes Relative: 8 %
Neutro Abs: 4.8 10*3/uL (ref 1.7–7.7)
Neutrophils Relative %: 81 %
Platelet Count: 167 10*3/uL (ref 150–400)
RBC: 3.68 MIL/uL — ABNORMAL LOW (ref 4.22–5.81)
RDW: 13.5 % (ref 11.5–15.5)
WBC Count: 6 10*3/uL (ref 4.0–10.5)
nRBC: 0 % (ref 0.0–0.2)

## 2021-11-22 LAB — GENETIC SCREENING ORDER

## 2021-11-22 NOTE — Progress Notes (Signed)
REFERRING PROVIDER: [name], MD [place],  [place] [address]  PRIMARY PROVIDER:  [name], MD  PRIMARY REASON FOR VISIT:  Encounter Diagnoses  Name Primary?   Prostate cancer ([place]) Yes   Family history of colon cancer      HISTORY OF PRESENT ILLNESS:   Stephen Collins, a 76 y.o. male, was seen for a [place] cancer genetics consultation at the request of Dr. Alen Blew due to a personal history of metastatic prostate cancer.  Stephen Collins presents to clinic today to discuss the possibility of a hereditary predisposition to cancer, to discuss genetic testing, and to further clarify his future cancer risks, as well as potential cancer risks for family members.   In 2022, at the age of 74, Stephen Collins was diagnosed with adenocarcinoma of the prostate (Gleason 4+5=9) with disease to bone.     CANCER HISTORY:  Oncology History  Prostate cancer ([place])  12/21/2020 Initial Diagnosis   Prostate cancer ([place])   12/21/2020 Cancer Staging   Staging form: Prostate, AJCC 8th Edition - Clinical: Stage IVB (cTX, cN0, pM1c, PSA: 83.2, Grade Group: 5) - Signed by [name], MD on 12/21/2020 Prostate specific antigen (PSA) range: 20 or greater Histologic grading system: 5 grade system   01/09/2021 - 04/27/2021 Chemotherapy   Patient is on Treatment Plan : PROSTATE Docetaxel q21d        RISK FACTORS:  Colonoscopy: yes;  adenoma w/ high grade dysplasia in 2019 .  Past Medical History:  Diagnosis Date   Bilateral renal cysts    Bladder stones    Depression    Frequency of urination    History of kidney stones    Hyperlipidemia    Hypertension    OSA on CPAP    Wears glasses     Past Surgical History:  Procedure Laterality Date   COLONOSCOPY  2006   CYSTOSCOPY WITH LITHOLAPAXY N/A 04/14/2015   Procedure: CYSTOSCOPY WITH LITHOLAPAXY;  Surgeon: Ardis Hughs, MD;  Location: [facility];  Service: Urology;  Laterality: N/A;   FLEXIBLE  SIGMOIDOSCOPY N/A 09/22/2017   Procedure: FLEXIBLE SIGMOIDOSCOPY;  Surgeon: Laurence Spates, MD;  Location: [facility];  Service: Endoscopy;  Laterality: N/A;   IR IMAGING GUIDED PORT INSERTION  01/05/2021   POLYPECTOMY  09/22/2017   Procedure: POLYPECTOMY;  Surgeon: Laurence Spates, MD;  Location: [facility];  Service: Endoscopy;;   SUBMUCOSAL INJECTION  09/22/2017   Procedure: SUBMUCOSAL INJECTION;  Surgeon: Laurence Spates, MD;  Location: [facility];  Service: Endoscopy;;   TONSILLECTOMY  as child    FAMILY HISTORY:  We obtained a detailed, 4-generation family history.  Significant diagnoses are listed below: Family History  Problem Relation Age of Onset   Colon cancer Sister        dx 24s   Leukemia Niece        d. 37s     Stephen Collins is [place] of previous family history of genetic testing for hereditary cancer risks. There is no reported Ashkenazi Jewish ancestry. There is no known consanguinity.  GENETIC COUNSELING ASSESSMENT: Stephen Collins is a 76 y.o. male with a personal history which is somewhat suggestive of a hereditary cancer syndrome and predisposition to cancer given his diagnosis of metastatic prostate cancer . We, therefore, discussed and recommended the following at today's visit.   DISCUSSION: We discussed that 5 - 10% of cancer is hereditary.  Most cases of hereditary prostate cancer are associated with mutations in BRCA1/2.  There are other genes that can be associated with hereditary prostate cancer syndromes.  We discussed that testing is beneficial for several reasons including knowing how to follow individuals for their cancer risks, identifying whether potential treatment options, such as PARP inhibitors, would be beneficial, and understanding if other family members could be at risk for cancer and allowing them to undergo genetic testing.   We reviewed the characteristics, features and inheritance patterns of hereditary cancer syndromes. We also discussed genetic  testing, including the appropriate family members to test, the process of testing, insurance coverage and turn-around-time for results. We discussed the implications of a negative, positive, carrier and/or variant of uncertain significant result. We recommended Stephen Collins pursue genetic testing for a panel that includes genes associated with prostate, colon cancer, and other cancer.    We recommended the [facility] BRCA1/2 STAT Panel with reflex to [facility] Common Hereditary Cancers +RNA Panel.   The Common Hereditary Cancers + RNA Panel offered by [facility] includes sequencing, deletion/duplication, and RNA testing of the following 47 genes: APC, ATM, AXIN2, BARD1, BMPR1A, BRCA1, BRCA2, BRIP1, CDH1, CDK4*, CDKN2A (p14ARF)*, CDKN2A (p16INK4a)*, CHEK2, CTNNA1, DICER1, EPCAM (Deletion/duplication testing only), GREM1 (promoter region deletion/duplication testing only), KIT, MEN1, MLH1, MSH2, MSH3, MSH6, MUTYH, NBN, NF1, NHTL1, PALB2, PDGFRA*, PMS2, POLD1, POLE, PTEN, RAD50, RAD51C, RAD51D, SDHB, SDHC, SDHD, SMAD4, SMARCA4. STK11, TP53, TSC1, TSC2, and VHL.  The following genes were evaluated for sequence changes only: SDHA and HOXB13 c.251G>A variant only.  RNA analysis is not performed for the * genes.     Based on Stephen Collins personal history of prostate cancer, he meets medical criteria for genetic testing. Despite that he meets criteria, he may still have an out of pocket cost. We discussed that if his out of pocket cost for testing is over $100, the laboratory should contact him and discuss the self-pay prices and/or patient pay assistance programs.    PLAN: After considering the risks, benefits, and limitations, Stephen Collins provided informed consent to pursue genetic testing and the blood sample was sent to [facility] for analysis of the BRCA1/2 STAT and Common Hereditary Cancers +RNA Panel. Results should be available within approximately 2 weeks' time, at which point they will be disclosed by  telephone to Stephen Collins, as will any additional recommendations warranted by these results. Stephen Collins will receive a summary of his genetic cou  Stephen Collins questions were answered to his satisfaction today. Our contact information was provided should additional questions or concerns arise. Thank you for the referral and allowing [place] to share in the care of your patient.   Anchor Dwan M. Joette Catching, [place], [facility] Genetic Counselor [email or web address] [phone]) [phone]  The patient was seen for a total of 20 minutes in face-to-face genetic counseling.  The patient was seen alone.  Drs. Lindi Adie and/or Burr Medico were available to discuss this case as needed.    _______________________________________________________________________ For Office Staff:  Number of people involved in session: 1 Was an Intern/ student involved with case: no

## 2021-11-23 LAB — PROSTATE-SPECIFIC AG, SERUM (LABCORP): Prostate Specific Ag, Serum: 21 ng/mL — ABNORMAL HIGH (ref 0.0–4.0)

## 2021-11-26 ENCOUNTER — Telehealth: Payer: Self-pay | Admitting: *Deleted

## 2021-11-26 NOTE — Telephone Encounter (Signed)
LM that Dr Alen Blew can see him 11/30/21 @ 3:00pm

## 2021-11-26 NOTE — Telephone Encounter (Signed)
Daughter is requesting that Stephen Collins be seen sooner than 11/13. They are concerned that his PSA has increased the past 2 times. Want to know about other treatment options. Also states "he continues to be dizzy, light headed and having nausea"

## 2021-11-28 ENCOUNTER — Other Ambulatory Visit (HOSPITAL_COMMUNITY): Payer: Self-pay

## 2021-11-30 ENCOUNTER — Inpatient Hospital Stay (HOSPITAL_BASED_OUTPATIENT_CLINIC_OR_DEPARTMENT_OTHER): Payer: PPO | Admitting: Oncology

## 2021-11-30 VITALS — BP 142/81 | HR 90 | Temp 98.1°F | Resp 19 | Ht 68.0 in | Wt 215.6 lb

## 2021-11-30 DIAGNOSIS — C61 Malignant neoplasm of prostate: Secondary | ICD-10-CM | POA: Diagnosis not present

## 2021-11-30 DIAGNOSIS — C7951 Secondary malignant neoplasm of bone: Secondary | ICD-10-CM | POA: Diagnosis not present

## 2021-11-30 MED ORDER — OXYCODONE HCL 5 MG PO TABS: 5.0000 mg | ORAL_TABLET | ORAL | 0 refills | Status: DC | PRN

## 2021-11-30 NOTE — Progress Notes (Signed)
Hematology and Oncology Follow Up Visit  Stephen Collins [id number] 18-Feb-1945 76 y.o. 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  He was found to have Gleason score 9 and a PSA 57 time of diagnosis.    Prior Therapy: He was on active surveillance and did not receive any additional treatment.  His PSA increased to 83.2 in September 2022 and a PET scan showed diffuse involvement.  Repeat prostate biopsy on November 27, 2020 showed a Gleason score 4+5 = 9.  Eligard 22.5 mg every 3 months started on February 02, 2021.  Taxotere chemotherapy 75 mg per metered squared started on January 09, 2021.  The dose was changed to 60 mg per metered square starting with cycle 4 of therapy.  He completed 6 cycles of therapy in March 2023.  He is status post radiation therapy to the L5 and sacral region.  Completed 30 Gray in 10 fractions in May 2023.  He completed 30 Gray in 10 fractions of radiation to the right hip and proximal femur concluded in October 2023.  Current therapy:  Eligard 30 mg every 4 months with the last injection given on May 18, 2021.  This will be repeated in December 2023.  Zytiga 1000 mg daily with prednisone 5 mg daily started in April 2023.  Xgeva 120 mg every 2 months.  Last injection was given in October 2023.    Interim History: [name] returns today for a follow-up visit.  Since the last visit, he reports a few complaints.  He has reported occasional dizziness, headaches and occasional nausea.  He denies any syncope or fainting episodes.  He denies any chest pain or shortness of breath.  His performance status quality of life remains maintained.  He continues to have diffuse bone pain predominantly lower back and hips.  His pain did improve after radiation.    Medications: Updated on  review. Current Outpatient Medications  Medication Sig Dispense Refill   abiraterone acetate (ZYTIGA) 250 MG tablet Take 4 tablets (1,000 mg total) by mouth daily. Take on an empty stomach 1 hour before or 2 hours after a meal 120 tablet 0   buPROPion (WELLBUTRIN XL) 150 MG 24 hr tablet Take 150 mg by mouth daily.     CALCIUM PO Take 1,200 mg by mouth daily.     cetirizine (ZYRTEC) 10 MG tablet Take 10 mg by mouth daily as needed for allergies.     cholecalciferol (VITAMIN D3) 25 MCG (1000 UNIT) tablet Take 1,000 Units by mouth daily.     irbesartan-hydrochlorothiazide (AVALIDE) 300-12.5 MG tablet Take 1 tablet by mouth every morning.      lidocaine-prilocaine (EMLA) cream Apply 1 application topically as needed. 30 g 0   Multiple Vitamins-Minerals (ADULT GUMMY PO) Take 2 capsules by mouth daily.     naproxen sodium (ALEVE) 220 MG tablet Take 220 mg by mouth daily as needed (pain).     oxyCODONE (OXY IR/ROXICODONE) 5 MG immediate release tablet Take 1 tablet (5 mg total) by mouth every 4 (four) hours as needed for severe pain. 30 tablet 0   predniSONE (DELTASONE) 5 MG tablet Take 1 tablet (5 mg total) by mouth daily with breakfast. 90 tablet 3   prochlorperazine (COMPAZINE) 10 MG tablet TAKE 1 TABLET(10 MG) BY MOUTH EVERY 6 HOURS AS NEEDED FOR NAUSEA OR VOMITING 30 tablet 0  rosuvastatin (CRESTOR) 20 MG tablet Take 20 mg by mouth daily.     traMADol (ULTRAM) 50 MG tablet Take 1 tablet (50 mg total) by mouth every 6 (six) hours as needed. 30 tablet 1   No current facility-administered medications for this visit.   Facility-Administered Medications Ordered in Other Visits  Medication Dose Route Frequency Provider Last Rate Last Admin   0.9 %  sodium chloride infusion   Intravenous Once PRN [name], MD       albuterol (PROVENTIL) (2.5 MG/3ML) 0.083% nebulizer solution 2.5 mg  2.5 mg Nebulization Once PRN [name], MD           Physical Exam:      Blood pressure (!)  142/81, pulse 90, temperature 98.1 F (36.7 C), temperature source Temporal, resp. rate 19, height '5\' 8"'$  (1.727 m), weight 215 lb 9.6 oz (97.8 kg), SpO2 99 %.     ECOG: 1     General appearance: Comfortable appearing without any discomfort Head: Normocephalic without any trauma Oropharynx: Mucous membranes are moist and pink without any thrush or ulcers. Eyes: Pupils are equal and round reactive to light. Lymph nodes: No cervical, supraclavicular, inguinal or axillary lymphadenopathy.   Heart:regular rate and rhythm.  S1 and S2 without leg edema. Lung: Clear without any rhonchi or wheezes.  No dullness to percussion. Abdomin: Soft, nontender, nondistended with good bowel sounds.  No hepatosplenomegaly. Musculoskeletal: No joint deformity or effusion.  Full range of motion noted. Neurological: No deficits noted on motor, sensory and deep tendon reflex exam. Skin: No petechial rash or dryness.  Appeared moist.            Lab Results: Lab Results  Component Value Date   WBC 6.0 11/22/2021   HGB 11.8 (L) 11/22/2021   HCT 34.4 (L) 11/22/2021   MCV 93.5 11/22/2021   PLT 167 11/22/2021     Chemistry      Component Value Date/Time   NA 137 11/22/2021 1426   K 3.6 11/22/2021 1426   CL 106 11/22/2021 1426   CO2 25 11/22/2021 1426   BUN 15 11/22/2021 1426   CREATININE 0.68 11/22/2021 1426      Component Value Date/Time   CALCIUM 9.0 11/22/2021 1426   ALKPHOS 55 11/22/2021 1426   AST 15 11/22/2021 1426   ALT 21 11/22/2021 1426   BILITOT 0.5 11/22/2021 1426        Latest Reference Range & Units 08/07/21 13:09 09/11/21 15:10 11/14/21 08:12 11/22/21 14:26  Prostate Specific Ag, Serum 0.0 - 4.0 ng/mL 4.3 (H) 6.8 (H) 19.6 (H) 21.0 (H)  (H): Data is abnormally high  Impression and Plan:  76 year old with:  1.  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     Treatment choices moving forward were  discussed and his disease status was assessed.  His PSA continues to rise despite being on Zytiga with Guardant360 analysis continues to be pending.  Salvage therapy options including Jevtana, Pluvicto or a PARP inhibitor if he harbors appropriate mutation were discussed.  I recommended discontinuation of Zytiga given the overall PSA rise.  After discussion today, I have opted to proceed with Pluvicto I will make the appropriate referral.  A PARP inhibitor could be also considered if he has the appropriate mutation.    2.  IV access: Port-A-Cath will continue to be flushed periodically.   3.  Bone pain: He finished radiation therapy to the hip and femur.  Prescription for oxycodone will be refilled for him with instructions to take it.   4.  Androgen deprivation therapy: I recommended continuing this indefinitely and he will have repeat injection   5.  Goals of care: His disease is incurable although aggressive measures are warranted given his reasonable performance status.  His disease has been progressive and aggressive and his prognosis is affected by that.  Even with the more aggressive approach he still has life expectancy in the 15 to 18 months on average.  This could be less if he has poor response to any additional treatment.   6.  Bone directed therapy: He is currently on Xgeva.  This will be repeated in December 2023   7.  Follow-up: In the 3 to 4 weeks to follow his progress.   30  minutes were dedicated to this visit.  The time was spent on updating his disease status, treatment choices and outlining future plan of care discussion.  Zola Button, MD 10/20/20232:50 PM

## 2021-12-03 ENCOUNTER — Other Ambulatory Visit (HOSPITAL_COMMUNITY): Payer: Self-pay

## 2021-12-06 ENCOUNTER — Telehealth: Payer: Self-pay

## 2021-12-06 ENCOUNTER — Other Ambulatory Visit (HOSPITAL_COMMUNITY): Payer: Self-pay | Admitting: Oncology

## 2021-12-06 DIAGNOSIS — C61 Malignant neoplasm of prostate: Secondary | ICD-10-CM

## 2021-12-06 NOTE — Telephone Encounter (Signed)
Spoke with Mr. Poer daughter Lanelle Bal about him receiving an IV radiotherapeutic treatment.  Per Freeman Caldron, PA-C she was giving information to contact Dr. Hazeline Junker office in order to get more information about treatment with Radiology and Dr. Alvia Grove office.  Lanelle Bal was appreciative of the call back and agreed to call Dr. Hazeline Junker office.

## 2021-12-10 ENCOUNTER — Telehealth: Payer: Self-pay | Admitting: Genetic Counselor

## 2021-12-10 ENCOUNTER — Encounter: Payer: Self-pay | Admitting: Genetic Counselor

## 2021-12-10 ENCOUNTER — Other Ambulatory Visit: Payer: PPO

## 2021-12-10 ENCOUNTER — Ambulatory Visit: Payer: Self-pay | Admitting: Genetic Counselor

## 2021-12-10 DIAGNOSIS — Z1379 Encounter for other screening for genetic and chromosomal anomalies: Secondary | ICD-10-CM

## 2021-12-10 DIAGNOSIS — Z800 Family history of malignant neoplasm of digestive organs: Secondary | ICD-10-CM

## 2021-12-10 DIAGNOSIS — C61 Malignant neoplasm of prostate: Secondary | ICD-10-CM

## 2021-12-10 NOTE — Progress Notes (Signed)
HPI:   Stephen Collins was previously seen in the [place] clinic due to a personal history of prostate cancer and concerns regarding a hereditary predisposition to cancer. Please refer to our prior cancer genetics clinic note for more information regarding our discussion, assessment and recommendations, at the time. Stephen Collins recent genetic test results were disclosed to him, as were recommendations warranted by these results. These results and recommendations are discussed in more detail below.  CANCER HISTORY:  Oncology History  Prostate cancer ([place])  12/21/2020 Initial Diagnosis   Prostate cancer ([place])   12/21/2020 Cancer Staging   Staging form: Prostate, AJCC 8th Edition - Clinical: Stage IVB (cTX, cN0, pM1c, PSA: 83.2, Grade Group: 5) - Signed by [name], MD on 12/21/2020 Prostate specific antigen (PSA) range: 20 or greater Histologic grading system: 5 grade system   01/09/2021 - 04/27/2021 Chemotherapy   Patient is on Treatment Plan : PROSTATE Docetaxel q21d     12/07/2021 Genetic Testing   Negative genetic testing: no pathogenic variants detected in [facility] Common Hereditary Cancers Panel.  Report date is 12/07/2021.   The Common Hereditary Cancers Panel offered by [facility] includes sequencing and/or deletion duplication testing of the following 47 genes: APC, ATM, AXIN2, BARD1, BMPR1A, BRCA1, BRCA2, BRIP1, CDH1, CDK4, CDKN2A (p14ARF), CDKN2A (p16INK4a), CHEK2, CTNNA1, DICER1, EPCAM (Deletion/duplication testing only), GREM1 (promoter region deletion/duplication testing only), GREM1, HOXB13, KIT, MEN1, MLH1, MSH2, MSH3, MSH6, MUTYH, NBN, NF1, NHTL1, PALB2, PDGFRA, PMS2, POLD1, POLE, PTEN, RAD50, RAD51C, RAD51D, SDHA, SDHB, SDHC, SDHD, SMAD4, SMARCA4. STK11, TP53, TSC1, TSC2, and VHL.  The following genes were evaluated for sequence changes only: SDHA and HOXB13 c.251G>A variant only.es only: SDHA and HOXB13 c.251G>A variant only.     FAMILY HISTORY:  We  obtained a detailed, 4-generation family history.  Significant diagnoses are listed below: Family History  Problem Relation Age of Onset   Colon cancer Sister        dx 53s   Leukemia Niece        d. 81s       Stephen Collins is [place] of previous family history of genetic testing for hereditary cancer risks. There is no reported Ashkenazi Jewish ancestry. There is no known consanguinity.  GENETIC TEST RESULTS:  The [facility] Common Hereditary Cancers Panel found no pathogenic mutations.   The Common Hereditary Cancers Panel offered by [facility] includes sequencing and/or deletion duplication testing of the following 47 genes: APC, ATM, AXIN2, BARD1, BMPR1A, BRCA1, BRCA2, BRIP1, CDH1, CDK4, CDKN2A (p14ARF), CDKN2A (p16INK4a), CHEK2, CTNNA1, DICER1, EPCAM (Deletion/duplication testing only), GREM1 (promoter region deletion/duplication testing only), GREM1, HOXB13, KIT, MEN1, MLH1, MSH2, MSH3, MSH6, MUTYH, NBN, NF1, NHTL1, PALB2, PDGFRA, PMS2, POLD1, POLE, PTEN, RAD50, RAD51C, RAD51D, SDHA, SDHB, SDHC, SDHD, SMAD4, SMARCA4. STK11, TP53, TSC1, TSC2, and VHL.  The following genes were evaluated for sequence changes only: SDHA and HOXB13 c.251G>A variant only.es only: SDHA and HOXB13 c.251G>A variant only.  The test report has been scanned into EPIC and is located under the Molecular Pathology section of the Results Review tab.  A portion of the result report is included below for reference. Genetic testing reported out on December 07, 2021.      Even though a pathogenic variant was not identified, possible explanations for the cancer in the family may include: There may be no hereditary risk for cancer in the family. The cancers in Stephen Collins and/or his family may be sporadic/familial or due to other genetic and environmental factors. There may be  a gene mutation in one of these genes that current testing methods cannot detect but that chance is small. There could be another gene that has not yet been  discovered, or that we have not yet tested, that is responsible for the cancer diagnoses in the family.  It is also possible there is a hereditary cause for the cancer in the family that Stephen Collins did not inherit.  Therefore, it is important to remain in touch with cancer genetics in the future so that we can continue to offer Stephen Collins the most up to date genetic testing.    ADDITIONAL GENETIC TESTING:  We discussed with Stephen Collins that his genetic testing was fairly extensive.  If there are additional relevant genes identified to increase cancer risk that can be analyzed in the future, we would be happy to discuss and coordinate this testing at that time.      CANCER SCREENING RECOMMENDATIONS:  Stephen Collins test result is considered negative (normal).  This means that we have not identified a hereditary cause for his personal history of prostate at this time.   An individual's cancer risk and medical management are not determined by genetic test results alone. Overall cancer risk assessment incorporates additional factors, including personal medical history, family history, and any available genetic information that may result in a personalized plan for cancer prevention and surveillance. Therefore, it is recommended he continue to follow the cancer management and screening guidelines provided by his oncology and primary healthcare provider.  RECOMMENDATIONS FOR FAMILY MEMBERS:   Since he did not inherit a identifiable mutation in a cancer predisposition gene included on this panel, his children could not have inherited a known mutation from him in one of these genes. Individuals in this family might be at some increased risk of developing cancer, over the general population risk, due to the family history of cancer.  Individuals in the family should notify their providers of the family history of cancer. Males in the family should speak with their providers about prostate cancer screening.    First degree relatives of those with colon cancer should receive colonoscopies beginning at age 88, or 10 years prior to the earliest diagnosis of colon cancer in the family, and receive colonoscopies at least every 5 years, or as recommended by their gastroenterologist.     FOLLOW-UP:  Lastly, we discussed with Stephen Collins that cancer genetics is a rapidly advancing field and it is possible that new genetic tests will be appropriate for him and/or his family members in the future. We encouraged him to remain in contact with cancer genetics on an annual basis so we can update his personal and family histories and let him know of advances in cancer genetics that may benefit this family.   Our contact number was provided. Stephen Collins questions were answered to his satisfaction, and he knows he is welcome to call us at anytime with additional questions or concerns.   Malai Lady M. Joette Catching, [place], [facility] Genetic Counselor [email or web address] (P) [phone]

## 2021-12-10 NOTE — Telephone Encounter (Signed)
Revealed negative genetics.   

## 2021-12-11 ENCOUNTER — Other Ambulatory Visit: Payer: Self-pay | Admitting: Oncology

## 2021-12-11 ENCOUNTER — Ambulatory Visit (HOSPITAL_COMMUNITY)
Admission: RE | Admit: 2021-12-11 | Discharge: 2021-12-11 | Disposition: A | Discharge status: Home / Self Care | Payer: PPO | Source: Ambulatory Visit | Attending: Oncology | Admitting: Oncology

## 2021-12-11 DIAGNOSIS — C61 Malignant neoplasm of prostate: Secondary | ICD-10-CM

## 2021-12-12 ENCOUNTER — Encounter: Payer: Self-pay | Admitting: Oncology

## 2021-12-13 LAB — GUARDANT 360

## 2021-12-18 ENCOUNTER — Ambulatory Visit
Admission: RE | Admit: 2021-12-18 | Discharge: 2021-12-18 | Disposition: A | Discharge status: Home / Self Care | Payer: PPO | Source: Ambulatory Visit | Attending: Radiation Oncology | Admitting: Radiation Oncology

## 2021-12-18 NOTE — Progress Notes (Addendum)
  Radiation Oncology         [phone] ________________________________  Name: Stephen Collins MRN: [id number]  Date of Service: 12/18/2021  DOB: 10-Jan-1946  Post Treatment Telephone Note  Diagnosis:  76 y.o. 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   The patient was available for call today.  The patient did not note fatigue during radiation. The patient did not note skin changes in the field of radiation during therapy but is having a mild rash w/ redness, itching and pain 2/10 under his LT axilla x2 days, that is managed w/ a topical power (patient unfamiliar w/ the name). I told the patient to keep an eye on it and to call us if there is any worsening. The patient has noticed improvement in pain in the area(s) treated with radiation. The patient is not taking dexamethasone. The patient does not have symptoms of  weakness or loss of control of the extremities. The patient does not have symptoms of headache. The patient does not have symptoms of seizure or uncontrolled movement. The patient does not have symptoms of changes in vision. The patient does not have changes in speech. The patient does not have confusion.  The patient is scheduled for ongoing care with Dr. Alen Blew on 12/24/2021 in medical oncology. The patient was encouraged to call if he develop concerns or questions regarding radiation.  This concludes the nursing interview.  Leandra Kern, LPN

## 2021-12-24 ENCOUNTER — Inpatient Hospital Stay: Payer: PPO | Attending: Oncology | Admitting: Oncology

## 2021-12-24 ENCOUNTER — Other Ambulatory Visit: Payer: Self-pay

## 2021-12-24 ENCOUNTER — Other Ambulatory Visit: Payer: Self-pay | Admitting: Oncology

## 2021-12-24 VITALS — BP 162/71 | HR 97 | Temp 97.9°F | Resp 16 | Wt 214.1 lb

## 2021-12-24 DIAGNOSIS — C61 Malignant neoplasm of prostate: Secondary | ICD-10-CM | POA: Insufficient documentation

## 2021-12-24 DIAGNOSIS — Z1501 Genetic susceptibility to malignant neoplasm of breast: Secondary | ICD-10-CM | POA: Insufficient documentation

## 2021-12-24 DIAGNOSIS — I10 Essential (primary) hypertension: Secondary | ICD-10-CM | POA: Diagnosis not present

## 2021-12-24 DIAGNOSIS — Z7952 Long term (current) use of systemic steroids: Secondary | ICD-10-CM | POA: Insufficient documentation

## 2021-12-24 DIAGNOSIS — C7951 Secondary malignant neoplasm of bone: Secondary | ICD-10-CM | POA: Insufficient documentation

## 2021-12-24 DIAGNOSIS — G893 Neoplasm related pain (acute) (chronic): Secondary | ICD-10-CM | POA: Diagnosis not present

## 2021-12-24 DIAGNOSIS — Z923 Personal history of irradiation: Secondary | ICD-10-CM | POA: Diagnosis not present

## 2021-12-24 DIAGNOSIS — Z79899 Other long term (current) drug therapy: Secondary | ICD-10-CM | POA: Insufficient documentation

## 2021-12-24 DIAGNOSIS — Z9221 Personal history of antineoplastic chemotherapy: Secondary | ICD-10-CM | POA: Diagnosis not present

## 2021-12-24 NOTE — Progress Notes (Signed)
Hematology and Oncology Follow Up Visit  Stephen Collins [id number] [birth year] 76 y.o. 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  He presented with Gleason score 9 and a PSA 57.  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    Prior Therapy: He was on active surveillance and did not receive any additional treatment.  His PSA increased to 83.2 in September 2022 and a PET scan showed diffuse involvement.  Repeat prostate biopsy on November 27, 2020 showed a Gleason score 4+5 = 9.  Eligard 22.5 mg every 3 months started on February 02, 2021.  Taxotere chemotherapy 75 mg per metered squared started on January 09, 2021.  The dose was changed to 60 mg per metered square starting with cycle 4 of therapy.  He completed 6 cycles of therapy in March 2023.  He is status post radiation therapy to the L5 and sacral region.  Completed 30 Gray in 10 fractions in May 2023.  He completed 30 Gray in 10 fractions of radiation to the right hip and proximal femur concluded in October 2023.  Current therapy:  Eligard 30 mg every 4 months with the last injection given on May 18, 2021.  This will be repeated after January 11, 2022.   Xgeva 120 mg every 2 months.  This will be given in December 2023.  Pluvicto infusion to start in the near future.   Interim History: Stephen Collins is here for a follow-up visit.  Since the last visit, he reports that no major changes in his health.  He denies any nausea, vomiting or abdominal pain.  He does report residual arthralgias and myalgias are manageable with the use of oxycodone.  He uses oxycodone 1-2 times a day and at times he does not take it at all.  Still ambulating and attends to activities of daily living.  His appetite is improving without any major  complaints.    Medications: Reviewed without changes. Current Outpatient Medications  Medication Sig Dispense Refill   abiraterone acetate (ZYTIGA) 250 MG tablet Take 4 tablets (1,000 mg total) by mouth daily. Take on an empty stomach 1 hour before or 2 hours after a meal 120 tablet 0   buPROPion (WELLBUTRIN XL) 150 MG 24 hr tablet Take 150 mg by mouth daily.     CALCIUM PO Take 1,200 mg by mouth daily.     cetirizine (ZYRTEC) 10 MG tablet Take 10 mg by mouth daily as needed for allergies.     cholecalciferol (VITAMIN D3) 25 MCG (1000 UNIT) tablet Take 1,000 Units by mouth daily.     irbesartan-hydrochlorothiazide (AVALIDE) 300-12.5 MG tablet Take 1 tablet by mouth every morning.      lidocaine-prilocaine (EMLA) cream Apply 1 application topically as needed. 30 g 0   Multiple Vitamins-Minerals (ADULT GUMMY PO) Take 2 capsules by mouth daily.     naproxen sodium (ALEVE) 220 MG tablet Take 220 mg by mouth daily as needed (pain).     oxyCODONE (OXY IR/ROXICODONE) 5 MG immediate release tablet Take 1 tablet (5 mg total) by mouth every 4 (four) hours as needed for severe pain. 90 tablet 0   predniSONE (DELTASONE) 5 MG tablet Take 1 tablet (5 mg total) by mouth daily with breakfast. 90 tablet 3   prochlorperazine (COMPAZINE) 10 MG tablet TAKE 1 TABLET(10 MG) BY MOUTH EVERY  6 HOURS AS NEEDED FOR NAUSEA OR VOMITING 30 tablet 0   rosuvastatin (CRESTOR) 20 MG tablet Take 20 mg by mouth daily.     traMADol (ULTRAM) 50 MG tablet Take 1 tablet (50 mg total) by mouth every 6 (six) hours as needed. 30 tablet 1   No current facility-administered medications for this visit.   Facility-Administered Medications Ordered in Other Visits  Medication Dose Route Frequency Provider Last Rate Last Admin   0.9 %  sodium chloride infusion   Intravenous Once PRN [name], MD       albuterol (PROVENTIL) (2.5 MG/3ML) 0.083% nebulizer solution 2.5 mg  2.5 mg Nebulization Once PRN [name], MD            Physical Exam:      Blood pressure (!) 162/71, pulse 97, temperature 97.9 F (36.6 C), temperature source Temporal, resp. rate 16, weight 214 lb 1.6 oz (97.1 kg), SpO2 97 %.      ECOG: 1    General appearance: Alert, awake without any distress. Head: Atraumatic without abnormalities Oropharynx: Without any thrush or ulcers. Eyes: No scleral icterus. Lymph nodes: No lymphadenopathy noted in the cervical, supraclavicular, or axillary nodes Heart:regular rate and rhythm, without any murmurs or gallops.   Lung: Clear to auscultation without any rhonchi, wheezes or dullness to percussion. Abdomin: Soft, nontender without any shifting dullness or ascites. Musculoskeletal: No clubbing or cyanosis. Neurological: No motor or sensory deficits. Skin: No rashes or lesions.            Lab Results: Lab Results  Component Value Date   WBC 6.0 11/22/2021   HGB 11.8 (L) 11/22/2021   HCT 34.4 (L) 11/22/2021   MCV 93.5 11/22/2021   PLT 167 11/22/2021     Chemistry      Component Value Date/Time   NA 137 11/22/2021 1426   K 3.6 11/22/2021 1426   CL 106 11/22/2021 1426   CO2 25 11/22/2021 1426   BUN 15 11/22/2021 1426   CREATININE 0.68 11/22/2021 1426      Component Value Date/Time   CALCIUM 9.0 11/22/2021 1426   ALKPHOS 55 11/22/2021 1426   AST 15 11/22/2021 1426   ALT 21 11/22/2021 1426   BILITOT 0.5 11/22/2021 1426      Latest Reference Range & Units 06/29/21 14:24 08/07/21 13:09 09/11/21 15:10 11/14/21 08:12 11/22/21 14:26  Prostate Specific Ag, Serum 0.0 - 4.0 ng/mL 5.0 (H) 4.3 (H) 6.8 (H) 19.6 (H) 21.0 (H)  (H): Data is abnormally high    Impression and Plan:  76 year old with:  1.  Advanced prostate cancer with disease to the bone and lymphadenopathy diagnosed in 2022.  He has castration-resistant disease.   The natural course of this disease was reviewed at this time and treatment options were discussed.  His PSA continues to rise despite  previous treatment with chemotherapy and Zytiga.  Treatment options moving forward including a PARP inhibitor versus Pluvicto.  Risks and benefits of both approaches were discussed.  Combining the 2 might not be wise given the possibility of myelosuppression and worsening anemia.  After discussion, we opted to proceed with Pluvicto which she will receive every 6 weeks for a total of 6 cycles.  If he has rapid progression of disease during therapy switch to [place] would be considered.  Jevtana chemotherapy would be also an option if he progresses.    2.  IV access: Port-A-Cath will remain in place and flushed periodically.   3.  Bone  pain: He has received radiation therapy for palliative purposes in the past.   4.  Androgen deprivation therapy: This will be repeated in December 2023.  Complications including hot flashes and weight gain were reiterated.   5.  Goals of care: Therapy remains palliative and his prognosis is guarded given the progressive nature of his cancer.  Aggressive measures are warranted however given his reasonable performance status.   6.  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  He will receive Xgeva in December.   7.  Follow-up: He will return in December for injection appointments and MD follow-up in January.   30  minutes were spent on this encounter.  The time was dedicated to reviewing laboratory data, disease status update, treatment choices and complication related to therapy.  Zola Button, MD 11/13/20238:47 AM

## 2021-12-28 ENCOUNTER — Telehealth: Payer: Self-pay | Admitting: *Deleted

## 2021-12-28 DIAGNOSIS — C61 Malignant neoplasm of prostate: Secondary | ICD-10-CM | POA: Diagnosis not present

## 2021-12-28 DIAGNOSIS — F17200 Nicotine dependence, unspecified, uncomplicated: Secondary | ICD-10-CM | POA: Diagnosis not present

## 2021-12-28 DIAGNOSIS — E1169 Type 2 diabetes mellitus with other specified complication: Secondary | ICD-10-CM | POA: Diagnosis not present

## 2021-12-28 DIAGNOSIS — K219 Gastro-esophageal reflux disease without esophagitis: Secondary | ICD-10-CM | POA: Diagnosis not present

## 2021-12-28 DIAGNOSIS — R0789 Other chest pain: Secondary | ICD-10-CM | POA: Diagnosis not present

## 2021-12-28 DIAGNOSIS — E785 Hyperlipidemia, unspecified: Secondary | ICD-10-CM | POA: Diagnosis not present

## 2021-12-28 DIAGNOSIS — I10 Essential (primary) hypertension: Secondary | ICD-10-CM | POA: Diagnosis not present

## 2021-12-28 DIAGNOSIS — E669 Obesity, unspecified: Secondary | ICD-10-CM | POA: Diagnosis not present

## 2021-12-28 NOTE — Telephone Encounter (Signed)
-----   Message from [name], MD sent at 12/28/2021 10:52 AM EST ----- He needs to see his PCP incase this is not related to his cancer ----- Message ----- From: Rolene Course, RN Sent: 12/28/2021  10:38 AM EST To: [name], MD  Stephen Collins called this morning & says he is having some tightness in his chest, states it is not really pain - he has some SOB with exertion which is not new.  He denies this tight sensation radiating anywhere.  He is also C/O of back pain on the R side , this also isn't new but is requiring that he take more pain medication than before.  He is asking if you think he should see a cardiologist for his chest sx's.

## 2021-12-28 NOTE — Telephone Encounter (Signed)
PC to patient, informed him of Dr Hazeline Junker recommendation - he should contact his PCP to be seen.  Patient verbalizes understanding.

## 2022-01-01 ENCOUNTER — Other Ambulatory Visit: Payer: Self-pay

## 2022-01-01 ENCOUNTER — Inpatient Hospital Stay: Payer: PPO

## 2022-01-01 DIAGNOSIS — Z95828 Presence of other vascular implants and grafts: Secondary | ICD-10-CM

## 2022-01-01 DIAGNOSIS — C7951 Secondary malignant neoplasm of bone: Secondary | ICD-10-CM | POA: Diagnosis not present

## 2022-01-01 DIAGNOSIS — C61 Malignant neoplasm of prostate: Secondary | ICD-10-CM

## 2022-01-01 LAB — CMP (CANCER CENTER ONLY)
ALT: 22 U/L (ref 0–44)
AST: 16 U/L (ref 15–41)
Albumin: 4.2 g/dL (ref 3.5–5.0)
Alkaline Phosphatase: 82 U/L (ref 38–126)
Anion gap: 7 (ref 5–15)
BUN: 20 mg/dL (ref 8–23)
CO2: 25 mmol/L (ref 22–32)
Calcium: 9.2 mg/dL (ref 8.9–10.3)
Chloride: 105 mmol/L (ref 98–111)
Creatinine: 0.69 mg/dL (ref 0.61–1.24)
GFR, Estimated: >60 mL/min (ref >60)
Glucose, Bld: 124 mg/dL — ABNORMAL HIGH (ref 70–99)
Potassium: 3.9 mmol/L (ref 3.5–5.1)
Sodium: 137 mmol/L (ref 135–145)
Total Bilirubin: 0.4 mg/dL (ref 0.3–1.2)
Total Protein: 7 g/dL (ref 6.5–8.1)

## 2022-01-01 LAB — CBC WITH DIFFERENTIAL (CANCER CENTER ONLY)
Abs Immature Granulocytes: 0.05 10*3/uL (ref 0.00–0.07)
Basophils Absolute: 0 10*3/uL (ref 0.0–0.1)
Basophils Relative: 0 %
Eosinophils Absolute: 0.1 10*3/uL (ref 0.0–0.5)
Eosinophils Relative: 1 %
HCT: 35.4 % — ABNORMAL LOW (ref 39.0–52.0)
Hemoglobin: 12.2 g/dL — ABNORMAL LOW (ref 13.0–17.0)
Immature Granulocytes: 1 %
Lymphocytes Relative: 9 %
Lymphs Abs: 0.7 10*3/uL (ref 0.7–4.0)
MCH: 31.8 pg (ref 26.0–34.0)
MCHC: 34.5 g/dL (ref 30.0–36.0)
MCV: 92.2 fL (ref 80.0–100.0)
Monocytes Absolute: 0.6 10*3/uL (ref 0.1–1.0)
Monocytes Relative: 8 %
Neutro Abs: 5.8 10*3/uL (ref 1.7–7.7)
Neutrophils Relative %: 81 %
Platelet Count: 190 10*3/uL (ref 150–400)
RBC: 3.84 MIL/uL — ABNORMAL LOW (ref 4.22–5.81)
RDW: 14.1 % (ref 11.5–15.5)
WBC Count: 7.2 10*3/uL (ref 4.0–10.5)
nRBC: 0 % (ref 0.0–0.2)

## 2022-01-01 MED ORDER — HEPARIN SOD (PORK) LOCK FLUSH 100 UNIT/ML IV SOLN
500.0000 [IU] | Freq: Once | INTRAVENOUS | Status: AC
  Administered 2022-01-01: 500 [IU]

## 2022-01-01 MED ORDER — SODIUM CHLORIDE 0.9% FLUSH
10.0000 mL | Freq: Once | INTRAVENOUS | Status: AC
  Administered 2022-01-01: 10 mL

## 2022-01-02 LAB — PROSTATE-SPECIFIC AG, SERUM (LABCORP): Prostate Specific Ag, Serum: 80.9 ng/mL — ABNORMAL HIGH (ref 0.0–4.0)

## 2022-01-03 ENCOUNTER — Other Ambulatory Visit: Payer: Self-pay | Admitting: Oncology

## 2022-01-06 ENCOUNTER — Encounter: Payer: Self-pay | Admitting: Oncology

## 2022-01-07 ENCOUNTER — Telehealth: Payer: Self-pay

## 2022-01-07 ENCOUNTER — Other Ambulatory Visit: Payer: Self-pay

## 2022-01-07 ENCOUNTER — Other Ambulatory Visit: Payer: Self-pay | Admitting: Oncology

## 2022-01-07 DIAGNOSIS — C61 Malignant neoplasm of prostate: Secondary | ICD-10-CM

## 2022-01-07 MED ORDER — OXYCODONE HCL 10 MG PO TABS: 10.0000 mg | ORAL_TABLET | ORAL | 0 refills | Status: DC | PRN

## 2022-01-07 MED ORDER — ONDANSETRON HCL 8 MG PO TABS: 4.0000 mg | ORAL_TABLET | Freq: Three times a day (TID) | ORAL | 3 refills | Status: AC | PRN

## 2022-01-07 NOTE — Telephone Encounter (Signed)
Called and spoke with patient's daughter regarding the Mychart message that was sent. All questions and concerns were addressed. Zofran was sent in to the patient's pharmacy. Oxycodone refill sent to Dr. Alen Blew. No further concerns at this time.

## 2022-01-08 NOTE — Written Directive (Cosign Needed)
.   PLUVICTO  THERAPY   RADIOPHARMACEUTICAL: Lutetium 177 vipivotide tetraxetan (Pluvicto)     PRESCRIBED DOSE FOR ADMINISTRATION:  200 mCi   ROUTE OFADMINISTRATION:  IV   DIAGNOSIS:  Metastatic Prostate Cancer    REFERRING PHYSICIAN: Zola Button, MD   TREATMENT #: 1   ADDITIONAL PHYSICIAN COMMENTS/NOTES:   AUTHORIZED USER SIGNATURE & TIME STAMP: Rennis Golden, MD   01/09/22    9:14 AM

## 2022-01-09 ENCOUNTER — Ambulatory Visit (HOSPITAL_COMMUNITY)
Admission: RE | Admit: 2022-01-09 | Discharge: 2022-01-09 | Disposition: A | Discharge status: Home / Self Care | Payer: PPO | Source: Ambulatory Visit | Attending: Oncology | Admitting: Oncology

## 2022-01-09 DIAGNOSIS — C61 Malignant neoplasm of prostate: Secondary | ICD-10-CM | POA: Diagnosis not present

## 2022-01-09 MED ORDER — SODIUM CHLORIDE 0.9 % IV BOLUS: 1000.0000 mL | Freq: Once | INTRAVENOUS | Status: DC

## 2022-01-09 MED ORDER — LUTETIUM LU 177 VIPIVOTIDE TET 1000 MBQ/ML IV SOLN
204.0000 | Freq: Once | INTRAVENOUS | Status: AC
  Administered 2022-01-09: 204 via INTRAVENOUS

## 2022-01-09 NOTE — Progress Notes (Signed)
CLINICAL DATA: [Metastatic prostate carcinoma.  Patient status post chemotherapy.  Progression post chemotherapy.  Multiple foci of radiotracer avid metastatic prostate cancer on PSMA PET scan are involving the skeleton.]  PSA elevated.   EXAM: NUCLEAR MEDICINE PLUVICTO INJECTION  TECHNIQUE: Infusion: The nuclear medicine technologist and I personally verified the dose activity to be delivered as specified in the written directive, and verified the patient identification via 2 separate methods.  Initial flush of the intravenous catheter was performed was sterile saline. The dose syringe was connected to the catheter and the Lu-177 Pluvicto administered over a 1 to 10 min infusion. Single 10 cc  lushes with normal saline follow the dose. No complications were noted. The entire IV tubing, venocatheter, stopcock and syringes was removed in total, placed in a disposal bag and sent for assay of the residual activity, which will be reported at a later time in our EMR by the physics staff. Pressure was applied to the venipuncture site, and a compression bandage placed. Patient monitored for 1 hour following infusion.    Radiation Safety personnel were present to perform the discharge survey, as detailed on their documentation. After a short period of observation, the patient had his IV removed.  RADIOPHARMACEUTICALS: [204] microcuries Lu-177 PLUVICTO  FINDINGS: Current Infusion: [1]  Planned Infusions: 6    Patient presented to nuclear medicine for treatment. The patient's most recent blood counts were reviewed and remains a good candidate to proceed with Lu-177 Pluvicto.       Creatinine, CBC and bilirubin normal.     PSA elevated at 80.     The patient was situated in an infusion suite with a contact barrier placed under the arm. Intravenous access was established, using sterile technique, and a normal saline infusion from a syringe was started.     Micro-dosimetry: The prescribed  radiation activity was assayed and confirmed to be within specified tolerance.  IMPRESSION: Current Infusion: [1]  Planned Infusions: 6    [The patient tolerated the infusion well. The patient will return in 6 weeks for ongoing care.]

## 2022-01-10 ENCOUNTER — Telehealth: Payer: Self-pay | Admitting: Oncology

## 2022-01-10 NOTE — Telephone Encounter (Signed)
Called patient regarding  all upcoming appointments. Left a voicemail.

## 2022-01-11 ENCOUNTER — Other Ambulatory Visit: Payer: Self-pay | Admitting: Oncology

## 2022-01-15 ENCOUNTER — Other Ambulatory Visit: Payer: Self-pay | Admitting: Oncology

## 2022-01-18 ENCOUNTER — Other Ambulatory Visit: Payer: Self-pay

## 2022-01-18 ENCOUNTER — Ambulatory Visit: Payer: PPO | Admitting: Oncology

## 2022-01-18 ENCOUNTER — Inpatient Hospital Stay: Payer: PPO | Attending: Oncology

## 2022-01-18 VITALS — BP 120/61 | HR 85 | Temp 98.7°F | Resp 20

## 2022-01-18 DIAGNOSIS — Z923 Personal history of irradiation: Secondary | ICD-10-CM | POA: Insufficient documentation

## 2022-01-18 DIAGNOSIS — R059 Cough, unspecified: Secondary | ICD-10-CM | POA: Insufficient documentation

## 2022-01-18 DIAGNOSIS — Z79899 Other long term (current) drug therapy: Secondary | ICD-10-CM | POA: Diagnosis not present

## 2022-01-18 DIAGNOSIS — Z95828 Presence of other vascular implants and grafts: Secondary | ICD-10-CM

## 2022-01-18 DIAGNOSIS — C61 Malignant neoplasm of prostate: Secondary | ICD-10-CM | POA: Insufficient documentation

## 2022-01-18 DIAGNOSIS — Z9221 Personal history of antineoplastic chemotherapy: Secondary | ICD-10-CM | POA: Insufficient documentation

## 2022-01-18 DIAGNOSIS — Z7952 Long term (current) use of systemic steroids: Secondary | ICD-10-CM | POA: Diagnosis not present

## 2022-01-18 DIAGNOSIS — Z1501 Genetic susceptibility to malignant neoplasm of breast: Secondary | ICD-10-CM | POA: Diagnosis not present

## 2022-01-18 DIAGNOSIS — L259 Unspecified contact dermatitis, unspecified cause: Secondary | ICD-10-CM | POA: Diagnosis not present

## 2022-01-18 DIAGNOSIS — G893 Neoplasm related pain (acute) (chronic): Secondary | ICD-10-CM | POA: Diagnosis not present

## 2022-01-18 DIAGNOSIS — I10 Essential (primary) hypertension: Secondary | ICD-10-CM | POA: Diagnosis not present

## 2022-01-18 DIAGNOSIS — R634 Abnormal weight loss: Secondary | ICD-10-CM | POA: Diagnosis not present

## 2022-01-18 DIAGNOSIS — C7951 Secondary malignant neoplasm of bone: Secondary | ICD-10-CM | POA: Diagnosis not present

## 2022-01-18 DIAGNOSIS — R0982 Postnasal drip: Secondary | ICD-10-CM | POA: Insufficient documentation

## 2022-01-18 MED ORDER — DENOSUMAB 120 MG/1.7ML ~~LOC~~ SOLN
120.0000 mg | Freq: Once | SUBCUTANEOUS | Status: AC
  Administered 2022-01-18: 120 mg via SUBCUTANEOUS
  Filled 2022-01-18: qty 1.7

## 2022-01-18 MED ORDER — LEUPROLIDE ACETATE (4 MONTH) 30 MG ~~LOC~~ KIT
30.0000 mg | PACK | Freq: Once | SUBCUTANEOUS | Status: AC
  Administered 2022-01-18: 30 mg via SUBCUTANEOUS
  Filled 2022-01-18: qty 30

## 2022-01-22 ENCOUNTER — Encounter: Payer: Self-pay | Admitting: Oncology

## 2022-01-23 ENCOUNTER — Encounter: Payer: Self-pay | Admitting: *Deleted

## 2022-01-24 ENCOUNTER — Inpatient Hospital Stay (HOSPITAL_BASED_OUTPATIENT_CLINIC_OR_DEPARTMENT_OTHER): Payer: PPO | Admitting: Oncology

## 2022-01-24 ENCOUNTER — Other Ambulatory Visit: Payer: Self-pay

## 2022-01-24 ENCOUNTER — Inpatient Hospital Stay: Payer: PPO

## 2022-01-24 VITALS — BP 138/72 | HR 83 | Temp 97.7°F | Resp 18 | Ht 68.0 in | Wt 204.5 lb

## 2022-01-24 DIAGNOSIS — C61 Malignant neoplasm of prostate: Secondary | ICD-10-CM | POA: Diagnosis not present

## 2022-01-24 DIAGNOSIS — C7951 Secondary malignant neoplasm of bone: Secondary | ICD-10-CM | POA: Diagnosis not present

## 2022-01-24 DIAGNOSIS — Z95828 Presence of other vascular implants and grafts: Secondary | ICD-10-CM

## 2022-01-24 LAB — CMP (CANCER CENTER ONLY)
ALT: 21 U/L (ref 0–44)
AST: 14 U/L — ABNORMAL LOW (ref 15–41)
Albumin: 3.9 g/dL (ref 3.5–5.0)
Alkaline Phosphatase: 128 U/L — ABNORMAL HIGH (ref 38–126)
Anion gap: 6 (ref 5–15)
BUN: 17 mg/dL (ref 8–23)
CO2: 26 mmol/L (ref 22–32)
Calcium: 8.9 mg/dL (ref 8.9–10.3)
Chloride: 106 mmol/L (ref 98–111)
Creatinine: 0.71 mg/dL (ref 0.61–1.24)
GFR, Estimated: >60 mL/min (ref >60)
Glucose, Bld: 132 mg/dL — ABNORMAL HIGH (ref 70–99)
Potassium: 4.3 mmol/L (ref 3.5–5.1)
Sodium: 138 mmol/L (ref 135–145)
Total Bilirubin: 0.4 mg/dL (ref 0.3–1.2)
Total Protein: 6.4 g/dL — ABNORMAL LOW (ref 6.5–8.1)

## 2022-01-24 LAB — CBC WITH DIFFERENTIAL (CANCER CENTER ONLY)
Abs Immature Granulocytes: 0.05 10*3/uL (ref 0.00–0.07)
Basophils Absolute: 0 10*3/uL (ref 0.0–0.1)
Basophils Relative: 0 %
Eosinophils Absolute: 0.1 10*3/uL (ref 0.0–0.5)
Eosinophils Relative: 1 %
HCT: 34.9 % — ABNORMAL LOW (ref 39.0–52.0)
Hemoglobin: 12 g/dL — ABNORMAL LOW (ref 13.0–17.0)
Immature Granulocytes: 1 %
Lymphocytes Relative: 10 %
Lymphs Abs: 0.5 10*3/uL — ABNORMAL LOW (ref 0.7–4.0)
MCH: 31.8 pg (ref 26.0–34.0)
MCHC: 34.4 g/dL (ref 30.0–36.0)
MCV: 92.6 fL (ref 80.0–100.0)
Monocytes Absolute: 0.5 10*3/uL (ref 0.1–1.0)
Monocytes Relative: 9 %
Neutro Abs: 3.9 10*3/uL (ref 1.7–7.7)
Neutrophils Relative %: 79 %
Platelet Count: 190 10*3/uL (ref 150–400)
RBC: 3.77 MIL/uL — ABNORMAL LOW (ref 4.22–5.81)
RDW: 14.1 % (ref 11.5–15.5)
WBC Count: 5 10*3/uL (ref 4.0–10.5)
nRBC: 0 % (ref 0.0–0.2)

## 2022-01-24 MED ORDER — TRIAMCINOLONE ACETONIDE 0.5 % EX OINT: 1.0000 | TOPICAL_OINTMENT | Freq: Two times a day (BID) | CUTANEOUS | 1 refills | Status: AC

## 2022-01-24 MED ORDER — GUAIFENESIN ER 600 MG PO TB12: 600.0000 mg | ORAL_TABLET | Freq: Two times a day (BID) | ORAL | 1 refills | Status: AC

## 2022-01-24 MED ORDER — HEPARIN SOD (PORK) LOCK FLUSH 100 UNIT/ML IV SOLN
500.0000 [IU] | Freq: Once | INTRAVENOUS | Status: AC
  Administered 2022-01-24: 500 [IU]

## 2022-01-24 MED ORDER — SODIUM CHLORIDE 0.9% FLUSH
10.0000 mL | Freq: Once | INTRAVENOUS | Status: AC
  Administered 2022-01-24: 10 mL

## 2022-01-24 NOTE — Progress Notes (Signed)
Hematology and Oncology Follow Up Visit  Stephen Collins [id number] Feb 18, 1945 76 y.o. 01/24/2022 9:09 AM [name], [name] Ready, MDAvva, [name], MD   Principle Diagnosis: 76 year old with man with advanced prostate cancer with disease to the bone diagnosed in 2022.  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    Prior Therapy: He was on active surveillance and did not receive any additional treatment.  His PSA increased to 83.2 in September 2022 and a PET scan showed diffuse involvement.  Repeat prostate biopsy on November 27, 2020 showed a Gleason score 4+5 = 9.  Eligard 22.5 mg every 3 months started on February 02, 2021.  Taxotere chemotherapy 75 mg per metered squared started on January 09, 2021.  The dose was changed to 60 mg per metered square starting with cycle 4 of therapy.  He completed 6 cycles of therapy in March 2023.  He is status post radiation therapy to the L5 and sacral region.  Completed 30 Gray in 10 fractions in May 2023.  He completed 30 Gray in 10 fractions of radiation to the right hip and proximal femur concluded in October 2023.  Current therapy:  Eligard 30 mg every 4 months with the last injection given on May 18, 2021.  Last injection was given on January 18, 2022.   Xgeva 120 mg every 2 months.  This was given on January 18, 2022.  Pluvicto infusion started on January 09, 2022.   Interim History: [name] returns today for a follow-up visit.  Since the last visit, he reports that no major changes in his health.  He received the first injection of Pluvicto without any complications.  He has reported back pain across his back which has resolved at this time.  He did did notice some irritation under the right axilla.  He denies any fevers or chills or sweats.  He has reported excessive mucus and nasal drainage causing cough and postnasal drip.  His performance  status quality of life remains unchanged.  His appetite remains reasonable although lost some weight.    Medications: Updated on review. Current Outpatient Medications  Medication Sig Dispense Refill   abiraterone acetate (ZYTIGA) 250 MG tablet Take 4 tablets (1,000 mg total) by mouth daily. Take on an empty stomach 1 hour before or 2 hours after a meal 120 tablet 0   buPROPion (WELLBUTRIN XL) 150 MG 24 hr tablet Take 150 mg by mouth daily.     CALCIUM PO Take 1,200 mg by mouth daily.     cetirizine (ZYRTEC) 10 MG tablet Take 10 mg by mouth daily as needed for allergies.     cholecalciferol (VITAMIN D3) 25 MCG (1000 UNIT) tablet Take 1,000 Units by mouth daily.     irbesartan-hydrochlorothiazide (AVALIDE) 300-12.5 MG tablet Take 1 tablet by mouth every morning.      lidocaine-prilocaine (EMLA) cream Apply 1 application topically as needed. 30 g 0   Multiple Vitamins-Minerals (ADULT GUMMY PO) Take 2 capsules by mouth daily.     naproxen sodium (ALEVE) 220 MG tablet Take 220 mg by mouth daily as needed (pain).     ondansetron (ZOFRAN) 8 MG tablet Take 0.5 tablets (4 mg total) by mouth every 8 (eight) hours as needed for nausea or vomiting. 30 tablet 3   oxyCODONE 10 MG TABS Take 1 tablet (10 mg total) by mouth every 4 (four) hours as needed for severe pain. 90 tablet 0  predniSONE (DELTASONE) 5 MG tablet Take 1 tablet (5 mg total) by mouth daily with breakfast. 90 tablet 3   prochlorperazine (COMPAZINE) 10 MG tablet TAKE 1 TABLET(10 MG) BY MOUTH EVERY 6 HOURS AS NEEDED FOR NAUSEA OR VOMITING 30 tablet 0   rosuvastatin (CRESTOR) 20 MG tablet Take 20 mg by mouth daily.     traMADol (ULTRAM) 50 MG tablet Take 1 tablet (50 mg total) by mouth every 6 (six) hours as needed. 30 tablet 1   No current facility-administered medications for this visit.   Facility-Administered Medications Ordered in Other Visits  Medication Dose Route Frequency Provider Last Rate Last Admin   0.9 %  sodium chloride  infusion   Intravenous Once PRN [name], MD       albuterol (PROVENTIL) (2.5 MG/3ML) 0.083% nebulizer solution 2.5 mg  2.5 mg Nebulization Once PRN [name], MD           Physical Exam:       Blood pressure 138/72, pulse 83, temperature 97.7 F (36.5 C), temperature source Temporal, resp. rate 18, height _0  (1.727 m), weight 204 lb 8 oz (92.8 kg), SpO2 96 %.      ECOG: 1   General appearance: Comfortable appearing without any discomfort Head: Normocephalic without any trauma Oropharynx: Mucous membranes are moist and pink without any thrush or ulcers. Eyes: Pupils are equal and round reactive to light. Lymph nodes: No cervical, supraclavicular, inguinal or axillary lymphadenopathy.   Heart:regular rate and rhythm.  S1 and S2 without leg edema. Lung: Clear without any rhonchi or wheezes.  No dullness to percussion. Abdomin: Soft, nontender, nondistended with good bowel sounds.  No hepatosplenomegaly. Musculoskeletal: No joint deformity or effusion.  Full range of motion noted. Neurological: No deficits noted on motor, sensory and deep tendon reflex exam. Skin: Erythema noted around his right axilla.  No masses or abscesses noted.  No lymphadenopathy.            Lab Results: Lab Results  Component Value Date   WBC 7.2 01/01/2022   HGB 12.2 (L) 01/01/2022   HCT 35.4 (L) 01/01/2022   MCV 92.2 01/01/2022   PLT 190 01/01/2022     Chemistry      Component Value Date/Time   NA 137 01/01/2022 1310   K 3.9 01/01/2022 1310   CL 105 01/01/2022 1310   CO2 25 01/01/2022 1310   BUN 20 01/01/2022 1310   CREATININE 0.69 01/01/2022 1310      Component Value Date/Time   CALCIUM 9.2 01/01/2022 1310   ALKPHOS 82 01/01/2022 1310   AST 16 01/01/2022 1310   ALT 22 01/01/2022 1310   BILITOT 0.4 01/01/2022 1310        Impression and Plan:  76 year old with:  1.  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     He has progressed on multiple therapies outlined above and currently receiving Pluvicto.  Risks and benefits of continuing this treatment were discussed at this time.  Alternative treatment options including Lonie Peak given his BRCA mutation as well as Jevtana chemotherapy.  He had poor tolerance to chemotherapy previously and I would like to avoid it.  After discussion today, he will continue with Pluvicto and use Lynparza as a salvage option moving forward.    2.  IV access: Port-A-Cath will continue to be in use and flush periodically.   3.  Bone pain: Manageable at this time with oxycodone use  1-2 twice a day periodically.   4.  Androgen deprivation therapy: He is up-to-date and received Eligard on December 8.  5.  Goals of care: His disease remains incurable and rather aggressive in course.  Any treatment is palliative at this time.   6.  Bone directed therapy: He is currently on Xgeva which will continue to be repeated every 2 months.  7.  Contact dermatitis around the right axilla: Triamcinolone cream will be available to him.  8.  Postnasal drip and cough: This appears to be more allergy rather than infection.  He will take cetirizine daily as well as Mucinex which we prescribed to him.  9.  Genetic considerations: He does have BRCA1 somatic mutation.  Genetic counseling completed on October 12 and it did not show any germline mutation.  I still recommended BRCA testing for his daughters.   10.  Follow-up: He will return on January 3 for repeat follow-up.   30  minutes were dedicated to this visit.  The time was spent on updating disease status, treatment choices and outlining future plan of care discussion.  Zola Button, MD 12/14/20239:09 AM

## 2022-01-26 LAB — PROSTATE-SPECIFIC AG, SERUM (LABCORP): Prostate Specific Ag, Serum: 207 ng/mL — ABNORMAL HIGH (ref 0.0–4.0)

## 2022-01-27 ENCOUNTER — Encounter: Payer: Self-pay | Admitting: Oncology

## 2022-01-28 ENCOUNTER — Other Ambulatory Visit: Payer: Self-pay | Admitting: *Deleted

## 2022-01-28 DIAGNOSIS — C61 Malignant neoplasm of prostate: Secondary | ICD-10-CM

## 2022-01-30 ENCOUNTER — Inpatient Hospital Stay (HOSPITAL_BASED_OUTPATIENT_CLINIC_OR_DEPARTMENT_OTHER): Payer: PPO | Admitting: Nurse Practitioner

## 2022-01-30 ENCOUNTER — Other Ambulatory Visit: Payer: Self-pay

## 2022-01-30 ENCOUNTER — Encounter: Payer: Self-pay | Admitting: Nurse Practitioner

## 2022-01-30 VITALS — BP 117/61 | HR 82 | Temp 97.7°F | Resp 16 | Wt 202.2 lb

## 2022-01-30 DIAGNOSIS — R630 Anorexia: Secondary | ICD-10-CM

## 2022-01-30 DIAGNOSIS — R530 Neoplastic (malignant) related fatigue: Secondary | ICD-10-CM

## 2022-01-30 DIAGNOSIS — Z515 Encounter for palliative care: Secondary | ICD-10-CM | POA: Diagnosis not present

## 2022-01-30 DIAGNOSIS — R110 Nausea: Secondary | ICD-10-CM | POA: Diagnosis not present

## 2022-01-30 DIAGNOSIS — K5900 Constipation, unspecified: Secondary | ICD-10-CM

## 2022-01-30 MED ORDER — PANTOPRAZOLE SODIUM 40 MG PO TBEC: 40.0000 mg | DELAYED_RELEASE_TABLET | Freq: Every day | ORAL | 2 refills | Status: AC

## 2022-01-30 MED ORDER — METOCLOPRAMIDE HCL 10 MG PO TABS: 10.0000 mg | ORAL_TABLET | Freq: Three times a day (TID) | ORAL | 2 refills | Status: AC

## 2022-01-30 NOTE — Patient Instructions (Addendum)
-   pick up and take protonix to help nausea symptoms, take in addition to nausea medications - you can alternate between compazine every 6 hours and zofran every 8 hours for nausea control  - BRAT diet with nausea, (banana, rice, apple sauce, and toast) or small frequent meals throughout the day, keeping something on your stomach can help with nausea, not much to avoid at this point - pick up and take reglan, take 3 times a day preferable 30-45 minutes before a meal - continue to take mirilax, take every day as needed, you should have a BM every day or every other day

## 2022-01-31 ENCOUNTER — Encounter: Payer: Self-pay | Admitting: Oncology

## 2022-01-31 NOTE — Progress Notes (Signed)
[place]  Telephone:(336) [phone] [phone]) [phone]   Name: Stephen Collins Date: 01/31/2022 MRN: [id number]  DOB: [birth year]  Patient Care Team: [name], MD as PCP - General (Internal Medicine) Katheren Puller, RN as Oncology Nurse Navigator    REASON FOR CONSULTATION: Stephen Collins is a 76 y.o. male with oncologic medical history including advanced prostate cancer with bone involvement (2022) s/p chemotherapy which he completed 04/2021, radiation to sacral region/L5 (06/2021), right hip and femur (11/2021).  Palliative ask to see for symptom management and goals of care.    SOCIAL HISTORY:     reports that he has been smoking cigarettes. He has a 24.00 pack-year smoking history. He has never used smokeless tobacco. He reports current alcohol use of about 12.0 standard drinks of alcohol per week. He reports that he does not use drugs.  ADVANCE DIRECTIVES:    CODE STATUS:   PAST MEDICAL HISTORY: Past Medical History:  Diagnosis Date   Bilateral renal cysts    Bladder stones    Depression    Family history of colon cancer 11/22/2021   Frequency of urination    History of kidney stones    Hyperlipidemia    Hypertension    OSA on CPAP    Wears glasses     PAST SURGICAL HISTORY:  Past Surgical History:  Procedure Laterality Date   COLONOSCOPY  2006   CYSTOSCOPY WITH LITHOLAPAXY N/A 04/14/2015   Procedure: CYSTOSCOPY WITH LITHOLAPAXY;  Surgeon: Ardis Hughs, MD;  Location: [facility];  Service: Urology;  Laterality: N/A;   FLEXIBLE SIGMOIDOSCOPY N/A 09/22/2017   Procedure: FLEXIBLE SIGMOIDOSCOPY;  Surgeon: Laurence Spates, MD;  Location: [facility];  Service: Endoscopy;  Laterality: N/A;   IR IMAGING GUIDED PORT INSERTION  01/05/2021   POLYPECTOMY  09/22/2017   Procedure: POLYPECTOMY;  Surgeon: Laurence Spates, MD;  Location: [facility];  Service: Endoscopy;;   SUBMUCOSAL INJECTION  09/22/2017    Procedure: SUBMUCOSAL INJECTION;  Surgeon: Laurence Spates, MD;  Location: [facility];  Service: Endoscopy;;   TONSILLECTOMY  as child    HEMATOLOGY/ONCOLOGY HISTORY:  Oncology History  Prostate cancer ([place])  12/21/2020 Initial Diagnosis   Prostate cancer ([place])   12/21/2020 Cancer Staging   Staging form: Prostate, AJCC 8th Edition - Clinical: Stage IVB (cTX, cN0, pM1c, PSA: 83.2, Grade Group: 5) - Signed by [name], MD on 12/21/2020 Prostate specific antigen (PSA) range: 20 or greater Histologic grading system: 5 grade system   01/09/2021 - 04/27/2021 Chemotherapy   Patient is on Treatment Plan : PROSTATE Docetaxel q21d     12/07/2021 Genetic Testing   Negative genetic testing: no pathogenic variants detected in [facility] Common Hereditary Cancers Panel.  Report date is 12/07/2021.   The Common Hereditary Cancers Panel offered by [facility] includes sequencing and/or deletion duplication testing of the following 47 genes: APC, ATM, AXIN2, BARD1, BMPR1A, BRCA1, BRCA2, BRIP1, CDH1, CDK4, CDKN2A (p14ARF), CDKN2A (p16INK4a), CHEK2, CTNNA1, DICER1, EPCAM (Deletion/duplication testing only), GREM1 (promoter region deletion/duplication testing only), GREM1, HOXB13, KIT, MEN1, MLH1, MSH2, MSH3, MSH6, MUTYH, NBN, NF1, NHTL1, PALB2, PDGFRA, PMS2, POLD1, POLE, PTEN, RAD50, RAD51C, RAD51D, SDHA, SDHB, SDHC, SDHD, SMAD4, SMARCA4. STK11, TP53, TSC1, TSC2, and VHL.  The following genes were evaluated for sequence changes only: SDHA and HOXB13 c.251G>A variant only.es only: SDHA and HOXB13 c.251G>A variant only.     ALLERGIES:  has No Known Allergies.  MEDICATIONS:  Current Outpatient Medications  Medication Sig Dispense Refill  metoCLOPramide (REGLAN) 10 MG tablet Take 1 tablet (10 mg total) by mouth 4 (four) times daily -  before meals and at bedtime. 60 tablet 2   pantoprazole (PROTONIX) 40 MG tablet Take 1 tablet (40 mg total) by mouth daily. 30 tablet 2   abiraterone acetate (ZYTIGA) 250 MG  tablet Take 4 tablets (1,000 mg total) by mouth daily. Take on an empty stomach 1 hour before or 2 hours after a meal 120 tablet 0   buPROPion (WELLBUTRIN XL) 150 MG 24 hr tablet Take 150 mg by mouth daily.     CALCIUM PO Take 1,200 mg by mouth daily.     cetirizine (ZYRTEC) 10 MG tablet Take 10 mg by mouth daily as needed for allergies.     cholecalciferol (VITAMIN D3) 25 MCG (1000 UNIT) tablet Take 1,000 Units by mouth daily.     guaiFENesin (MUCINEX) 600 MG 12 hr tablet Take 1 tablet (600 mg total) by mouth 2 (two) times daily. 28 tablet 1   irbesartan-hydrochlorothiazide (AVALIDE) 300-12.5 MG tablet Take 1 tablet by mouth every morning.      lidocaine-prilocaine (EMLA) cream Apply 1 application topically as needed. 30 g 0   Multiple Vitamins-Minerals (ADULT GUMMY PO) Take 2 capsules by mouth daily.     naproxen sodium (ALEVE) 220 MG tablet Take 220 mg by mouth daily as needed (pain).     ondansetron (ZOFRAN) 8 MG tablet Take 0.5 tablets (4 mg total) by mouth every 8 (eight) hours as needed for nausea or vomiting. 30 tablet 3   oxyCODONE 10 MG TABS Take 1 tablet (10 mg total) by mouth every 4 (four) hours as needed for severe pain. 90 tablet 0   predniSONE (DELTASONE) 5 MG tablet Take 1 tablet (5 mg total) by mouth daily with breakfast. 90 tablet 3   prochlorperazine (COMPAZINE) 10 MG tablet TAKE 1 TABLET(10 MG) BY MOUTH EVERY 6 HOURS AS NEEDED FOR NAUSEA OR VOMITING 30 tablet 0   rosuvastatin (CRESTOR) 20 MG tablet Take 20 mg by mouth daily.     traMADol (ULTRAM) 50 MG tablet Take 1 tablet (50 mg total) by mouth every 6 (six) hours as needed. 30 tablet 1   triamcinolone ointment (KENALOG) 0.5 % Apply 1 Application topically 2 (two) times daily. 45 g 1   No current facility-administered medications for this visit.   Facility-Administered Medications Ordered in Other Visits  Medication Dose Route Frequency Provider Last Rate Last Admin   0.9 %  sodium chloride infusion   Intravenous Once PRN  [name], MD       albuterol (PROVENTIL) (2.5 MG/3ML) 0.083% nebulizer solution 2.5 mg  2.5 mg Nebulization Once PRN [name], MD        VITAL SIGNS: BP 117/61 (BP Location: Right Arm, Patient Position: Sitting)   Pulse 82   Temp 97.7 F (36.5 C) (Oral)   Resp 16   Wt 202 lb 3.2 oz (91.7 kg)   SpO2 99%   BMI 30.74 kg/m  Filed Weights   01/30/22 1239  Weight: 202 lb 3.2 oz (91.7 kg)    Estimated body mass index is 30.74 kg/m as calculated from the following:   Height as of 01/24/22: _0  (1.727 m).   Weight as of this encounter: 202 lb 3.2 oz (91.7 kg).  LABS: CBC:    Component Value Date/Time   WBC 5.0 01/24/2022 0938   HGB 12.0 (L) 01/24/2022 0938   HCT 34.9 (L) 01/24/2022 8099  PLT 190 01/24/2022 0938   MCV 92.6 01/24/2022 0938   NEUTROABS 3.9 01/24/2022 0938   LYMPHSABS 0.5 (L) 01/24/2022 0938   MONOABS 0.5 01/24/2022 0938   EOSABS 0.1 01/24/2022 0938   BASOSABS 0.0 01/24/2022 0938   Comprehensive Metabolic Panel:    Component Value Date/Time   NA 138 01/24/2022 0938   K 4.3 01/24/2022 0938   CL 106 01/24/2022 0938   CO2 26 01/24/2022 0938   BUN 17 01/24/2022 0938   CREATININE 0.71 01/24/2022 0938   GLUCOSE 132 (H) 01/24/2022 0938   CALCIUM 8.9 01/24/2022 0938   AST 14 (L) 01/24/2022 0938   ALT 21 01/24/2022 0938   ALKPHOS 128 (H) 01/24/2022 0938   BILITOT 0.4 01/24/2022 0938   PROT 6.4 (L) 01/24/2022 0938   ALBUMIN 3.9 01/24/2022 0938    RADIOGRAPHIC STUDIES: NM PLUVICTO ADMINISTRATION  Result Date: 01/09/2022 CLINICAL DATA:  Metastatic prostate carcinoma. Patient status post chemotherapy. Progression post chemotherapy. Multiple foci of radiotracer avid metastatic prostate cancer on PSMA PET scan are involving the skeleton. PSA elevated. EXAM: NUCLEAR MEDICINE PLUVICTO INJECTION TECHNIQUE: Infusion: The nuclear medicine technologist and I personally verified the dose activity to be delivered as specified in the written directive, and  verified the patient identification via 2 separate methods. Initial flush of the intravenous catheter was performed was sterile saline. The dose syringe was connected to the catheter and the Lu-177 Pluvicto administered over a 1 to 10 min infusion. Single 10 cc lushes with normal saline follow the dose. No complications were noted. The entire IV tubing, venocatheter, stopcock and syringes was removed in total, placed in a disposal bag and sent for assay of the residual activity, which will be reported at a later time in our EMR by the physics staff. Pressure was applied to the venipuncture site, and a compression bandage placed. Patient monitored for 1 hour following infusion. Radiation Safety personnel were present to perform the discharge survey, as detailed on their documentation. After a short period of observation, the patient had his IV removed. RADIOPHARMACEUTICALS:  204 microcuries Lu-177 PLUVICTO FINDINGS: Current Infusion: 1 Planned Infusions: 6 Patient presented to nuclear medicine for treatment. The patient's most recent blood counts were reviewed and remains a good candidate to proceed with Lu-177 Pluvicto. Creatinine, CBC and bilirubin normal. PSA elevated at 80. The patient was situated in an infusion suite with a contact barrier placed under the arm. Intravenous access was established, using sterile technique, and a normal saline infusion from a syringe was started. Micro-dosimetry: The prescribed radiation activity was assayed and confirmed to be within specified tolerance. IMPRESSION: Current Infusion: 1 Planned Infusions: 6 The patient tolerated the infusion well. The patient will return in 6 weeks for ongoing care. Electronically Signed   By: Suzy Bouchard M.D.   On: 01/09/2022 15:38    PERFORMANCE STATUS (ECOG) : 1 - Symptomatic but completely ambulatory  Review of Systems  Constitutional:  Positive for appetite change and fatigue.  Musculoskeletal:  Positive for arthralgias.  Unless  otherwise noted, a complete review of systems is negative.  Physical Exam General: NAD Cardiovascular: regular rate and rhythm Pulmonary: clear ant fields Abdomen: soft, nontender, + bowel sounds Extremities: no edema, no joint deformities Skin: no rashes Neurological:AAO x3, mood appropriate   IMPRESSION: This is my initial visit with Mr. Orton. He presents to clinic today with his daughter. Ambulatory without assistive devices. No acute distress. Alert and able to engage appropriately in discussions.   I introduced myself, Psychologist, prison and probation services,  and Palliative's role in collaboration with the oncology team. Concept of Palliative Care was introduced as specialized medical care for people and their families living with serious illness.  It focuses on providing relief from the symptoms and stress of a serious illness.  The goal is to improve quality of life for both the patient and the family. Values and goals of care important to patient and family were attempted to be elicited.   Mr. Fonda lives at home alone. He has 2 children. Originally from [place]. He is retired from [employer].   Able to perform all ADLs independently.   Mr. Portilla states he sleeps well. Endorses occasional night sweats. 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 Denies difficulty walking. Will continue to monitor.   Is taking Miralax for constipation.   Ainsley complains of ongoing nausea, belching, which also increases his nausea. He has zofran and compazine on hand. We discussed use of Protonix to help decrease amount of stomach acid and reglan which may also assist with ongoing nausea. Education provided on use of medications and potential side effects. He and daughter verbalized understanding.   Mr. Geister states he does have occasional back pain. He has oxycodone 43m available. He does not require daily however when he does have pain it decreases allowing him to return to normal activity.   I discussed the  importance of continued conversation with family and their medical providers regarding overall plan of care and treatment options, ensuring decisions are within the context of the patients values and GOCs.  PLAN: Established therapeutic relationship. Education provided on palliative's role in collaboration with their Oncology/Radiation team. Reglan 10 mg AC/HS  Protonix 40 mg daily  Continue Zofran and Compazine as needed Miralax daily  We will continue to monitor symptoms and support as needed.  I will plan to see patient back in 2-4 weeks in collaboration to other oncology appointments. Will follow-up by phone in 1 week.    Patient expressed understanding and was in agreement with this plan. He also understands that He can call the clinic at any time with any questions, concerns, or complaints.   Thank you for your referral and allowing Palliative to assist in Mr. JForest RedwineGagnon's care.   Number and complexity of problems addressed: HIGH - 1 or more chronic illnesses with SEVERE exacerbation, progression, or side effects of treatment - advanced cancer, pain. Any controlled substances utilized were prescribed in the context of palliative care.  Time Total: 50 min   Visit consisted of counseling and education dealing with the complex and emotionally intense issues of symptom management and palliative care in the setting of serious and potentially life-threatening illness.Greater than 50%  of this time was spent counseling and coordinating care related to the above assessment and plan.  Signed by: NAlda Lea AGPCNP-BC Palliative Medicine Team/[place]

## 2022-02-05 ENCOUNTER — Inpatient Hospital Stay (HOSPITAL_BASED_OUTPATIENT_CLINIC_OR_DEPARTMENT_OTHER): Payer: PPO | Admitting: Nurse Practitioner

## 2022-02-05 ENCOUNTER — Encounter: Payer: Self-pay | Admitting: Nurse Practitioner

## 2022-02-05 DIAGNOSIS — R110 Nausea: Secondary | ICD-10-CM

## 2022-02-05 DIAGNOSIS — Z515 Encounter for palliative care: Secondary | ICD-10-CM

## 2022-02-05 DIAGNOSIS — R530 Neoplastic (malignant) related fatigue: Secondary | ICD-10-CM

## 2022-02-05 NOTE — Progress Notes (Signed)
[place]  [phone] [phone]) [phone]   Name: Stephen Collins Date: 02/05/2022 MRN: [id number]  DOB: [birth year]  Patient Care Team: [name], MD as PCP - General (Internal Medicine) Katheren Puller, RN as Oncology Nurse Navigator   I connected with Stephen Collins on 02/05/22 at 11:00 AM EST by phone and verified that I am speaking with the correct person using two identifiers.   I discussed the limitations, risks, security and privacy concerns of performing an evaluation and management service by telemedicine and the availability of in-person appointments. I also discussed with the patient that there may be a patient responsible charge related to this service. The patient expressed understanding and agreed to proceed.   Other persons participating in the visit and their role in the encounter: [place], RN    Patient's location: Home   Provider's location: [place]   Chief Complaint: Symptom Management    INTERVAL HISTORY: Stephen Collins is a 76 y.o. male with oncologic medical history including advanced prostate cancer with bone involvement (2022) s/p chemotherapy which he completed 04/2021, radiation to sacral region/L5 (06/2021), right hip and femur (11/2021).  Palliative ask to see for symptom management and goals of care.   SOCIAL HISTORY:     reports that he has been smoking cigarettes. He has a 24.00 pack-year smoking history. He has never used smokeless tobacco. He reports current alcohol use of about 12.0 standard drinks of alcohol per week. He reports that he does not use drugs.  ADVANCE DIRECTIVES:    CODE STATUS:   PAST MEDICAL HISTORY: Past Medical History:  Diagnosis Date   Bilateral renal cysts    Bladder stones    Depression    Family history of colon cancer 11/22/2021   Frequency of urination    History of kidney stones    Hyperlipidemia    Hypertension    OSA on CPAP    Wears glasses      ALLERGIES:  has No Known Allergies.  MEDICATIONS:  Current Outpatient Medications  Medication Sig Dispense Refill   abiraterone acetate (ZYTIGA) 250 MG tablet Take 4 tablets (1,000 mg total) by mouth daily. Take on an empty stomach 1 hour before or 2 hours after a meal 120 tablet 0   buPROPion (WELLBUTRIN XL) 150 MG 24 hr tablet Take 150 mg by mouth daily.     CALCIUM PO Take 1,200 mg by mouth daily.     cetirizine (ZYRTEC) 10 MG tablet Take 10 mg by mouth daily as needed for allergies.     cholecalciferol (VITAMIN D3) 25 MCG (1000 UNIT) tablet Take 1,000 Units by mouth daily.     guaiFENesin (MUCINEX) 600 MG 12 hr tablet Take 1 tablet (600 mg total) by mouth 2 (two) times daily. 28 tablet 1   irbesartan-hydrochlorothiazide (AVALIDE) 300-12.5 MG tablet Take 1 tablet by mouth every morning.      lidocaine-prilocaine (EMLA) cream Apply 1 application topically as needed. 30 g 0   metoCLOPramide (REGLAN) 10 MG tablet Take 1 tablet (10 mg total) by mouth 4 (four) times daily -  before meals and at bedtime. 60 tablet 2   Multiple Vitamins-Minerals (ADULT GUMMY PO) Take 2 capsules by mouth daily.     naproxen sodium (ALEVE) 220 MG tablet Take 220 mg by mouth daily as needed (pain).     ondansetron (ZOFRAN) 8 MG tablet Take 0.5 tablets (4 mg total) by mouth every 8 (eight) hours as  needed for nausea or vomiting. 30 tablet 3   oxyCODONE 10 MG TABS Take 1 tablet (10 mg total) by mouth every 4 (four) hours as needed for severe pain. 90 tablet 0   pantoprazole (PROTONIX) 40 MG tablet Take 1 tablet (40 mg total) by mouth daily. 30 tablet 2   predniSONE (DELTASONE) 5 MG tablet Take 1 tablet (5 mg total) by mouth daily with breakfast. 90 tablet 3   prochlorperazine (COMPAZINE) 10 MG tablet TAKE 1 TABLET(10 MG) BY MOUTH EVERY 6 HOURS AS NEEDED FOR NAUSEA OR VOMITING 30 tablet 0   rosuvastatin (CRESTOR) 20 MG tablet Take 20 mg by mouth daily.     traMADol (ULTRAM) 50 MG tablet Take 1 tablet (50 mg total)  by mouth every 6 (six) hours as needed. 30 tablet 1   triamcinolone ointment (KENALOG) 0.5 % Apply 1 Application topically 2 (two) times daily. 45 g 1   No current facility-administered medications for this visit.   Facility-Administered Medications Ordered in Other Visits  Medication Dose Route Frequency Provider Last Rate Last Admin   0.9 %  sodium chloride infusion   Intravenous Once PRN [name], MD       albuterol (PROVENTIL) (2.5 MG/3ML) 0.083% nebulizer solution 2.5 mg  2.5 mg Nebulization Once PRN [name], MD        VITAL SIGNS: There were no vitals taken for this visit. There were no vitals filed for this visit.  Estimated body mass index is 30.74 kg/m as calculated from the following:   Height as of 01/24/22: '5\' 8"'$  (1.727 m).   Weight as of 01/30/22: 202 lb 3.2 oz (91.7 kg).   PERFORMANCE STATUS (ECOG) : 1 - Symptomatic but completely ambulatory   IMPRESSION:  I connected with Stephen Collins by phone for symptom management follow-up. No acute distress identified. He reports some improvement since starting on reglan and increasing protonix to twice daily. Unfortunately Christmas eve he did have some vomiting but overall feels well.   We discussed continued use of medications with hopes of ongoing stability/improvement in his nausea.   Pain is controlled with as needed oxycodone. Does not require daily.    PLAN: Reglan 10 mg AC/HS  Protonix 40 mg daily  Continue Zofran and Compazine as needed Miralax daily  We will continue to monitor symptoms and support as needed.  I will plan to see patient back in 2-4 weeks in collaboration to other oncology appointments.    Patient expressed understanding and was in agreement with this plan. He also understands that He can call the clinic at any time with any questions, concerns, or complaints.   Time Total: 35 min   Visit consisted of counseling and education dealing with the complex and emotionally intense issues  of symptom management and palliative care in the setting of serious and potentially life-threatening illness.Greater than 50%  of this time was spent counseling and coordinating care related to the above assessment and plan.  Alda Lea, AGPCNP-BC  Palliative Medicine Team/[facility]

## 2022-02-13 ENCOUNTER — Other Ambulatory Visit: Payer: Self-pay

## 2022-02-13 ENCOUNTER — Inpatient Hospital Stay: Payer: PPO | Attending: Oncology

## 2022-02-13 DIAGNOSIS — Z1501 Genetic susceptibility to malignant neoplasm of breast: Secondary | ICD-10-CM | POA: Diagnosis not present

## 2022-02-13 DIAGNOSIS — Z79891 Long term (current) use of opiate analgesic: Secondary | ICD-10-CM | POA: Insufficient documentation

## 2022-02-13 DIAGNOSIS — C7951 Secondary malignant neoplasm of bone: Secondary | ICD-10-CM | POA: Insufficient documentation

## 2022-02-13 DIAGNOSIS — F17210 Nicotine dependence, cigarettes, uncomplicated: Secondary | ICD-10-CM | POA: Insufficient documentation

## 2022-02-13 DIAGNOSIS — G893 Neoplasm related pain (acute) (chronic): Secondary | ICD-10-CM | POA: Diagnosis not present

## 2022-02-13 DIAGNOSIS — Z79899 Other long term (current) drug therapy: Secondary | ICD-10-CM | POA: Insufficient documentation

## 2022-02-13 DIAGNOSIS — Z95828 Presence of other vascular implants and grafts: Secondary | ICD-10-CM

## 2022-02-13 DIAGNOSIS — I10 Essential (primary) hypertension: Secondary | ICD-10-CM | POA: Insufficient documentation

## 2022-02-13 DIAGNOSIS — C61 Malignant neoplasm of prostate: Secondary | ICD-10-CM | POA: Diagnosis not present

## 2022-02-13 DIAGNOSIS — Z7952 Long term (current) use of systemic steroids: Secondary | ICD-10-CM | POA: Insufficient documentation

## 2022-02-13 DIAGNOSIS — R112 Nausea with vomiting, unspecified: Secondary | ICD-10-CM | POA: Diagnosis not present

## 2022-02-13 DIAGNOSIS — Z9221 Personal history of antineoplastic chemotherapy: Secondary | ICD-10-CM | POA: Diagnosis not present

## 2022-02-13 DIAGNOSIS — Z923 Personal history of irradiation: Secondary | ICD-10-CM | POA: Insufficient documentation

## 2022-02-13 DIAGNOSIS — Z515 Encounter for palliative care: Secondary | ICD-10-CM | POA: Insufficient documentation

## 2022-02-13 DIAGNOSIS — R634 Abnormal weight loss: Secondary | ICD-10-CM | POA: Insufficient documentation

## 2022-02-13 LAB — CMP (CANCER CENTER ONLY)
ALT: 17 U/L (ref 0–44)
AST: 16 U/L (ref 15–41)
Albumin: 3.5 g/dL (ref 3.5–5.0)
Alkaline Phosphatase: 311 U/L — ABNORMAL HIGH (ref 38–126)
Anion gap: 8 (ref 5–15)
BUN: 20 mg/dL (ref 8–23)
CO2: 23 mmol/L (ref 22–32)
Calcium: 7.9 mg/dL — ABNORMAL LOW (ref 8.9–10.3)
Chloride: 105 mmol/L (ref 98–111)
Creatinine: 0.73 mg/dL (ref 0.61–1.24)
GFR, Estimated: >60 mL/min (ref >60)
Glucose, Bld: 111 mg/dL — ABNORMAL HIGH (ref 70–99)
Potassium: 4.1 mmol/L (ref 3.5–5.1)
Sodium: 136 mmol/L (ref 135–145)
Total Bilirubin: 0.6 mg/dL (ref 0.3–1.2)
Total Protein: 7.1 g/dL (ref 6.5–8.1)

## 2022-02-13 LAB — CBC WITH DIFFERENTIAL (CANCER CENTER ONLY)
Abs Immature Granulocytes: 0.21 10*3/uL — ABNORMAL HIGH (ref 0.00–0.07)
Basophils Absolute: 0 10*3/uL (ref 0.0–0.1)
Basophils Relative: 1 %
Eosinophils Absolute: 0 10*3/uL (ref 0.0–0.5)
Eosinophils Relative: 1 %
HCT: 31.3 % — ABNORMAL LOW (ref 39.0–52.0)
Hemoglobin: 10.7 g/dL — ABNORMAL LOW (ref 13.0–17.0)
Immature Granulocytes: 3 %
Lymphocytes Relative: 8 %
Lymphs Abs: 0.5 10*3/uL — ABNORMAL LOW (ref 0.7–4.0)
MCH: 31.1 pg (ref 26.0–34.0)
MCHC: 34.2 g/dL (ref 30.0–36.0)
MCV: 91 fL (ref 80.0–100.0)
Monocytes Absolute: 0.6 10*3/uL (ref 0.1–1.0)
Monocytes Relative: 10 %
Neutro Abs: 5.2 10*3/uL (ref 1.7–7.7)
Neutrophils Relative %: 77 %
Platelet Count: 165 10*3/uL (ref 150–400)
RBC: 3.44 MIL/uL — ABNORMAL LOW (ref 4.22–5.81)
RDW: 14.9 % (ref 11.5–15.5)
WBC Count: 6.7 10*3/uL (ref 4.0–10.5)
nRBC: 0 % (ref 0.0–0.2)

## 2022-02-13 MED ORDER — HEPARIN SOD (PORK) LOCK FLUSH 100 UNIT/ML IV SOLN
500.0000 [IU] | Freq: Once | INTRAVENOUS | Status: AC
  Administered 2022-02-13: 500 [IU]

## 2022-02-13 MED ORDER — SODIUM CHLORIDE 0.9% FLUSH
10.0000 mL | Freq: Once | INTRAVENOUS | Status: AC
  Administered 2022-02-13: 10 mL

## 2022-02-15 ENCOUNTER — Inpatient Hospital Stay (HOSPITAL_BASED_OUTPATIENT_CLINIC_OR_DEPARTMENT_OTHER): Payer: PPO | Admitting: Oncology

## 2022-02-15 ENCOUNTER — Inpatient Hospital Stay: Payer: PPO | Admitting: Nurse Practitioner

## 2022-02-15 ENCOUNTER — Encounter: Payer: Self-pay | Admitting: Nurse Practitioner

## 2022-02-15 ENCOUNTER — Inpatient Hospital Stay (HOSPITAL_BASED_OUTPATIENT_CLINIC_OR_DEPARTMENT_OTHER): Payer: PPO | Admitting: Nurse Practitioner

## 2022-02-15 ENCOUNTER — Encounter: Payer: Self-pay | Admitting: Oncology

## 2022-02-15 VITALS — BP 128/62 | HR 91 | Temp 98.6°F | Resp 16

## 2022-02-15 VITALS — BP 126/68 | HR 103 | Temp 97.7°F | Resp 17 | Wt 199.4 lb

## 2022-02-15 DIAGNOSIS — R110 Nausea: Secondary | ICD-10-CM

## 2022-02-15 DIAGNOSIS — R630 Anorexia: Secondary | ICD-10-CM

## 2022-02-15 DIAGNOSIS — C7951 Secondary malignant neoplasm of bone: Secondary | ICD-10-CM

## 2022-02-15 DIAGNOSIS — Z515 Encounter for palliative care: Secondary | ICD-10-CM | POA: Diagnosis not present

## 2022-02-15 DIAGNOSIS — G893 Neoplasm related pain (acute) (chronic): Secondary | ICD-10-CM

## 2022-02-15 DIAGNOSIS — R530 Neoplastic (malignant) related fatigue: Secondary | ICD-10-CM | POA: Diagnosis not present

## 2022-02-15 DIAGNOSIS — C61 Malignant neoplasm of prostate: Secondary | ICD-10-CM

## 2022-02-15 DIAGNOSIS — R634 Abnormal weight loss: Secondary | ICD-10-CM | POA: Diagnosis not present

## 2022-02-15 DIAGNOSIS — Z7189 Other specified counseling: Secondary | ICD-10-CM

## 2022-02-15 LAB — PROSTATE-SPECIFIC AG, SERUM (LABCORP): Prostate Specific Ag, Serum: 653 ng/mL — ABNORMAL HIGH (ref 0.0–4.0)

## 2022-02-15 MED ORDER — FAMOTIDINE IN NACL 20-0.9 MG/50ML-% IV SOLN
20.0000 mg | Freq: Once | INTRAVENOUS | Status: AC
  Administered 2022-02-15: 20 mg via INTRAVENOUS
  Filled 2022-02-15: qty 50

## 2022-02-15 MED ORDER — SODIUM CHLORIDE 0.9 % IV SOLN: 8.0000 mg | Freq: Once | INTRAVENOUS | Status: DC

## 2022-02-15 MED ORDER — HEPARIN SOD (PORK) LOCK FLUSH 100 UNIT/ML IV SOLN
500.0000 [IU] | Freq: Once | INTRAVENOUS | Status: AC
  Administered 2022-02-15: 500 [IU] via INTRAVENOUS

## 2022-02-15 MED ORDER — SODIUM CHLORIDE 0.9 % IV SOLN: Freq: Once | INTRAVENOUS | Status: AC

## 2022-02-15 MED ORDER — ONDANSETRON HCL 4 MG/2ML IJ SOLN
8.0000 mg | Freq: Once | INTRAMUSCULAR | Status: AC
  Administered 2022-02-15: 8 mg via INTRAVENOUS
  Filled 2022-02-15: qty 4

## 2022-02-15 MED ORDER — OXYCODONE HCL 10 MG PO TABS: 10.0000 mg | ORAL_TABLET | ORAL | 0 refills | Status: AC | PRN

## 2022-02-15 MED ORDER — SCOPOLAMINE 1 MG/3DAYS TD PT72: 1.0000 | MEDICATED_PATCH | TRANSDERMAL | 12 refills | Status: AC

## 2022-02-15 MED ORDER — SODIUM CHLORIDE 0.9% FLUSH
10.0000 mL | Freq: Once | INTRAVENOUS | Status: AC
  Administered 2022-02-15: 10 mL via INTRAVENOUS

## 2022-02-15 NOTE — Progress Notes (Signed)
Pt came in to the office for symptom f/u with Lexine Baton, NP, pt reported not eating/drinking much at home and increased N/V, IVF and antiemetics orders per Lexine Baton, NP. Pt also given Pedialyte samples and a referral to nutrition was made. Pt was d/c in stable condition ambulatory to lobby with no complaints.

## 2022-02-15 NOTE — Progress Notes (Signed)
Hematology and Oncology Follow Up Visit  Stephen Collins [id number] Mar 25, 1945 77 y.o. 02/15/2022 11:32 AM Stephen Collins, MDAvva, [name], MD   Principle Diagnosis: 77 year old with man with castration-resistant advanced prostate cancer with disease to the bone diagnosed in 2022.  He presented with Gleason score 9 and a PSA 57 and BRCA1 mutation on [employer] analysis in October 2023.    Prior Therapy: He was on active surveillance and did not receive any additional treatment.  His PSA increased to 83.2 in September 2022 and a PET scan showed diffuse involvement.  Repeat prostate biopsy on November 27, 2020 showed a Gleason score 4+5 = 9.  Eligard 22.5 mg every 3 months started on February 02, 2021.  Taxotere chemotherapy 75 mg per metered squared started on January 09, 2021.  The dose was changed to 60 mg per metered square starting with cycle 4 of therapy.  He completed 6 cycles of therapy in March 2023.  He is status post radiation therapy to the L5 and sacral region.  Completed 30 Gray in 10 fractions in May 2023.  He completed 30 Gray in 10 fractions of radiation to the right hip and proximal femur concluded in October 2023.  Current therapy:  Eligard 30 mg every 4 months with the last injection given on May 18, 2021.  Last injection was given on January 18, 2022.   Xgeva 120 mg every 2 months.  This was given on January 18, 2022.  Pluvicto infusion started on January 09, 2022.   Interim History: Mr. [name] is today for a repeat follow-up.  Since the last visit, he reports a few complaints and further decline in his performance status.  He has reported recurrent nausea and vomiting although improved with Reglan.  He also has reported reasonable control with his pain medication using oxycodone 10 mg close to 3 times a day.  He is still ambulatory and attends to activities of daily living but his performance status and quality of life continues to decline.  He denies any  recent falls or syncope.  He continues to live independently and still able to drive.    Medications: Reviewed without changes. Current Outpatient Medications  Medication Sig Dispense Refill   abiraterone acetate (ZYTIGA) 250 MG tablet Take 4 tablets (1,000 mg total) by mouth daily. Take on an empty stomach 1 hour before or 2 hours after a meal 120 tablet 0   buPROPion (WELLBUTRIN XL) 150 MG 24 hr tablet Take 150 mg by mouth daily.     CALCIUM PO Take 1,200 mg by mouth daily.     cetirizine (ZYRTEC) 10 MG tablet Take 10 mg by mouth daily as needed for allergies.     cholecalciferol (VITAMIN D3) 25 MCG (1000 UNIT) tablet Take 1,000 Units by mouth daily.     guaiFENesin (MUCINEX) 600 MG 12 hr tablet Take 1 tablet (600 mg total) by mouth 2 (two) times daily. 28 tablet 1   irbesartan-hydrochlorothiazide (AVALIDE) 300-12.5 MG tablet Take 1 tablet by mouth every morning.      lidocaine-prilocaine (EMLA) cream Apply 1 application topically as needed. 30 g 0   metoCLOPramide (REGLAN) 10 MG tablet Take 1 tablet (10 mg total) by mouth 4 (four) times daily -  before meals and at bedtime. 60 tablet 2   Multiple Vitamins-Minerals (ADULT GUMMY PO) Take 2 capsules by mouth daily.     naproxen sodium (ALEVE) 220 MG tablet Take 220 mg by mouth daily as needed (pain).     ondansetron (  ZOFRAN) 8 MG tablet Take 0.5 tablets (4 mg total) by mouth every 8 (eight) hours as needed for nausea or vomiting. 30 tablet 3   oxyCODONE 10 MG TABS Take 1 tablet (10 mg total) by mouth every 4 (four) hours as needed for severe pain. 90 tablet 0   pantoprazole (PROTONIX) 40 MG tablet Take 1 tablet (40 mg total) by mouth daily. 30 tablet 2   predniSONE (DELTASONE) 5 MG tablet Take 1 tablet (5 mg total) by mouth daily with breakfast. 90 tablet 3   prochlorperazine (COMPAZINE) 10 MG tablet TAKE 1 TABLET(10 MG) BY MOUTH EVERY 6 HOURS AS NEEDED FOR NAUSEA OR VOMITING 30 tablet 0   rosuvastatin (CRESTOR) 20 MG tablet Take 20 mg by  mouth daily.     traMADol (ULTRAM) 50 MG tablet Take 1 tablet (50 mg total) by mouth every 6 (six) hours as needed. 30 tablet 1   triamcinolone ointment (KENALOG) 0.5 % Apply 1 Application topically 2 (two) times daily. 45 g 1   No current facility-administered medications for this visit.   Facility-Administered Medications Ordered in Other Visits  Medication Dose Route Frequency Provider Last Rate Last Admin   0.9 %  sodium chloride infusion   Intravenous Once PRN [name], MD       albuterol (PROVENTIL) (2.5 MG/3ML) 0.083% nebulizer solution 2.5 mg  2.5 mg Nebulization Once PRN [name], MD           Physical Exam:             ECOG: 2    General appearance: Alert, awake without any distress. Head: Atraumatic without abnormalities Oropharynx: Without any thrush or ulcers. Eyes: No scleral icterus. Lymph nodes: No lymphadenopathy noted in the cervical, supraclavicular, or axillary nodes Heart:regular rate and rhythm, without any murmurs or gallops.   Lung: Clear to auscultation without any rhonchi, wheezes or dullness to percussion. Abdomin: Soft, nontender without any shifting dullness or ascites. Musculoskeletal: No clubbing or cyanosis. Neurological: No motor or sensory deficits. Skin: No rashes or lesions.              Lab Results: Lab Results  Component Value Date   WBC 6.7 02/13/2022   HGB 10.7 (L) 02/13/2022   HCT 31.3 (L) 02/13/2022   MCV 91.0 02/13/2022   PLT 165 02/13/2022     Chemistry      Component Value Date/Time   NA 136 02/13/2022 1354   K 4.1 02/13/2022 1354   CL 105 02/13/2022 1354   CO2 23 02/13/2022 1354   BUN 20 02/13/2022 1354   CREATININE 0.73 02/13/2022 1354      Component Value Date/Time   CALCIUM 7.9 (L) 02/13/2022 1354   ALKPHOS 311 (H) 02/13/2022 1354   AST 16 02/13/2022 1354   ALT 17 02/13/2022 1354   BILITOT 0.6 02/13/2022 1354        Impression and Plan:  77 year old with:  1.   Advanced prostate cancer with disease to the bone and lymphadenopathy diagnosed in 2022.  He has castration-resistant disease at this time.   He is currently on Pluvicto and received the first treatment in November although his PSA continues to rise rapidly.  His PSA on January 3 was 653.  Treatment options at this time were discussed including continuing Pluvicto, use of PARP inhibitor such as [place] or Jevtana chemotherapy.  It is unclear whether any of these modalities will curb the rapid progression of his disease ideally, systemic chemotherapy may be  his best option but he had less than adequate response to Taxotere.  Complication associated with [place]za include nausea, vomiting, myelosuppression and diarrhea were reiterated.  After discussion today, we opted to discontinue Pluvicto.  He is at not sure if he wants to proceed with any additional treatment at this time.  He would like to think about it and let me know in the near future.  Continue supportive care only is an option but he understands that the disease will progress rapidly and likely would require hospice enrollment.  He also understands the window to treat his cancer could be closing.    2.  IV access: Port-A-Cath continue to be flushed periodically.   3.  Bone pain: Related to advanced malignancy.  He is currently on oxycodone which is manageable.   4.  Androgen deprivation therapy: He is currently on Eligard and received and last on December 8.  This will be repeated in 4 months.  5.  Goals of care: Therapy remains palliative and his disease is fairly aggressive.  His performance status remains adequate and aggressive measures are warranted.  His prognosis however is poor given the rapid progression of his cancer and declining performance status.  He understands he has limited life expectancy even with treatment and transitioning to hospice may be required in the future despite his reasonable performance status currently.    6.  Bone directed therapy: He is currently on Xgeva which will continue to be repeated every 2 months.  He received it last on December 8.  His calcium is low currently we have been withheld unless it is improving.  7.  Genetic considerations: This was discussed on multiple occasions given his BRCA mutation and he has completed genetic counseling.   8.  Follow-up: Will be in the next 2 to 3 weeks pending his decision on how to proceed with treatment.   30  minutes were dedicated to this encounter.  The time was spent on updating disease status, treatment choices, prognosis, complications related to his cancer and cancer therapy as well as overall life expectancy.  Zola Button, MD 1/5/202411:32 AM

## 2022-02-15 NOTE — Progress Notes (Signed)
[place]  Telephone:(336) [phone] [phone]) [phone]   Name: Stephen Collins Date: 02/15/2022 MRN: [id number]  DOB: Feb 13, 1945  Patient Care Team: Stephen Solian, MD as PCP - General (Internal Medicine) Stephen Puller, RN as Oncology Nurse Navigator      INTERVAL HISTORY: Stephen Collins is a 77 y.o. male with oncologic medical history including advanced prostate cancer with bone involvement (2022) s/p chemotherapy which he completed 04/2021, radiation to sacral region/L5 (06/2021), right hip and femur (11/2021).  Palliative ask to see for symptom management and goals of care.   SOCIAL HISTORY:     reports that he has been smoking cigarettes. He has a 24.00 pack-year smoking history. He has never used smokeless tobacco. He reports current alcohol use of about 12.0 standard drinks of alcohol per week. He reports that he does not use drugs.  ADVANCE DIRECTIVES:    CODE STATUS:   PAST MEDICAL HISTORY: Past Medical History:  Diagnosis Date   Bilateral renal cysts    Bladder stones    Depression    Family history of colon cancer 11/22/2021   Frequency of urination    History of kidney stones    Hyperlipidemia    Hypertension    OSA on CPAP    Wears glasses     ALLERGIES:  has No Known Allergies.  MEDICATIONS:  Current Outpatient Medications  Medication Sig Dispense Refill   abiraterone acetate (ZYTIGA) 250 MG tablet Take 4 tablets (1,000 mg total) by mouth daily. Take on an empty stomach 1 hour before or 2 hours after a meal 120 tablet 0   buPROPion (WELLBUTRIN XL) 150 MG 24 hr tablet Take 150 mg by mouth daily.     CALCIUM PO Take 1,200 mg by mouth daily.     cetirizine (ZYRTEC) 10 MG tablet Take 10 mg by mouth daily as needed for allergies.     cholecalciferol (VITAMIN D3) 25 MCG (1000 UNIT) tablet Take 1,000 Units by mouth daily.     guaiFENesin (MUCINEX) 600 MG 12 hr tablet Take 1 tablet (600 mg total) by mouth 2 (two) times  daily. 28 tablet 1   irbesartan-hydrochlorothiazide (AVALIDE) 300-12.5 MG tablet Take 1 tablet by mouth every morning.      lidocaine-prilocaine (EMLA) cream Apply 1 application topically as needed. 30 g 0   metoCLOPramide (REGLAN) 10 MG tablet Take 1 tablet (10 mg total) by mouth 4 (four) times daily -  before meals and at bedtime. 60 tablet 2   Multiple Vitamins-Minerals (ADULT GUMMY PO) Take 2 capsules by mouth daily.     naproxen sodium (ALEVE) 220 MG tablet Take 220 mg by mouth daily as needed (pain).     ondansetron (ZOFRAN) 8 MG tablet Take 0.5 tablets (4 mg total) by mouth every 8 (eight) hours as needed for nausea or vomiting. 30 tablet 3   oxyCODONE 10 MG TABS Take 1 tablet (10 mg total) by mouth every 4 (four) hours as needed for severe pain. 90 tablet 0   pantoprazole (PROTONIX) 40 MG tablet Take 1 tablet (40 mg total) by mouth daily. 30 tablet 2   predniSONE (DELTASONE) 5 MG tablet Take 1 tablet (5 mg total) by mouth daily with breakfast. 90 tablet 3   prochlorperazine (COMPAZINE) 10 MG tablet TAKE 1 TABLET(10 MG) BY MOUTH EVERY 6 HOURS AS NEEDED FOR NAUSEA OR VOMITING 30 tablet 0   rosuvastatin (CRESTOR) 20 MG tablet Take 20 mg by mouth daily.  traMADol (ULTRAM) 50 MG tablet Take 1 tablet (50 mg total) by mouth every 6 (six) hours as needed. 30 tablet 1   triamcinolone ointment (KENALOG) 0.5 % Apply 1 Application topically 2 (two) times daily. 45 g 1   No current facility-administered medications for this visit.   Facility-Administered Medications Ordered in Other Visits  Medication Dose Route Frequency Provider Last Rate Last Admin   0.9 %  sodium chloride infusion   Intravenous Once PRN Stephen Portela, MD       albuterol (PROVENTIL) (2.5 MG/3ML) 0.083% nebulizer solution 2.5 mg  2.5 mg Nebulization Once PRN Stephen Portela, MD        VITAL SIGNS: There were no vitals taken for this visit. There were no vitals filed for this visit.  Estimated body mass index is 30.74  kg/m as calculated from the following:   Height as of 01/24/22: '5\' 8"'$  (1.727 m).   Weight as of 01/30/22: 202 lb 3.2 oz (91.7 kg).   PERFORMANCE STATUS (ECOG) : 1 - Symptomatic but completely ambulatory   IMPRESSION: [name] presented to clinic today for symptom management follow-up. Continues to endorse ongoing fatigue and occasional nausea. He does notice some improvement since starting Reglan however some days are worst than others. He is trying to remain as active as possible however his quality of life is just not improving. He was seen by Dr. Alen Collins today. [name] shares they have decided to discontinue oral chemotherapy for now allowing him some time to hopefully feel better and consider next options. He is very much comfortable with this plan.   Pain is controlled with as needed oxycodone. He is taking 2-3 times daily.   [name] shares his appetite is minimal with no desire to eat more specifically on days of severe nausea. His current weight is 199lbs down from 204 lbs on 12/14, 215lbs on 10/20. We discussed ways to not only improve nausea but appetite. Education provided on focusing on small frequent meals, eating foods and snacking when he has the desire.   Patient was given samples of pedialyte for additional support. We discussed at length his medications. He is tolerating reglan, zofran, and compazine. Education provided on use of scopolamine patch for additional support and potential side effects. Stephen Collins reports he has not eaten or drank much today. We will administer 1L IVF and IV anti-emetics while in the clinic today for additional support. He expressed appreciation.   We will continue to closely monitor and adjust as needed. If nausea does not decrease may consider olanzapine or ativan.   We discussed [name] current illness and disease trajectory. He is realistic in his understanding. His quality of life means the most to him. He does not wish to suffer and  hopeful all symptoms can be managed allowing him every opportunity to comfortably thrive.   Education provided on hospice as requested by patient. [name] asked appropriate questions regarding what care would look like in the home and residential hospice as well as expectations in the future. All questions answered and support provided.    PLAN: Reglan 10 mg AC/HS  Protonix 40 mg daily  Continue Zofran and Compazine as needed Scopolamine patch for nausea. Could potentially consider olanzapine or ativan if symptoms does not improve.  1L IVF, IV zofran, and IV pepcid while in clinic today. Will also plan to schedule patient for one day next week with close follow-up.  Miralax daily  We will continue to monitor symptoms and  support as needed.  I will plan to see patient back in 1-2 weeks in collaboration to other oncology appointments. Patient knows to contact office sooner if needed.    Patient expressed understanding and was in agreement with this plan. He also understands that He can call the clinic at any time with any questions, concerns, or complaints.    Any controlled substances utilized were prescribed in the context of palliative care. PDMP has been reviewed.    Time Total: 55 min   Visit consisted of counseling and education dealing with the complex and emotionally intense issues of symptom management and palliative care in the setting of serious and potentially life-threatening illness.Greater than 50%  of this time was spent counseling and coordinating care related to the above assessment and plan.  Alda Lea, AGPCNP-BC  Palliative Medicine Team/[place]

## 2022-02-16 ENCOUNTER — Other Ambulatory Visit: Payer: Self-pay

## 2022-02-16 ENCOUNTER — Emergency Department (HOSPITAL_COMMUNITY)
Admission: EM | Admit: 2022-02-16 | Discharge: 2022-02-17 | Disposition: A | Discharge status: Home / Self Care | Payer: PPO | Attending: Emergency Medicine | Admitting: Emergency Medicine

## 2022-02-16 ENCOUNTER — Emergency Department (HOSPITAL_COMMUNITY): Payer: PPO

## 2022-02-16 ENCOUNTER — Encounter (HOSPITAL_COMMUNITY): Payer: Self-pay

## 2022-02-16 DIAGNOSIS — K429 Umbilical hernia without obstruction or gangrene: Secondary | ICD-10-CM | POA: Diagnosis not present

## 2022-02-16 DIAGNOSIS — N210 Calculus in bladder: Secondary | ICD-10-CM | POA: Diagnosis not present

## 2022-02-16 DIAGNOSIS — R109 Unspecified abdominal pain: Secondary | ICD-10-CM | POA: Diagnosis not present

## 2022-02-16 DIAGNOSIS — Z85830 Personal history of malignant neoplasm of bone: Secondary | ICD-10-CM | POA: Diagnosis not present

## 2022-02-16 DIAGNOSIS — R112 Nausea with vomiting, unspecified: Secondary | ICD-10-CM | POA: Diagnosis not present

## 2022-02-16 DIAGNOSIS — Z8546 Personal history of malignant neoplasm of prostate: Secondary | ICD-10-CM | POA: Diagnosis not present

## 2022-02-16 DIAGNOSIS — C61 Malignant neoplasm of prostate: Secondary | ICD-10-CM

## 2022-02-16 DIAGNOSIS — N281 Cyst of kidney, acquired: Secondary | ICD-10-CM | POA: Diagnosis not present

## 2022-02-16 DIAGNOSIS — E860 Dehydration: Secondary | ICD-10-CM | POA: Insufficient documentation

## 2022-02-16 LAB — COMPREHENSIVE METABOLIC PANEL
ALT: 15 U/L (ref 0–44)
AST: 14 U/L — ABNORMAL LOW (ref 15–41)
Albumin: 2.7 g/dL — ABNORMAL LOW (ref 3.5–5.0)
Alkaline Phosphatase: 272 U/L — ABNORMAL HIGH (ref 38–126)
Anion gap: 10 (ref 5–15)
BUN: 14 mg/dL (ref 8–23)
CO2: 22 mmol/L (ref 22–32)
Calcium: 7.8 mg/dL — ABNORMAL LOW (ref 8.9–10.3)
Chloride: 103 mmol/L (ref 98–111)
Creatinine, Ser: 0.68 mg/dL (ref 0.61–1.24)
GFR, Estimated: >60 mL/min (ref >60)
Glucose, Bld: 116 mg/dL — ABNORMAL HIGH (ref 70–99)
Potassium: 3.8 mmol/L (ref 3.5–5.1)
Sodium: 135 mmol/L (ref 135–145)
Total Bilirubin: 0.5 mg/dL (ref 0.3–1.2)
Total Protein: 6.7 g/dL (ref 6.5–8.1)

## 2022-02-16 LAB — URINALYSIS, ROUTINE W REFLEX MICROSCOPIC
Bilirubin Urine: NEGATIVE
Glucose, UA: NEGATIVE mg/dL
Hgb urine dipstick: NEGATIVE
Ketones, ur: 5 mg/dL — AB
Leukocytes,Ua: NEGATIVE
Nitrite: NEGATIVE
Protein, ur: NEGATIVE mg/dL
Specific Gravity, Urine: 1.013 (ref 1.005–1.030)
pH: 6 (ref 5.0–8.0)

## 2022-02-16 LAB — CBC
HCT: 30.4 % — ABNORMAL LOW (ref 39.0–52.0)
Hemoglobin: 9.9 g/dL — ABNORMAL LOW (ref 13.0–17.0)
MCH: 30.2 pg (ref 26.0–34.0)
MCHC: 32.6 g/dL (ref 30.0–36.0)
MCV: 92.7 fL (ref 80.0–100.0)
Platelets: 154 10*3/uL (ref 150–400)
RBC: 3.28 MIL/uL — ABNORMAL LOW (ref 4.22–5.81)
RDW: 14.6 % (ref 11.5–15.5)
WBC: 6.8 10*3/uL (ref 4.0–10.5)
nRBC: 0 % (ref 0.0–0.2)

## 2022-02-16 LAB — LIPASE, BLOOD: Lipase: 33 U/L (ref 11–51)

## 2022-02-16 MED ORDER — HYDROMORPHONE HCL 1 MG/ML IJ SOLN
0.5000 mg | Freq: Once | INTRAMUSCULAR | Status: AC
  Administered 2022-02-16: 0.5 mg via INTRAVENOUS
  Filled 2022-02-16: qty 1

## 2022-02-16 MED ORDER — ONDANSETRON HCL 4 MG/2ML IJ SOLN
4.0000 mg | Freq: Once | INTRAMUSCULAR | Status: AC
  Administered 2022-02-16: 4 mg via INTRAVENOUS
  Filled 2022-02-16: qty 2

## 2022-02-16 MED ORDER — DIAZEPAM 5 MG/ML IJ SOLN
2.5000 mg | Freq: Once | INTRAMUSCULAR | Status: AC
  Administered 2022-02-16: 2.5 mg via INTRAVENOUS
  Filled 2022-02-16: qty 2

## 2022-02-16 MED ORDER — LACTATED RINGERS IV BOLUS
1000.0000 mL | Freq: Once | INTRAVENOUS | Status: AC
  Administered 2022-02-16: 1000 mL via INTRAVENOUS

## 2022-02-16 MED ORDER — LACTATED RINGERS IV BOLUS
500.0000 mL | Freq: Once | INTRAVENOUS | Status: AC
  Administered 2022-02-16: 500 mL via INTRAVENOUS

## 2022-02-16 MED ORDER — METOCLOPRAMIDE HCL 5 MG/ML IJ SOLN
10.0000 mg | Freq: Once | INTRAMUSCULAR | Status: AC
  Administered 2022-02-16: 10 mg via INTRAVENOUS
  Filled 2022-02-16: qty 2

## 2022-02-16 MED ORDER — IOHEXOL 300 MG/ML  SOLN
100.0000 mL | Freq: Once | INTRAMUSCULAR | Status: AC | PRN
  Administered 2022-02-16: 100 mL via INTRAVENOUS

## 2022-02-16 NOTE — ED Notes (Signed)
Patient transported to CT 

## 2022-02-16 NOTE — ED Notes (Signed)
Patient states he does not want this RN to [place] him; states that he "is almost ready" to use the restroom on his own. Patient states he will utilize RN call button when ready for assistance.

## 2022-02-16 NOTE — ED Provider Notes (Incomplete Revision)
[facility] DEPT Provider Note   CSN: [id number] Arrival date & time: 02/16/22  1659     History  Chief Complaint  Patient presents with   Emesis    Stephen Collins is a 77 y.o. male.  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 Has fairly consistently been nauseated, with intermittent vomiting in past two days. Emesis not bloody or bilious. No abd distension. No constant and/or focal abd pain. Is having bms and making urine. No dysuria. No chest pain or discomfort. No sob. Does take oxycodone for bone pain. No headache.   The history is provided by the patient and medical records.  Emesis Associated symptoms: no abdominal pain, no chills, no cough, no fever, no headaches and no sore throat        Home Medications Prior to Admission medications   Medication Sig Start Date End Date Taking? Authorizing Provider  abiraterone acetate (ZYTIGA) 250 MG tablet Take 4 tablets (1,000 mg total) by mouth daily. Take on an empty stomach 1 hour before or 2 hours after a meal 10/31/21   [name], MD  buPROPion (WELLBUTRIN XL) 150 MG 24 hr tablet Take 150 mg by mouth daily.    [provider]  CALCIUM PO Take 1,200 mg by mouth daily.    [provider]  cetirizine (ZYRTEC) 10 MG tablet Take 10 mg by mouth daily as needed for allergies.    [provider]  cholecalciferol (VITAMIN D3) 25 MCG (1000 UNIT) tablet Take 1,000 Units by mouth daily.    [provider]  guaiFENesin (MUCINEX) 600 MG 12 hr tablet Take 1 tablet (600 mg total) by mouth 2 (two) times daily. 01/24/22   [name], MD  irbesartan-hydrochlorothiazide (AVALIDE) 300-12.5 MG tablet Take 1 tablet by mouth every morning.     [provider]  lidocaine-prilocaine (EMLA) cream Apply 1 application topically as needed. 12/21/20   [name], MD  metoCLOPramide (REGLAN) 10 MG tablet  Take 1 tablet (10 mg total) by mouth 4 (four) times daily -  before meals and at bedtime. 01/30/22   Pickenpack-Cousar, Carlena Sax, NP  Multiple Vitamins-Minerals (ADULT GUMMY PO) Take 2 capsules by mouth daily.    [provider]  naproxen sodium (ALEVE) 220 MG tablet Take 220 mg by mouth daily as needed (pain).    [provider]  ondansetron (ZOFRAN) 8 MG tablet Take 0.5 tablets (4 mg total) by mouth every 8 (eight) hours as needed for nausea or vomiting. 01/07/22   [name], MD  Oxycodone HCl 10 MG TABS Take 1 tablet (10 mg total) by mouth every 4 (four) hours as needed. 02/15/22   [name], MD  pantoprazole (PROTONIX) 40 MG tablet Take 1 tablet (40 mg total) by mouth daily. 01/30/22   Pickenpack-Cousar, Carlena Sax, NP  prochlorperazine (COMPAZINE) 10 MG tablet TAKE 1 TABLET(10 MG) BY MOUTH EVERY 6 HOURS AS NEEDED FOR NAUSEA OR VOMITING 01/15/22   [name], MD  rosuvastatin (CRESTOR) 20 MG tablet Take 20 mg by mouth daily.    [provider]  scopolamine (TRANSDERM-SCOP) 1 MG/3DAYS Place 1 patch (1.5 mg total) onto the skin every 3 (three) days. 02/15/22   Pickenpack-Cousar, Carlena Sax, NP  triamcinolone ointment (KENALOG) 0.5 % Apply 1 Application topically 2 (two) times daily. 01/24/22   [name], MD      Allergies  Patient has no known allergies.    Review of Systems   Review of Systems  Constitutional:  Negative for chills and fever.  HENT:  Negative for sore throat.   Eyes:  Negative for redness.  Respiratory:  Negative for cough and shortness of breath.   Cardiovascular:  Negative for chest pain.  Gastrointestinal:  Positive for nausea and vomiting. Negative for abdominal pain.  Genitourinary:  Negative for dysuria and flank pain.  Musculoskeletal:  Negative for back pain and neck pain.  Skin:  Negative for rash.  Neurological:  Negative for headaches.  Hematological:  Does not bruise/bleed easily.  Psychiatric/Behavioral:  Negative  for confusion.     Physical Exam Updated Vital Signs BP 137/65   Pulse 92   Temp 98.2 F (36.8 C) (Oral)   Resp 17   Ht 1.727 m ('5\' 8"'$ )   Wt 90.3 kg   SpO2 96%   BMI 30.26 kg/m  Physical Exam Vitals and nursing note reviewed.  Constitutional:      Appearance: Normal appearance. He is well-developed.  HENT:     Head: Atraumatic.     Nose: Nose normal.     Mouth/Throat:     Mouth: Mucous membranes are moist.     Pharynx: Oropharynx is clear.  Eyes:     General: No scleral icterus.    Conjunctiva/sclera: Conjunctivae normal.     Pupils: Pupils are equal, round, and reactive to light.  Neck:     Trachea: No tracheal deviation.  Cardiovascular:     Rate and Rhythm: Normal rate and regular rhythm.     Pulses: Normal pulses.     Heart sounds: Normal heart sounds. No murmur heard.    No friction rub. No gallop.  Pulmonary:     Effort: Pulmonary effort is normal. No accessory muscle usage or respiratory distress.     Breath sounds: Normal breath sounds.  Abdominal:     General: Bowel sounds are normal. There is no distension.     Palpations: Abdomen is soft. There is no mass.     Tenderness: There is no abdominal tenderness.     Comments: No incarcerated hernia.   Genitourinary:    Comments: No cva tenderness. Musculoskeletal:        General: No swelling or tenderness.     Cervical back: Normal range of motion and neck supple. No rigidity.     Right lower leg: No edema.     Left lower leg: No edema.  Skin:    General: Skin is warm and dry.     Findings: No rash.  Neurological:     Mental Status: He is alert.     Comments: Alert, speech clear. Motor/sens grossly intact bil.   Psychiatric:        Mood and Affect: Mood normal.     ED Results / Procedures / Treatments   Labs (all labs ordered are listed, but only abnormal results are displayed) Results for orders placed or performed during the hospital encounter of 02/16/22  Comprehensive metabolic panel  Result  Value Ref Range   Sodium 135 135 - 145 mmol/L   Potassium 3.8 3.5 - 5.1 mmol/L   Chloride 103 98 - 111 mmol/L   CO2 22 22 - 32 mmol/L   Glucose, Bld 116 (H) 70 - 99 mg/dL   BUN 14 8 - 23 mg/dL   Creatinine, Ser 0.68 0.61 - 1.24 mg/dL   Calcium 7.8 (L) 8.9 - 10.3 mg/dL   Total  Protein 6.7 6.5 - 8.1 g/dL   Albumin 2.7 (L) 3.5 - 5.0 g/dL   AST 14 (L) 15 - 41 U/L   ALT 15 0 - 44 U/L   Alkaline Phosphatase 272 (H) 38 - 126 U/L   Total Bilirubin 0.5 0.3 - 1.2 mg/dL   GFR, Estimated >60 >60 mL/min   Anion gap 10 5 - 15  CBC  Result Value Ref Range   WBC 6.8 4.0 - 10.5 K/uL   RBC 3.28 (L) 4.22 - 5.81 MIL/uL   Hemoglobin 9.9 (L) 13.0 - 17.0 g/dL   HCT 30.4 (L) 39.0 - 52.0 %   MCV 92.7 80.0 - 100.0 fL   MCH 30.2 26.0 - 34.0 pg   MCHC 32.6 30.0 - 36.0 g/dL   RDW 14.6 11.5 - 15.5 %   Platelets 154 150 - 400 K/uL   nRBC 0.0 0.0 - 0.2 %  Lipase, blood  Result Value Ref Range   Lipase 33 11 - 51 U/L  Urinalysis, Routine w reflex microscopic Urine, Clean Catch  Result Value Ref Range   Color, Urine YELLOW YELLOW   APPearance CLEAR CLEAR   Specific Gravity, Urine 1.013 1.005 - 1.030   pH 6.0 5.0 - 8.0   Glucose, UA NEGATIVE NEGATIVE mg/dL   Hgb urine dipstick NEGATIVE NEGATIVE   Bilirubin Urine NEGATIVE NEGATIVE   Ketones, ur 5 (A) NEGATIVE mg/dL   Protein, ur NEGATIVE NEGATIVE mg/dL   Nitrite NEGATIVE NEGATIVE   Leukocytes,Ua NEGATIVE NEGATIVE      EKG None  Radiology CT Abdomen Pelvis W Contrast  Result Date: 02/16/2022 CLINICAL DATA:  Bowel obstruction suspected. Intermittent nausea and vomiting. History of metastatic prostate cancer to bone. EXAM: CT ABDOMEN AND PELVIS WITH CONTRAST TECHNIQUE: Multidetector CT imaging of the abdomen and pelvis was performed using the standard protocol following bolus administration of intravenous contrast. RADIATION DOSE REDUCTION: This exam was performed according to the departmental dose-optimization program which includes automated  exposure control, adjustment of the mA and/or kV according to patient size and/or use of iterative reconstruction technique. CONTRAST:  157m OMNIPAQUE IOHEXOL 300 MG/ML  SOLN COMPARISON:  02/02/2015. FINDINGS: Lower chest: Coronary artery calcifications are noted. Atelectasis is noted bilaterally. Hepatobiliary: A subcentimeter hypodensity is present in the left lobe of the liver which is too small to further characterize. No biliary ductal dilatation. The gallbladder is without stones. Pancreas: Unremarkable. No pancreatic ductal dilatation or surrounding inflammatory changes. Spleen: Normal in size without focal abnormality. Adrenals/Urinary Tract: No adrenal nodule or mass. The kidneys enhance symmetrically. Nonobstructive renal calculi bilaterally. Cysts are present bilaterally and there parapelvic cysts on the left. No hydronephrosis. Multiple stones are present in the urinary bladder on the left. Stomach/Bowel: Stomach is within normal limits. Appendix appears normal. No evidence of bowel wall thickening, distention, or inflammatory changes. No free air or pneumatosis. There is a umbilical hernia containing nonobstructed small bowel. Scattered diverticula are present along the colon without evidence of diverticulitis. Vascular/Lymphatic: Aortic atherosclerosis. No enlarged abdominal or pelvic lymph nodes. Reproductive: No abdominopelvic ascites. Other: Prostate gland is normal in size. Fat containing inguinal hernias are present bilaterally. Musculoskeletal: Scattered sclerotic lesions are noted in the bones, compatible with osteoblastic metastasis. Degenerative changes are present in the thoracolumbar spine. No acute fracture. IMPRESSION: 1. No acute intra-abdominal process. 2. Umbilical hernia containing nonobstructed small bowel. 3. Diverticulosis without diverticulitis. 4. Bilateral nephrolithiasis without evidence of obstructive uropathy. 5. Bladder calculi. 6. Findings compatible with osteoblastic  metastatic disease. 7. Aortic atherosclerosis  and coronary artery calcifications. Electronically Signed   By: Brett Fairy M.D.   On: 02/16/2022 22:48    Procedures Procedures    Medications Ordered in ED Medications  lactated ringers bolus 1,000 mL (0 mLs Intravenous Stopped 02/16/22 2028)  HYDROmorphone (DILAUDID) injection 0.5 mg (0.5 mg Intravenous Given 02/16/22 1826)  ondansetron (ZOFRAN) injection 4 mg (4 mg Intravenous Given 02/16/22 1826)  lactated ringers bolus 1,000 mL (1,000 mLs Intravenous New Bag/Given 02/16/22 2028)    ED Course/ Medical Decision Making/ A&P                           Medical Decision Making Problems Addressed: Dehydration: acute illness or injury with systemic symptoms that poses a threat to life or bodily functions Nausea and vomiting in adult: acute illness or injury with systemic symptoms that poses a threat to life or bodily functions Prostate cancer metastatic to bone [facility]): chronic illness or injury with exacerbation, progression, or side effects of treatment that poses a threat to life or bodily functions Umbilical hernia without obstruction and without gangrene: chronic illness or injury  Amount and/or Complexity of Data Reviewed External Data Reviewed: notes. Labs: ordered. Decision-making details documented in ED Course. Radiology: ordered.  Risk Prescription drug management. Decision regarding hospitalization.   Iv ns. Continuous pulse ox and cardiac monitoring. Labs ordered/sent.   Differential diagnosis includes  dehydration, aki, uti, etc. Dispo decision including potential need for admission considered - will get labs and reassess.   Reviewed nursing notes and prior charts for additional history. External reports reviewed.   Cardiac monitor: sinus rhythm, rate 80.  Labs reviewed/interpreted by me - lipase normal. Hct c/w baseline.   LR bolus iv. Dilaudid iv. Zofran iv.   Po fluids/food.   Additional ivf bolus.   Pt w episodes  recurrent nv. Mild abd tenderness. Will get ct imaging.  UA still pending.   Reglan iv. Ivf. Await ua and ct.   CT reviewed/interpreted by me - no obstruction.   Ua neg for uti.  Pt requests med for nerves/stress. Valium 2.5 mg iv. Ivf.   Recheck, no recurrent vomiting. Pt appears stable for d/c.   Rec close pcp f/u.  Return precautions provided.          Final Clinical Impression(s) / ED Diagnoses Final diagnoses:  Nausea and vomiting in adult  Dehydration  Prostate cancer metastatic to bone [facility])    Rx / DC Orders ED Discharge Orders     None

## 2022-02-16 NOTE — ED Triage Notes (Signed)
Pt arrived via POV, c/o nausea and vomiting. Hx of prostate cancer, states he has stopped treatment around 6 wks ago.

## 2022-02-16 NOTE — ED Notes (Signed)
This RN spoke to patient's daughter Lanelle Bal with patient update.

## 2022-02-16 NOTE — Discharge Instructions (Addendum)
It was our pleasure to provide your ER care today - we hope that you feel better.  Drink plenty of fluids/stay well hydrated. Consider supplementing nutrition with Boost, Ensure or other nutritious shake.   Take your nausea medication as need.   Follow up closely with your doctor in the coming week.  Return to ER if worse, new symptoms, fevers, new/severe pain, persistent vomiting, or other concern.   You were given pain/sedating meds in the ER - no driving for the next 6 hours.

## 2022-02-16 NOTE — ED Provider Notes (Addendum)
[facility] DEPT Provider Note   CSN: [id number] Arrival date & time: 02/16/22  1659     History  Chief Complaint  Patient presents with   Emesis    Stephen Collins is a 77 y.o. male.  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 Has fairly consistently been nauseated, with intermittent vomiting in past two days. Emesis not bloody or bilious. No abd distension. No constant and/or focal abd pain. Is having bms and making urine. No dysuria. No chest pain or discomfort. No sob. Does take oxycodone for bone pain. No headache.   The history is provided by the patient and medical records.  Emesis Associated symptoms: no abdominal pain, no chills, no cough, no fever, no headaches and no sore throat        Home Medications Prior to Admission medications   Medication Sig Start Date End Date Taking? Authorizing Provider  abiraterone acetate (ZYTIGA) 250 MG tablet Take 4 tablets (1,000 mg total) by mouth daily. Take on an empty stomach 1 hour before or 2 hours after a meal 10/31/21   [name], MD  buPROPion (WELLBUTRIN XL) 150 MG 24 hr tablet Take 150 mg by mouth daily.    [provider]  CALCIUM PO Take 1,200 mg by mouth daily.    [provider]  cetirizine (ZYRTEC) 10 MG tablet Take 10 mg by mouth daily as needed for allergies.    [provider]  cholecalciferol (VITAMIN D3) 25 MCG (1000 UNIT) tablet Take 1,000 Units by mouth daily.    [provider]  guaiFENesin (MUCINEX) 600 MG 12 hr tablet Take 1 tablet (600 mg total) by mouth 2 (two) times daily. 01/24/22   [name], MD  irbesartan-hydrochlorothiazide (AVALIDE) 300-12.5 MG tablet Take 1 tablet by mouth every morning.     [provider]  lidocaine-prilocaine (EMLA) cream Apply 1 application topically as needed. 12/21/20   [name], MD  metoCLOPramide (REGLAN) 10 MG tablet  Take 1 tablet (10 mg total) by mouth 4 (four) times daily -  before meals and at bedtime. 01/30/22   Pickenpack-Cousar, Carlena Sax, NP  Multiple Vitamins-Minerals (ADULT GUMMY PO) Take 2 capsules by mouth daily.    [provider]  naproxen sodium (ALEVE) 220 MG tablet Take 220 mg by mouth daily as needed (pain).    [provider]  ondansetron (ZOFRAN) 8 MG tablet Take 0.5 tablets (4 mg total) by mouth every 8 (eight) hours as needed for nausea or vomiting. 01/07/22   [name], MD  Oxycodone HCl 10 MG TABS Take 1 tablet (10 mg total) by mouth every 4 (four) hours as needed. 02/15/22   [name], MD  pantoprazole (PROTONIX) 40 MG tablet Take 1 tablet (40 mg total) by mouth daily. 01/30/22   Pickenpack-Cousar, Carlena Sax, NP  prochlorperazine (COMPAZINE) 10 MG tablet TAKE 1 TABLET(10 MG) BY MOUTH EVERY 6 HOURS AS NEEDED FOR NAUSEA OR VOMITING 01/15/22   [name], MD  rosuvastatin (CRESTOR) 20 MG tablet Take 20 mg by mouth daily.    [provider]  scopolamine (TRANSDERM-SCOP) 1 MG/3DAYS Place 1 patch (1.5 mg total) onto the skin every 3 (three) days. 02/15/22   Pickenpack-Cousar, Carlena Sax, NP  triamcinolone ointment (KENALOG) 0.5 % Apply 1 Application topically 2 (two) times daily. 01/24/22   [name], MD      Allergies  Patient has no known allergies.    Review of Systems   Review of Systems  Constitutional:  Negative for chills and fever.  HENT:  Negative for sore throat.   Eyes:  Negative for redness.  Respiratory:  Negative for cough and shortness of breath.   Cardiovascular:  Negative for chest pain.  Gastrointestinal:  Positive for nausea and vomiting. Negative for abdominal pain.  Genitourinary:  Negative for dysuria and flank pain.  Musculoskeletal:  Negative for back pain and neck pain.  Skin:  Negative for rash.  Neurological:  Negative for headaches.  Hematological:  Does not bruise/bleed easily.  Psychiatric/Behavioral:  Negative  for confusion.     Physical Exam Updated Vital Signs BP 137/65   Pulse 92   Temp 98.2 F (36.8 C) (Oral)   Resp 17   Ht 1.727 m ('5\' 8"'$ )   Wt 90.3 kg   SpO2 96%   BMI 30.26 kg/m  Physical Exam Vitals and nursing note reviewed.  Constitutional:      Appearance: Normal appearance. He is well-developed.  HENT:     Head: Atraumatic.     Nose: Nose normal.     Mouth/Throat:     Mouth: Mucous membranes are moist.     Pharynx: Oropharynx is clear.  Eyes:     General: No scleral icterus.    Conjunctiva/sclera: Conjunctivae normal.     Pupils: Pupils are equal, round, and reactive to light.  Neck:     Trachea: No tracheal deviation.  Cardiovascular:     Rate and Rhythm: Normal rate and regular rhythm.     Pulses: Normal pulses.     Heart sounds: Normal heart sounds. No murmur heard.    No friction rub. No gallop.  Pulmonary:     Effort: Pulmonary effort is normal. No accessory muscle usage or respiratory distress.     Breath sounds: Normal breath sounds.  Abdominal:     General: Bowel sounds are normal. There is no distension.     Palpations: Abdomen is soft. There is no mass.     Tenderness: There is no abdominal tenderness.     Comments: No incarcerated hernia.   Genitourinary:    Comments: No cva tenderness. Musculoskeletal:        General: No swelling or tenderness.     Cervical back: Normal range of motion and neck supple. No rigidity.     Right lower leg: No edema.     Left lower leg: No edema.  Skin:    General: Skin is warm and dry.     Findings: No rash.  Neurological:     Mental Status: He is alert.     Comments: Alert, speech clear. Motor/sens grossly intact bil.   Psychiatric:        Mood and Affect: Mood normal.     ED Results / Procedures / Treatments   Labs (all labs ordered are listed, but only abnormal results are displayed) Results for orders placed or performed during the hospital encounter of 02/16/22  Comprehensive metabolic panel  Result  Value Ref Range   Sodium 135 135 - 145 mmol/L   Potassium 3.8 3.5 - 5.1 mmol/L   Chloride 103 98 - 111 mmol/L   CO2 22 22 - 32 mmol/L   Glucose, Bld 116 (H) 70 - 99 mg/dL   BUN 14 8 - 23 mg/dL   Creatinine, Ser 0.68 0.61 - 1.24 mg/dL   Calcium 7.8 (L) 8.9 - 10.3 mg/dL   Total  Protein 6.7 6.5 - 8.1 g/dL   Albumin 2.7 (L) 3.5 - 5.0 g/dL   AST 14 (L) 15 - 41 U/L   ALT 15 0 - 44 U/L   Alkaline Phosphatase 272 (H) 38 - 126 U/L   Total Bilirubin 0.5 0.3 - 1.2 mg/dL   GFR, Estimated >60 >60 mL/min   Anion gap 10 5 - 15  CBC  Result Value Ref Range   WBC 6.8 4.0 - 10.5 K/uL   RBC 3.28 (L) 4.22 - 5.81 MIL/uL   Hemoglobin 9.9 (L) 13.0 - 17.0 g/dL   HCT 30.4 (L) 39.0 - 52.0 %   MCV 92.7 80.0 - 100.0 fL   MCH 30.2 26.0 - 34.0 pg   MCHC 32.6 30.0 - 36.0 g/dL   RDW 14.6 11.5 - 15.5 %   Platelets 154 150 - 400 K/uL   nRBC 0.0 0.0 - 0.2 %  Lipase, blood  Result Value Ref Range   Lipase 33 11 - 51 U/L  Urinalysis, Routine w reflex microscopic Urine, Clean Catch  Result Value Ref Range   Color, Urine YELLOW YELLOW   APPearance CLEAR CLEAR   Specific Gravity, Urine 1.013 1.005 - 1.030   pH 6.0 5.0 - 8.0   Glucose, UA NEGATIVE NEGATIVE mg/dL   Hgb urine dipstick NEGATIVE NEGATIVE   Bilirubin Urine NEGATIVE NEGATIVE   Ketones, ur 5 (A) NEGATIVE mg/dL   Protein, ur NEGATIVE NEGATIVE mg/dL   Nitrite NEGATIVE NEGATIVE   Leukocytes,Ua NEGATIVE NEGATIVE      EKG None  Radiology CT Abdomen Pelvis W Contrast  Result Date: 02/16/2022 CLINICAL DATA:  Bowel obstruction suspected. Intermittent nausea and vomiting. History of metastatic prostate cancer to bone. EXAM: CT ABDOMEN AND PELVIS WITH CONTRAST TECHNIQUE: Multidetector CT imaging of the abdomen and pelvis was performed using the standard protocol following bolus administration of intravenous contrast. RADIATION DOSE REDUCTION: This exam was performed according to the departmental dose-optimization program which includes automated  exposure control, adjustment of the mA and/or kV according to patient size and/or use of iterative reconstruction technique. CONTRAST:  170m OMNIPAQUE IOHEXOL 300 MG/ML  SOLN COMPARISON:  02/02/2015. FINDINGS: Lower chest: Coronary artery calcifications are noted. Atelectasis is noted bilaterally. Hepatobiliary: A subcentimeter hypodensity is present in the left lobe of the liver which is too small to further characterize. No biliary ductal dilatation. The gallbladder is without stones. Pancreas: Unremarkable. No pancreatic ductal dilatation or surrounding inflammatory changes. Spleen: Normal in size without focal abnormality. Adrenals/Urinary Tract: No adrenal nodule or mass. The kidneys enhance symmetrically. Nonobstructive renal calculi bilaterally. Cysts are present bilaterally and there parapelvic cysts on the left. No hydronephrosis. Multiple stones are present in the urinary bladder on the left. Stomach/Bowel: Stomach is within normal limits. Appendix appears normal. No evidence of bowel wall thickening, distention, or inflammatory changes. No free air or pneumatosis. There is a umbilical hernia containing nonobstructed small bowel. Scattered diverticula are present along the colon without evidence of diverticulitis. Vascular/Lymphatic: Aortic atherosclerosis. No enlarged abdominal or pelvic lymph nodes. Reproductive: No abdominopelvic ascites. Other: Prostate gland is normal in size. Fat containing inguinal hernias are present bilaterally. Musculoskeletal: Scattered sclerotic lesions are noted in the bones, compatible with osteoblastic metastasis. Degenerative changes are present in the thoracolumbar spine. No acute fracture. IMPRESSION: 1. No acute intra-abdominal process. 2. Umbilical hernia containing nonobstructed small bowel. 3. Diverticulosis without diverticulitis. 4. Bilateral nephrolithiasis without evidence of obstructive uropathy. 5. Bladder calculi. 6. Findings compatible with osteoblastic  metastatic disease. 7. Aortic atherosclerosis  and coronary artery calcifications. Electronically Signed   By: Brett Fairy M.D.   On: 02/16/2022 22:48    Procedures Procedures    Medications Ordered in ED Medications  lactated ringers bolus 1,000 mL (0 mLs Intravenous Stopped 02/16/22 2028)  HYDROmorphone (DILAUDID) injection 0.5 mg (0.5 mg Intravenous Given 02/16/22 1826)  ondansetron (ZOFRAN) injection 4 mg (4 mg Intravenous Given 02/16/22 1826)  lactated ringers bolus 1,000 mL (1,000 mLs Intravenous New Bag/Given 02/16/22 2028)    ED Course/ Medical Decision Making/ A&P                           Medical Decision Making Problems Addressed: Dehydration: acute illness or injury with systemic symptoms that poses a threat to life or bodily functions Nausea and vomiting in adult: acute illness or injury with systemic symptoms that poses a threat to life or bodily functions Prostate cancer metastatic to bone [facility]): chronic illness or injury with exacerbation, progression, or side effects of treatment that poses a threat to life or bodily functions Umbilical hernia without obstruction and without gangrene: chronic illness or injury  Amount and/or Complexity of Data Reviewed External Data Reviewed: notes. Labs: ordered. Decision-making details documented in ED Course. Radiology: ordered.  Risk Prescription drug management. Decision regarding hospitalization.   Iv ns. Continuous pulse ox and cardiac monitoring. Labs ordered/sent.   Differential diagnosis includes  dehydration, aki, uti, etc. Dispo decision including potential need for admission considered - will get labs and reassess.   Reviewed nursing notes and prior charts for additional history. External reports reviewed.   Cardiac monitor: sinus rhythm, rate 80.  Labs reviewed/interpreted by me - lipase normal. Hct c/w baseline.   LR bolus iv. Dilaudid iv. Zofran iv.   Po fluids/food.   Additional ivf bolus.   Pt w episodes  recurrent nv. Mild abd tenderness. Will get ct imaging.  UA still pending.   Reglan iv. Ivf. Await ua and ct.   CT reviewed/interpreted by me - no obstruction.   Ua neg for uti.  Recheck, no recurrent vomiting. Pt appears stable for d/c.   Rec close pcp f/u.  Return precautions provided.          Final Clinical Impression(s) / ED Diagnoses Final diagnoses:  Nausea and vomiting in adult  Dehydration  Prostate cancer metastatic to bone [facility])    Rx / DC Orders ED Discharge Orders     None           Lajean Saver, MD 02/16/22 2254

## 2022-02-16 NOTE — ED Notes (Signed)
Gave pt a urinal for when he's ready to urinate

## 2022-02-17 MED ORDER — HEPARIN SOD (PORK) LOCK FLUSH 100 UNIT/ML IV SOLN
500.0000 [IU] | Freq: Once | INTRAVENOUS | Status: AC
  Administered 2022-02-17: 500 [IU]
  Filled 2022-02-17: qty 5

## 2022-02-17 NOTE — ED Notes (Signed)
Pt ambulatory to restroom and tolerating fluids at this time

## 2022-02-18 ENCOUNTER — Encounter: Payer: Self-pay | Admitting: Oncology

## 2022-02-18 NOTE — Progress Notes (Signed)
Patient called to inquire about using grant funds from [employer] to cover emergency room bill when it comes. Advised patient grant was for copay for chemotherapy and he may want to contact [employer] regarding grant balance and other uses of the grant. He verbalized understanding.  He has my card for any additional financial questions or concerns.

## 2022-02-20 ENCOUNTER — Telehealth: Payer: Self-pay

## 2022-02-20 ENCOUNTER — Inpatient Hospital Stay: Payer: PPO

## 2022-02-20 ENCOUNTER — Inpatient Hospital Stay (HOSPITAL_BASED_OUTPATIENT_CLINIC_OR_DEPARTMENT_OTHER): Payer: PPO | Admitting: Nurse Practitioner

## 2022-02-20 ENCOUNTER — Other Ambulatory Visit (HOSPITAL_COMMUNITY): Payer: PPO

## 2022-02-20 ENCOUNTER — Encounter: Payer: Self-pay | Admitting: Nurse Practitioner

## 2022-02-20 ENCOUNTER — Other Ambulatory Visit: Payer: Self-pay

## 2022-02-20 VITALS — BP 126/62 | HR 92 | Temp 98.3°F | Resp 18

## 2022-02-20 DIAGNOSIS — R630 Anorexia: Secondary | ICD-10-CM | POA: Diagnosis not present

## 2022-02-20 DIAGNOSIS — C61 Malignant neoplasm of prostate: Secondary | ICD-10-CM | POA: Diagnosis not present

## 2022-02-20 DIAGNOSIS — Z515 Encounter for palliative care: Secondary | ICD-10-CM | POA: Diagnosis not present

## 2022-02-20 DIAGNOSIS — R110 Nausea: Secondary | ICD-10-CM

## 2022-02-20 DIAGNOSIS — G893 Neoplasm related pain (acute) (chronic): Secondary | ICD-10-CM | POA: Diagnosis not present

## 2022-02-20 DIAGNOSIS — C7951 Secondary malignant neoplasm of bone: Secondary | ICD-10-CM | POA: Diagnosis not present

## 2022-02-20 DIAGNOSIS — Z95828 Presence of other vascular implants and grafts: Secondary | ICD-10-CM

## 2022-02-20 MED ORDER — SODIUM CHLORIDE 0.9 % IV SOLN: Freq: Once | INTRAVENOUS | Status: AC

## 2022-02-20 MED ORDER — SODIUM CHLORIDE 0.9% FLUSH
10.0000 mL | Freq: Once | INTRAVENOUS | Status: AC
  Administered 2022-02-20: 10 mL

## 2022-02-20 MED ORDER — HEPARIN SOD (PORK) LOCK FLUSH 100 UNIT/ML IV SOLN
500.0000 [IU] | Freq: Once | INTRAVENOUS | Status: AC
  Administered 2022-02-20: 500 [IU]

## 2022-02-20 NOTE — Patient Instructions (Signed)

## 2022-02-20 NOTE — Progress Notes (Signed)
[place]  [phone] [phone]) [phone]   Name: Stephen Collins Date: 02/20/2022 MRN: [id number]  DOB: [birth year]  Stephen Collins Care Team: [name], MD as PCP - General (Oncology) Katheren Puller, RN as Oncology Nurse Navigator      INTERVAL HISTORY: Stephen Collins is a 77 y.o. male with oncologic medical history including advanced prostate cancer with bone involvement (2022) s/p chemotherapy which he completed 04/2021, radiation to sacral region/L5 (06/2021), right hip and femur (11/2021).  Palliative ask to see for symptom management and goals of care.   SOCIAL HISTORY:     reports that he has been smoking cigarettes. He has a 24.00 pack-year smoking history. He has never used smokeless tobacco. He reports current alcohol use of about 12.0 standard drinks of alcohol per week. He reports that he does not use drugs.  ADVANCE DIRECTIVES:    CODE STATUS:   PAST MEDICAL HISTORY: Past Medical History:  Diagnosis Date   Bilateral renal cysts    Bladder stones    Depression    Family history of colon cancer 11/22/2021   Frequency of urination    History of kidney stones    Hyperlipidemia    Hypertension    OSA on CPAP    Wears glasses     ALLERGIES:  has No Known Allergies.  MEDICATIONS:  Current Outpatient Medications  Medication Sig Dispense Refill   abiraterone acetate (ZYTIGA) 250 MG tablet Take 4 tablets (1,000 mg total) by mouth daily. Take on an empty stomach 1 hour before or 2 hours after a meal 120 tablet 0   buPROPion (WELLBUTRIN XL) 150 MG 24 hr tablet Take 150 mg by mouth daily.     CALCIUM PO Take 1,200 mg by mouth daily.     cetirizine (ZYRTEC) 10 MG tablet Take 10 mg by mouth daily as needed for allergies.     cholecalciferol (VITAMIN D3) 25 MCG (1000 UNIT) tablet Take 1,000 Units by mouth daily.     guaiFENesin (MUCINEX) 600 MG 12 hr tablet Take 1 tablet (600 mg total) by mouth 2 (two) times daily. 28  tablet 1   irbesartan-hydrochlorothiazide (AVALIDE) 300-12.5 MG tablet Take 1 tablet by mouth every morning.      lidocaine-prilocaine (EMLA) cream Apply 1 application topically as needed. 30 g 0   metoCLOPramide (REGLAN) 10 MG tablet Take 1 tablet (10 mg total) by mouth 4 (four) times daily -  before meals and at bedtime. 60 tablet 2   Multiple Vitamins-Minerals (ADULT GUMMY PO) Take 2 capsules by mouth daily.     naproxen sodium (ALEVE) 220 MG tablet Take 220 mg by mouth daily as needed (pain).     ondansetron (ZOFRAN) 8 MG tablet Take 0.5 tablets (4 mg total) by mouth every 8 (eight) hours as needed for nausea or vomiting. 30 tablet 3   Oxycodone HCl 10 MG TABS Take 1 tablet (10 mg total) by mouth every 4 (four) hours as needed. 90 tablet 0   pantoprazole (PROTONIX) 40 MG tablet Take 1 tablet (40 mg total) by mouth daily. 30 tablet 2   prochlorperazine (COMPAZINE) 10 MG tablet TAKE 1 TABLET(10 MG) BY MOUTH EVERY 6 HOURS AS NEEDED FOR NAUSEA OR VOMITING 30 tablet 0   rosuvastatin (CRESTOR) 20 MG tablet Take 20 mg by mouth daily.     scopolamine (TRANSDERM-SCOP) 1 MG/3DAYS Place 1 patch (1.5 mg total) onto the skin every 3 (three) days. 10 patch 12  triamcinolone ointment (KENALOG) 0.5 % Apply 1 Application topically 2 (two) times daily. 45 g 1   No current facility-administered medications for this visit.   Facility-Administered Medications Ordered in Other Visits  Medication Dose Route Frequency Provider Last Rate Last Admin   0.9 %  sodium chloride infusion   Intravenous Once PRN [name], MD       albuterol (PROVENTIL) (2.5 MG/3ML) 0.083% nebulizer solution 2.5 mg  2.5 mg Nebulization Once PRN [name], MD        VITAL SIGNS: There were no vitals taken for this visit. There were no vitals filed for this visit.  Estimated body mass index is 30.26 kg/m as calculated from the following:   Height as of 02/16/22: '5\' 8"'$  (1.727 m).   Weight as of 02/16/22: 199 lb (90.3  kg).   PERFORMANCE STATUS (ECOG) : 1 - Symptomatic but completely ambulatory   IMPRESSION: I saw Stephen Collins during his infusion. He is resting comfortably in recliner. Unfortunately he had to go to ED over the weekend due to his ongoing nausea and vomiting. Seems to feel somewhat better over the past several days but still with 1-2 daily emesis episodes.   He is receiving IVF and IV antiemetics today which he is appreciative of.   Pain is controlled with as needed oxycodone. He is taking 2-3 times daily.   We will plan to see Stephen Collins back on next week with possible infusion for support.    1/5: We discussed Stephen Collins current illness and disease trajectory. He is realistic in his understanding. His quality of life means the most to him. He does not wish to suffer and hopeful all symptoms can be managed allowing him every opportunity to comfortably thrive.   Education provided on hospice as requested by Stephen Collins. Stephen Collins asked appropriate questions regarding what care would look like in the home and residential hospice as well as expectations in the future. All questions answered and support provided.    PLAN: Reglan 10 mg AC/HS  Protonix 40 mg daily  Continue Zofran and Compazine as needed Scopolamine patch for nausea. 1L IVF, IV zofran, and IV pepcid today and will also plan to schedule Stephen Collins for one day next week with close follow-up.  Miralax daily  We will continue to monitor symptoms and support as needed.  I will plan to see Stephen Collins back in 1-2 weeks in collaboration to other oncology appointments. Stephen Collins knows to contact office sooner if needed.    Stephen Collins expressed understanding and was in agreement with this plan. He also understands that He can call the clinic at any time with any questions, concerns, or complaints.    Time Total: 20 min   Visit consisted of counseling and education dealing with the complex and emotionally intense issues of symptom management and  palliative care in the setting of serious and potentially life-threatening illness.Greater than 50%  of this time was spent counseling and coordinating care related to the above assessment and plan.  Alda Lea, AGPCNP-BC  Palliative Medicine Team/[employer]

## 2022-02-20 NOTE — Progress Notes (Signed)
Patient denies any nausea today and declines antiemetics at this visit.

## 2022-02-20 NOTE — Telephone Encounter (Signed)
        Patient  visited [facility] on 02/17/2022  for Nausea and vomiting in adult.   Telephone encounter attempt :  1st  A HIPAA compliant voice message was left requesting a return call.  Instructed patient to call back at [phone].   [facility] Resource Care Guide   [email or web address]  ?? [phone]   Website: [email or web address]  [email or web address]

## 2022-02-20 NOTE — Progress Notes (Signed)
Orders placed for next infusion appt. Pt to receive 1L NS over 1 hour, ok per Lexine Baton, NP, to run zofran, pepcid, and dex (individually, not together), on a secondary line during the hour with the NS. Scheduling message sent.

## 2022-02-21 ENCOUNTER — Inpatient Hospital Stay: Payer: PPO | Admitting: Dietician

## 2022-02-21 NOTE — Progress Notes (Signed)
Nutrition Assessment   Reason for Assessment: Palliative referral    ASSESSMENT: 77 year old male with prostate cancer metastatic to bone. He completed chemotherapy in March 2023. Patient is s/p palliative radiation to sacral region, right hip and femur. Patient received first Pluvicto 01/01/22. This has been discontinued due to progression. Patient currently receiving supportive care. He is under the care of Dr. Alen Blew.   Spoke with patient via telephone. He reports nausea and vomiting have significantly improved. He had one episode of vomiting yesterday (1/10). Patient endorses nausea this morning. He says appetite is "not good." Patient recalls typically eating 2 small meals. He has eggs and a biscuit for breakfast and chicken broth or soup for dinner. Last night he had a few spoonfuls of clam chowder and one piece of sushi. Patient reports he loses taste/interest in foods quickly. He has yet to find a food that appeals to him. Patient is drinking 2% milk and tolerates well. He states he is not drinking enough water. His daughter sent him hydration packets to mix with water. He does not like this. Patient no longer has a taste for coffee. He is enjoying hot tea.   Nutrition Focused Physical Exam: unable to complete (telephone visit)   Medications: reglan, zofran, oxycodone, protonix, compazine, scopolamine   Labs: 1/6 - glucose 116, albumin 2.7, corrected Ca 8.4, Hgb 9.9   Anthropometrics: Weights have decreased 7% (15 lbs) in 7 weeks - severe for time frame. Per chart, he weighed 214 lb 1.6 oz on 12/24/21  Height: 5'8" Weight: 199 lb (1/5) UBW: 220 lb (03/14/21) BMI: 30.26   NUTRITION DIAGNOSIS: Unintentional weight loss related to metastatic prostate cancer as evidenced by 7% wt loss in 7 weeks - severe   INTERVENTION:  Discussed strategies for increasing calories and protein, encouraged small frequent meals/snacks q2h (switching to whole milk, adding cheese, extra butter/sauce,  high calorie shakes, having bedtime snack) Discussed foods with protein, encouraged source of protein with all meals/snacks  Suggested pt try CIB powder mixed with whole milk for alternate supplement idea - recommend drinking 3x/day as tolerated Encouraged regular diet, high calorie/high protein foods as tolerated/desired Patient receiving supportive care with IV hydration + antiemetics  Will mail snack ideas, soft moist high protein foods, high calorie shake recipes per pt request Contact information provided   MONITORING, EVALUATION, GOAL: Pt will tolerate increased calories and protein to minimize further wt loss    Next Visit: To be scheduled as needed

## 2022-02-23 ENCOUNTER — Other Ambulatory Visit: Payer: Self-pay | Admitting: Nurse Practitioner

## 2022-02-23 DIAGNOSIS — C61 Malignant neoplasm of prostate: Secondary | ICD-10-CM

## 2022-02-23 DIAGNOSIS — Z515 Encounter for palliative care: Secondary | ICD-10-CM

## 2022-02-23 DIAGNOSIS — F419 Anxiety disorder, unspecified: Secondary | ICD-10-CM

## 2022-02-23 DIAGNOSIS — R110 Nausea: Secondary | ICD-10-CM

## 2022-02-23 MED ORDER — LORAZEPAM 0.5 MG PO TABS: 0.5000 mg | ORAL_TABLET | Freq: Four times a day (QID) | ORAL | 0 refills | Status: AC | PRN

## 2022-02-23 MED ORDER — DEXAMETHASONE 4 MG PO TABS: 4.0000 mg | ORAL_TABLET | Freq: Every day | ORAL | 0 refills | Status: AC

## 2022-02-25 ENCOUNTER — Other Ambulatory Visit: Payer: Self-pay

## 2022-02-25 ENCOUNTER — Inpatient Hospital Stay: Payer: PPO

## 2022-02-25 ENCOUNTER — Inpatient Hospital Stay (HOSPITAL_BASED_OUTPATIENT_CLINIC_OR_DEPARTMENT_OTHER): Payer: PPO | Admitting: Nurse Practitioner

## 2022-02-25 VITALS — BP 137/78 | HR 92 | Temp 98.4°F | Resp 20

## 2022-02-25 DIAGNOSIS — C7951 Secondary malignant neoplasm of bone: Secondary | ICD-10-CM

## 2022-02-25 DIAGNOSIS — R110 Nausea: Secondary | ICD-10-CM | POA: Diagnosis not present

## 2022-02-25 DIAGNOSIS — R630 Anorexia: Secondary | ICD-10-CM | POA: Diagnosis not present

## 2022-02-25 DIAGNOSIS — C61 Malignant neoplasm of prostate: Secondary | ICD-10-CM

## 2022-02-25 DIAGNOSIS — R634 Abnormal weight loss: Secondary | ICD-10-CM

## 2022-02-25 DIAGNOSIS — Z515 Encounter for palliative care: Secondary | ICD-10-CM

## 2022-02-25 DIAGNOSIS — G893 Neoplasm related pain (acute) (chronic): Secondary | ICD-10-CM | POA: Diagnosis not present

## 2022-02-25 DIAGNOSIS — Z95828 Presence of other vascular implants and grafts: Secondary | ICD-10-CM

## 2022-02-25 DIAGNOSIS — Z7189 Other specified counseling: Secondary | ICD-10-CM

## 2022-02-25 DIAGNOSIS — K5900 Constipation, unspecified: Secondary | ICD-10-CM

## 2022-02-25 MED ORDER — SODIUM CHLORIDE 0.9 % IV SOLN: Freq: Once | INTRAVENOUS | Status: AC

## 2022-02-25 MED ORDER — FENTANYL 12 MCG/HR TD PT72: 1.0000 | MEDICATED_PATCH | TRANSDERMAL | 0 refills | Status: AC

## 2022-02-25 MED ORDER — SODIUM CHLORIDE 0.9% FLUSH
10.0000 mL | Freq: Once | INTRAVENOUS | Status: AC
  Administered 2022-02-25: 10 mL

## 2022-02-25 MED ORDER — HEPARIN SOD (PORK) LOCK FLUSH 100 UNIT/ML IV SOLN
500.0000 [IU] | Freq: Once | INTRAVENOUS | Status: AC
  Administered 2022-02-25: 500 [IU]

## 2022-02-25 MED ORDER — ONDANSETRON HCL 4 MG/2ML IJ SOLN
4.0000 mg | Freq: Once | INTRAMUSCULAR | Status: AC
  Administered 2022-02-25: 4 mg via INTRAVENOUS
  Filled 2022-02-25: qty 2

## 2022-02-25 MED ORDER — PROMETHAZINE HCL 12.5 MG PO TABS: 12.5000 mg | ORAL_TABLET | Freq: Four times a day (QID) | ORAL | 0 refills | Status: AC | PRN

## 2022-02-25 MED ORDER — SENNOSIDES-DOCUSATE SODIUM 8.6-50 MG PO TABS: 2.0000 | ORAL_TABLET | Freq: Every day | ORAL | 2 refills | Status: AC

## 2022-02-25 MED ORDER — FENTANYL 12 MCG/HR TD PT72: 1.0000 | MEDICATED_PATCH | TRANSDERMAL | 0 refills | Status: DC

## 2022-02-25 MED ORDER — SODIUM CHLORIDE 0.9 % IV SOLN
10.0000 mg | Freq: Once | INTRAVENOUS | Status: AC
  Administered 2022-02-25: 10 mg via INTRAVENOUS
  Filled 2022-02-25: qty 10

## 2022-02-25 MED ORDER — FAMOTIDINE IN NACL 20-0.9 MG/50ML-% IV SOLN
20.0000 mg | Freq: Once | INTRAVENOUS | Status: AC
  Administered 2022-02-25: 20 mg via INTRAVENOUS
  Filled 2022-02-25: qty 50

## 2022-02-25 NOTE — Progress Notes (Unsigned)
[facility], Webb Silversmith, RN, contacted as liaison, family aware of hospice orders, no further needs at this time.

## 2022-02-25 NOTE — Progress Notes (Unsigned)
[place]  [phone] [phone]) [phone]   Name: Stephen Collins Date: 02/25/2022 MRN: [id number]  DOB: June 08, 1945  Patient Care Team: [name], MD as PCP - General (Oncology) Katheren Puller, RN as Oncology Nurse Navigator      INTERVAL HISTORY: Stephen Collins is a 77 y.o. male with oncologic medical history including advanced prostate cancer with bone involvement (2022) s/p chemotherapy which he completed 04/2021, radiation to sacral region/L5 (06/2021), right hip and femur (11/2021).  Palliative ask to see for symptom management and goals of care.   SOCIAL HISTORY:     reports that he has been smoking cigarettes. He has a 24.00 pack-year smoking history. He has never used smokeless tobacco. He reports current alcohol use of about 12.0 standard drinks of alcohol per week. He reports that he does not use drugs.  ADVANCE DIRECTIVES:    CODE STATUS:   PAST MEDICAL HISTORY: Past Medical History:  Diagnosis Date   Bilateral renal cysts    Bladder stones    Depression    Family history of colon cancer 11/22/2021   Frequency of urination    History of kidney stones    Hyperlipidemia    Hypertension    OSA on CPAP    Wears glasses     ALLERGIES:  has No Known Allergies.  MEDICATIONS:  Current Outpatient Medications  Medication Sig Dispense Refill   abiraterone acetate (ZYTIGA) 250 MG tablet Take 4 tablets (1,000 mg total) by mouth daily. Take on an empty stomach 1 hour before or 2 hours after a meal 120 tablet 0   buPROPion (WELLBUTRIN XL) 150 MG 24 hr tablet Take 150 mg by mouth daily.     CALCIUM PO Take 1,200 mg by mouth daily.     cetirizine (ZYRTEC) 10 MG tablet Take 10 mg by mouth daily as needed for allergies.     cholecalciferol (VITAMIN D3) 25 MCG (1000 UNIT) tablet Take 1,000 Units by mouth daily.     dexamethasone (DECADRON) 4 MG tablet Take 1 tablet (4 mg total) by mouth daily. 30 tablet 0   guaiFENesin  (MUCINEX) 600 MG 12 hr tablet Take 1 tablet (600 mg total) by mouth 2 (two) times daily. 28 tablet 1   irbesartan-hydrochlorothiazide (AVALIDE) 300-12.5 MG tablet Take 1 tablet by mouth every morning.      lidocaine-prilocaine (EMLA) cream Apply 1 application topically as needed. 30 g 0   LORazepam (ATIVAN) 0.5 MG tablet Take 1 tablet (0.5 mg total) by mouth every 6 (six) hours as needed for anxiety (nausea). 90 tablet 0   metoCLOPramide (REGLAN) 10 MG tablet Take 1 tablet (10 mg total) by mouth 4 (four) times daily -  before meals and at bedtime. 60 tablet 2   Multiple Vitamins-Minerals (ADULT GUMMY PO) Take 2 capsules by mouth daily.     naproxen sodium (ALEVE) 220 MG tablet Take 220 mg by mouth daily as needed (pain).     ondansetron (ZOFRAN) 8 MG tablet Take 0.5 tablets (4 mg total) by mouth every 8 (eight) hours as needed for nausea or vomiting. 30 tablet 3   Oxycodone HCl 10 MG TABS Take 1 tablet (10 mg total) by mouth every 4 (four) hours as needed. 90 tablet 0   pantoprazole (PROTONIX) 40 MG tablet Take 1 tablet (40 mg total) by mouth daily. 30 tablet 2   prochlorperazine (COMPAZINE) 10 MG tablet TAKE 1 TABLET(10 MG) BY MOUTH EVERY 6 HOURS AS  NEEDED FOR NAUSEA OR VOMITING 30 tablet 0   rosuvastatin (CRESTOR) 20 MG tablet Take 20 mg by mouth daily.     scopolamine (TRANSDERM-SCOP) 1 MG/3DAYS Place 1 patch (1.5 mg total) onto the skin every 3 (three) days. 10 patch 12   triamcinolone ointment (KENALOG) 0.5 % Apply 1 Application topically 2 (two) times daily. 45 g 1   No current facility-administered medications for this visit.   Facility-Administered Medications Ordered in Other Visits  Medication Dose Route Frequency Provider Last Rate Last Admin   0.9 %  sodium chloride infusion   Intravenous Once PRN [name], MD       albuterol (PROVENTIL) (2.5 MG/3ML) 0.083% nebulizer solution 2.5 mg  2.5 mg Nebulization Once PRN [name], MD        VITAL SIGNS: There were no vitals  taken for this visit. There were no vitals filed for this visit.  Estimated body mass index is 30.26 kg/m as calculated from the following:   Height as of 02/16/22: '5\' 8"'$  (1.727 m).   Weight as of 02/16/22: 199 lb (90.3 kg).   PERFORMANCE STATUS (ECOG) : 1 - Symptomatic but completely ambulatory   IMPRESSION:  Stephen Collins presents to clinic today for symptom follow-up. He received anti-emetics and IVF in infusion for additional support. Patient's daughter, Lanelle Bal is also present for visit today.   As requested patient and family wanted to have extensive goals of care discussions. Daughter is tearful expressing patient's ongoing decline which seems to be rapidly progressing. Patient has been increased fatigue, minimal appetite, constant nausea and vomiting, now with some generalized pain. He has been taking anti-emetics at home around the clock with minimal relief. Daughter reports he throws up several times a day. Appetite is poor with only sips and bites over the past 2-3 weeks. Stephen Collins reports no appetite or desire to eat. Weight is down to 198lbs from 215lbs on 11/13.  We discussed at length patient's current illness and disease trajectory.  He is realistic in his understanding as well as his family.  Shares as he has previously that his quality of life means most to him and at this time he feels that this is very poor.  He is not interested in pursuing aggressive treatment however is interested in aggressive symptom management to relieve any pain and suffering.  Patient and family are inquiring about hospice services both inpatient and outpatient.  Extensive education provided on hospice's philosophy and goals of care.  We also discussed aggressive symptom management.  Patient and family wishes to enroll in outpatient hospice at this time with plans on transitioning to residential hospice at appropriate time.  Education provided on referral process.  We also discussed symptom management.  Given  patient's increasing pain and ongoing antiemetics we will start him on fentanyl patch, Phenergan, and lorazepam.  This will be an in addition to his other regimen which includes scopolamine patch, Zofran, Compazine.  Patient and family verbalized appreciation.  No home equipment needs at this time.  Referral has been placed with [facility] as requested by patient and family.[place], RN has communicated with Carmel [place], RN (Liaison). Hospice is making arrangements with plans to go out to patient's home later today and admit him under their services. Patient and family aware and appreciative.   All questions answered and support provided.   PLAN: Reglan 10 mg AC/HS  Protonix 40 mg daily  Continue Zofran and Compazine as needed Scopolamine patch for nausea. 1L  IVF, IV zofran, and IV pepcid today Phenergan 12.5 mg every 6 hours as needed for nausea  Lorazepam 0.5 mg as needed for nausea/anxiety Fentanyl 12 mcg patch Miralax daily  Referral placed for [facility].  Extensive discussions with patient and family. Patient would like to focus solely on his comfort. He is realistic in his understanding of disease and prognosis.    Patient expressed understanding and was in agreement with this plan. He also understands that He can call the clinic at any time with any questions, concerns, or complaints.     Any controlled substances utilized were prescribed in the context of palliative care. PDMP has been reviewed.    Time Total: 55 min   Visit consisted of counseling and education dealing with the complex and emotionally intense issues of symptom management and palliative care in the setting of serious and potentially life-threatening illness.Greater than 50%  of this time was spent counseling and coordinating care related to the above assessment and plan.  Alda Lea, AGPCNP-BC  Palliative Medicine Team/[facility]

## 2022-02-25 NOTE — Progress Notes (Signed)
   Referral for hospice services at home received from [facility]. The pt was worked up and has been approved by our MD for hospice care at home. Plan to enroll pt this evening to assist with sx management needs. If unable to get comfortable in homecare setting will look at hospice facility for sx management. Pt states he is not receiving anymore chemotherapy or other treatments for prostate cancer. Plan for nurse to make visit this evening 600pm at pt's home  to enroll into hospice care.   Webb Silversmith RN [phone]

## 2022-02-25 NOTE — Patient Instructions (Signed)
Dehydration, Adult Dehydration is a condition in which there is not enough water or other fluids in the body. This happens when a person loses more fluids than he or she takes in. Important organs, such as the kidneys, brain, and heart, cannot function without a proper amount of fluids. Any loss of fluids from the body can lead to dehydration. Dehydration can be mild, moderate, or severe. It should be treated right away to prevent it from becoming severe. What are the causes? Dehydration may be caused by: Conditions that cause loss of water or other fluids, such as diarrhea, vomiting, or sweating or urinating a lot. Not drinking enough fluids, especially when you are ill or doing activities that require a lot of energy. Other illnesses and conditions, such as fever or infection. Certain medicines, such as medicines that remove excess fluid from the body (diuretics). Lack of safe drinking water. Not being able to get enough water and food. What increases the risk? The following factors may make you more likely to develop this condition: Having a long-term (chronic) illness that has not been treated properly, such as diabetes, heart disease, or kidney disease. Being 65 years of age or older. Having a disability. Living in a place that is high in altitude, where thinner, drier air causes more fluid loss. Doing exercises that put stress on your body for a long time (endurance sports). What are the signs or symptoms? Symptoms of dehydration depend on how severe it is. Mild or moderate dehydration Thirst. Dry lips or dry mouth. Dizziness or light-headedness, especially when standing up from a seated position. Muscle cramps. Dark urine. Urine may be the color of tea. Less urine or tears produced than usual. Headache. Severe dehydration Changes in skin. Your skin may be cold and clammy, blotchy, or pale. Your skin also may not return to normal after being lightly pinched and released. Little or  no tears, urine, or sweat. Changes in vital signs, such as rapid breathing and low blood pressure. Your pulse may be weak or may be faster than 100 beats a minute when you are sitting still. Other changes, such as: Feeling very thirsty. Sunken eyes. Cold hands and feet. Confusion. Being very tired (lethargic) or having trouble waking from sleep. Short-term weight loss. Loss of consciousness. How is this diagnosed? This condition is diagnosed based on your symptoms and a physical exam. You may have blood and urine tests to help confirm the diagnosis. How is this treated? Treatment for this condition depends on how severe it is. Treatment should be started right away. Do not wait until dehydration becomes severe. Severe dehydration is an emergency and needs to be treated in a hospital. Mild or moderate dehydration can be treated at home. You may be asked to: Drink more fluids. Drink an oral rehydration solution (ORS). This drink helps restore proper amounts of fluids and salts and minerals in the blood (electrolytes). Severe dehydration can be treated: With IV fluids. By correcting abnormal levels of electrolytes. This is often done by giving electrolytes through a tube that is passed through your nose and into your stomach (nasogastric tube, or NG tube). By treating the underlying cause of dehydration. Follow these instructions at home: Oral rehydration solution If told by your health care provider, drink an ORS: Make an ORS by following instructions on the package. Start by drinking small amounts, about  cup (120 mL) every 5-10 minutes. Slowly increase how much you drink until you have taken the amount recommended by your health   care provider. Eating and drinking        Drink enough clear fluid to keep your urine pale yellow. If you were told to drink an ORS, finish the ORS first and then start slowly drinking other clear fluids. Drink fluids such as: Water. Do not drink only  water. Doing that can lead to hyponatremia, which is having too little salt (sodium) in the body. Water from ice chips you suck on. Fruit juice that you have added water to (diluted fruit juice). Low-calorie sports drinks. Eat foods that contain a healthy balance of electrolytes, such as bananas, oranges, potatoes, tomatoes, and spinach. Do not drink alcohol. Avoid the following: Drinks that contain a lot of sugar. These include high-calorie sports drinks, fruit juice that is not diluted, and soda. Caffeine. Foods that are greasy or contain a lot of fat or sugar. General instructions Take over-the-counter and prescription medicines only as told by your health care provider. Do not take sodium tablets. Doing that can lead to having too much sodium in the body (hypernatremia). Return to your normal activities as told by your health care provider. Ask your health care provider what activities are safe for you. Keep all follow-up visits as told by your health care provider. This is important. Contact a health care provider if: You have muscle cramps, pain, or discomfort, such as: Pain in your abdomen and the pain gets worse or stays in one area (localizes). Stiff neck. You have a rash. You are more irritable than usual. You are sleepier or have a harder time waking than usual. You feel weak or dizzy. You feel very thirsty. Get help right away if you have: Any symptoms of severe dehydration. Symptoms of vomiting, such as: You cannot eat or drink without vomiting. Vomiting gets worse or does not go away. Vomit includes blood or green matter (bile). Symptoms that get worse with treatment. A fever. A severe headache. Problems with urination or bowel movements, such as: Diarrhea that gets worse or does not go away. Blood in your stool (feces). This may cause stool to look black and tarry. Not urinating, or urinating only a small amount of very dark urine, within 6-8 hours. Trouble  breathing. These symptoms may represent a serious problem that is an emergency. Do not wait to see if the symptoms will go away. Get medical help right away. Call your local emergency services (911 in the [place] Do not drive yourself to the hospital. Summary Dehydration is a condition in which there is not enough water or other fluids in the body. This happens when a person loses more fluids than he or she takes in. Treatment for this condition depends on how severe it is. Treatment should be started right away. Do not wait until dehydration becomes severe. Drink enough clear fluid to keep your urine pale yellow. If you were told to drink an oral rehydration solution (ORS), finish the ORS first and then start slowly drinking other clear fluids. Take over-the-counter and prescription medicines only as told by your health care provider. Get help right away if you have any symptoms of severe dehydration. This information is not intended to replace advice given to you by your health care provider. Make sure you discuss any questions you have with your health care provider. Document Revised: 06/06/2021 Document Reviewed: 09/10/2018 Elsevier Patient Education  2023 Elsevier Inc.  

## 2022-02-26 ENCOUNTER — Encounter: Payer: Self-pay | Admitting: Nurse Practitioner

## 2022-02-26 ENCOUNTER — Encounter: Payer: Self-pay | Admitting: Oncology

## 2022-02-28 ENCOUNTER — Telehealth: Payer: Self-pay

## 2022-02-28 NOTE — Telephone Encounter (Signed)
        Patient  visited [facility] on 02/17/2022  for Nausea and vomiting.   Telephone encounter attempt :  2nd  A HIPAA compliant voice message was left requesting a return call.  Instructed patient to call back at [phone].   [place] Resource Care Guide   ??[email or web address]  ?? [phone]   Website: [email or web address]  [email or web address]

## 2022-03-01 ENCOUNTER — Telehealth: Payer: Self-pay

## 2022-03-01 NOTE — Telephone Encounter (Signed)
        Patient  visited [facility] on 02/17/2022  for Nausea and vomiting.   Telephone encounter attempt :  3rd  A HIPAA compliant voice message was left requesting a return call.  Instructed patient to call back at [phone].   [place] Resource Care Guide   ??[email or web address]  ?? [phone]   Website: [email or web address]  [email or web address]

## 2022-03-11 ENCOUNTER — Ambulatory Visit: Payer: PPO | Admitting: Hematology

## 2022-03-11 ENCOUNTER — Other Ambulatory Visit: Payer: PPO

## 2022-03-15 NOTE — Progress Notes (Signed)
FMLA Forms completed for Daughter Jone Baseman as requested by Patient

## 2022-03-25 ENCOUNTER — Other Ambulatory Visit: Payer: PPO

## 2022-04-03 ENCOUNTER — Other Ambulatory Visit (HOSPITAL_COMMUNITY): Payer: PPO

## 2022-04-12 DEATH — deceased

## 2022-05-06 ENCOUNTER — Other Ambulatory Visit: Payer: PPO

## 2022-05-15 ENCOUNTER — Other Ambulatory Visit (HOSPITAL_COMMUNITY): Payer: PPO

## 2022-06-14 NOTE — Progress Notes (Signed)
  Radiation Oncology         [phone] ________________________________  Name: Stephen Collins MRN: [id number]  Date: 11/20/2021  DOB: [birth year]  End of Treatment Note  Diagnosis:   77 y.o. 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   Beams/energy:   A 3D field set-up was employed with 6 MV X-rays  Narrative: The patient tolerated radiation treatment relatively well with only modest fatigue.  Plan: The patient has completed radiation treatment. The patient will return to radiation oncology clinic for routine followup in one month. I advised him to call or return sooner if he has any questions or concerns related to his recovery or treatment. ________________________________  Artist Pais. Kathrynn Running, M.D.

## 2022-06-17 ENCOUNTER — Other Ambulatory Visit: Payer: PPO

## 2022-06-26 ENCOUNTER — Other Ambulatory Visit (HOSPITAL_COMMUNITY): Payer: PPO

## 2022-07-29 ENCOUNTER — Other Ambulatory Visit: Payer: PPO

## 2022-08-07 ENCOUNTER — Other Ambulatory Visit (HOSPITAL_COMMUNITY): Payer: PPO
